# Patient Record
Sex: Female | Born: 1935
Health system: Southern US, Community
[De-identification: ages and names within clinical notes are randomized; demographics above are authoritative.]

## PROBLEM LIST (undated history)

## (undated) DIAGNOSIS — Z9981 Dependence on supplemental oxygen: Secondary | ICD-10-CM

## (undated) DIAGNOSIS — I447 Left bundle-branch block, unspecified: Secondary | ICD-10-CM

## (undated) DIAGNOSIS — M47812 Spondylosis without myelopathy or radiculopathy, cervical region: Secondary | ICD-10-CM

## (undated) DIAGNOSIS — K219 Gastro-esophageal reflux disease without esophagitis: Secondary | ICD-10-CM

## (undated) DIAGNOSIS — E785 Hyperlipidemia, unspecified: Secondary | ICD-10-CM

## (undated) DIAGNOSIS — E669 Obesity, unspecified: Secondary | ICD-10-CM

## (undated) DIAGNOSIS — I509 Heart failure, unspecified: Secondary | ICD-10-CM

## (undated) DIAGNOSIS — F419 Anxiety disorder, unspecified: Secondary | ICD-10-CM

## (undated) DIAGNOSIS — I2510 Atherosclerotic heart disease of native coronary artery without angina pectoris: Secondary | ICD-10-CM

## (undated) DIAGNOSIS — G43909 Migraine, unspecified, not intractable, without status migrainosus: Secondary | ICD-10-CM

## (undated) DIAGNOSIS — N183 Chronic kidney disease, stage 3 unspecified: Secondary | ICD-10-CM

## (undated) DIAGNOSIS — I10 Essential (primary) hypertension: Secondary | ICD-10-CM

## (undated) DIAGNOSIS — G4730 Sleep apnea, unspecified: Secondary | ICD-10-CM

## (undated) DIAGNOSIS — Z8719 Personal history of other diseases of the digestive system: Secondary | ICD-10-CM

## (undated) DIAGNOSIS — R001 Bradycardia, unspecified: Secondary | ICD-10-CM

## (undated) HISTORY — PX: CYSTOCELE REPAIR: SHX163

## (undated) HISTORY — PX: CATARACT EXTRACTION W/ INTRAOCULAR LENS  IMPLANT, BILATERAL: SHX1307

## (undated) HISTORY — PX: ABDOMINAL HYSTERECTOMY: SHX81

## (undated) SURGERY — Surgical Case
Anesthesia: *Unknown

---

## 2000-11-16 ENCOUNTER — Other Ambulatory Visit: Admission: RE | Admit: 2000-11-16 | Discharge: 2000-11-16 | Payer: Self-pay | Admitting: Gynecology

## 2002-07-15 ENCOUNTER — Ambulatory Visit (HOSPITAL_COMMUNITY): Admission: RE | Admit: 2002-07-15 | Discharge: 2002-07-16 | Payer: Self-pay | Admitting: Cardiology

## 2002-07-15 ENCOUNTER — Encounter: Payer: Self-pay | Admitting: Cardiology

## 2003-01-16 ENCOUNTER — Ambulatory Visit (HOSPITAL_COMMUNITY): Admission: RE | Admit: 2003-01-16 | Discharge: 2003-01-16 | Payer: Self-pay | Admitting: Cardiology

## 2003-01-16 ENCOUNTER — Encounter: Payer: Self-pay | Admitting: Cardiology

## 2003-01-20 ENCOUNTER — Encounter: Payer: Self-pay | Admitting: Cardiology

## 2003-01-20 ENCOUNTER — Ambulatory Visit (HOSPITAL_COMMUNITY): Admission: RE | Admit: 2003-01-20 | Discharge: 2003-01-20 | Payer: Self-pay | Admitting: Cardiology

## 2003-08-14 ENCOUNTER — Emergency Department (HOSPITAL_COMMUNITY): Admission: AD | Admit: 2003-08-14 | Discharge: 2003-08-14 | Payer: Self-pay | Admitting: Emergency Medicine

## 2003-08-14 ENCOUNTER — Encounter: Payer: Self-pay | Admitting: Emergency Medicine

## 2003-08-22 ENCOUNTER — Emergency Department (HOSPITAL_COMMUNITY): Admission: EM | Admit: 2003-08-22 | Discharge: 2003-08-22 | Payer: Self-pay | Admitting: Emergency Medicine

## 2005-05-12 ENCOUNTER — Other Ambulatory Visit: Admission: RE | Admit: 2005-05-12 | Discharge: 2005-05-12 | Payer: Self-pay | Admitting: Gynecology

## 2005-09-05 ENCOUNTER — Inpatient Hospital Stay (HOSPITAL_COMMUNITY): Admission: RE | Admit: 2005-09-05 | Discharge: 2005-09-07 | Payer: Self-pay | Admitting: Gynecology

## 2007-09-25 ENCOUNTER — Other Ambulatory Visit: Admission: RE | Admit: 2007-09-25 | Discharge: 2007-09-25 | Payer: Self-pay | Admitting: Gynecology

## 2009-04-14 ENCOUNTER — Ambulatory Visit: Payer: Self-pay | Admitting: Nurse Practitioner

## 2009-09-07 ENCOUNTER — Emergency Department (HOSPITAL_COMMUNITY): Admission: EM | Admit: 2009-09-07 | Discharge: 2009-09-07 | Payer: Self-pay | Admitting: Emergency Medicine

## 2009-09-12 ENCOUNTER — Encounter: Admission: RE | Admit: 2009-09-12 | Discharge: 2009-09-12 | Payer: Self-pay | Admitting: Neurology

## 2010-01-05 ENCOUNTER — Encounter: Admission: RE | Admit: 2010-01-05 | Discharge: 2010-01-05 | Payer: Self-pay | Admitting: Neurosurgery

## 2011-01-19 ENCOUNTER — Encounter: Payer: Self-pay | Admitting: Family Medicine

## 2011-01-27 ENCOUNTER — Ambulatory Visit: Payer: Self-pay | Admitting: Internal Medicine

## 2011-02-14 ENCOUNTER — Encounter: Payer: Self-pay | Admitting: Internal Medicine

## 2011-02-14 ENCOUNTER — Ambulatory Visit (INDEPENDENT_AMBULATORY_CARE_PROVIDER_SITE_OTHER): Payer: No Typology Code available for payment source | Admitting: Internal Medicine

## 2011-02-14 DIAGNOSIS — I2510 Atherosclerotic heart disease of native coronary artery without angina pectoris: Secondary | ICD-10-CM

## 2011-02-14 DIAGNOSIS — I10 Essential (primary) hypertension: Secondary | ICD-10-CM

## 2011-02-14 DIAGNOSIS — E785 Hyperlipidemia, unspecified: Secondary | ICD-10-CM

## 2011-02-14 DIAGNOSIS — F411 Generalized anxiety disorder: Secondary | ICD-10-CM

## 2011-02-14 DIAGNOSIS — F329 Major depressive disorder, single episode, unspecified: Secondary | ICD-10-CM

## 2011-02-14 DIAGNOSIS — I119 Hypertensive heart disease without heart failure: Secondary | ICD-10-CM | POA: Insufficient documentation

## 2011-02-14 DIAGNOSIS — F3289 Other specified depressive episodes: Secondary | ICD-10-CM

## 2011-02-24 NOTE — Assessment & Plan Note (Signed)
Summary: new / medicare,mutual of [place]/ok dr plot--pt rs'd/cd   Vital Signs:  Patient profile:   77 year old female Height:      64 inches Weight:      203 pounds BMI:     34.97 Temp:     98.2 degrees F oral Pulse rate:   68 / minute Pulse rhythm:   regular Resp:     16 per minute BP sitting:   120 / 60  (left arm) Cuff size:   regular  Vitals Entered By: Lanier Prude, Beverly Gust) (February 14, 2011 3:36 PM) CC: Est PCP Is Patient Diabetic? No   CC:  Est PCP.  History of Present Illness: Dr Dagoberto Ligas retired - new pt C/o HTN, CAD, depression - need to be addressed   Preventive Screening-Counseling & Management  Alcohol-Tobacco     Smoking Status: never  Caffeine-Diet-Exercise     Does Patient Exercise: no  Current Medications (verified): 1)  Simvastatin 80 Mg Tabs (Simvastatin) .Marland Kitchen.. 1 By Mouth Once Daily 2)  Atenolol 25 Mg Tabs (Atenolol) .Marland Kitchen.. 1 By Mouth Once Daily 3)  Aspir-Low 81 Mg Tbec (Aspirin) .Marland Kitchen.. 1 By Mouth Once Daily 4)  Estradiol 0.0375 Mg/24hr Ptwk (Estradiol) .Marland Kitchen.. 1 Patch Weekly 5)  Allegra Allergy 180 Mg Tabs (Fexofenadine Hcl) .Marland Kitchen.. 1 By Mouth Once Daily As Needed 6)  Enablex 7.5 Mg Xr24h-Tab (Darifenacin Hydrobromide) .Marland Kitchen.. 1 By Mouth Once Daily 7)  Citalopram Hydrobromide 20 Mg Tabs (Citalopram Hydrobromide) .Marland Kitchen.. 1 By Mouth Once Daily 8)  Mobic 7.5 Mg Tabs (Meloxicam) .Marland Kitchen.. 1 By Mouth Once Daily 9)  Prilosec 20 Mg Cpdr (Omeprazole) .Marland Kitchen.. 1 By Mouth Once Daily 10)  Meclizine Hcl 25 Mg Tabs (Meclizine Hcl) .Marland Kitchen.. 1 By Mouth Once Daily As Needed 11)  Xanax 0.5 Mg Tabs (Alprazolam) .Marland Kitchen.. 1 By Mouth Once Daily As Needed  Allergies (verified): 1)  ! Ampicillin (Ampicillin) 2)  ! Pcn 3)  ! Dilaudid (Hydromorphone Hcl)  Past History:  Past Medical History: Coronary artery disease STENT Dr Clarene Duke Hyperlipidemia Hypertension Urine incont Anxiety Depression  Past Surgical History: Appendectomy Hysterectomy partial 1970 Tonsillectomy Bladder tuck Dr  Nicholas Lose Colonosc 2005 Dr Kinnie Scales  Family History: CAD HTN S pacemaker S x 2 uterine ca and glioblastoma   Social History: Occupation: retired Production designer, theatre/television/film Retired Divorced Never Smoked Alcohol use-no Regular exercise-no Smoking Status:  never Does Patient Exercise:  no  Review of Systems       The patient complains of depression.  The patient denies anorexia, fever, weight loss, weight gain, vision loss, decreased hearing, hoarseness, chest pain, syncope, dyspnea on exertion, peripheral edema, prolonged cough, headaches, hemoptysis, abdominal pain, melena, hematochezia, severe indigestion/heartburn, hematuria, incontinence, genital sores, muscle weakness, suspicious skin lesions, transient blindness, difficulty walking, unusual weight change, abnormal bleeding, enlarged lymph nodes, angioedema, and breast masses.    Physical Exam  General:  overweight-appearing.   Head:  Normocephalic and atraumatic without obvious abnormalities. No apparent alopecia or balding. Eyes:  No corneal or conjunctival inflammation noted. EOMI. Perrla. Ears:  hard- hearing Nose:  External nasal examination shows no deformity or inflammation. Nasal mucosa are pink and moist without lesions or exudates. Mouth:  Oral mucosa and oropharynx without lesions or exudates.  Teeth in good repair. Neck:  No deformities, masses, or tenderness noted. Lungs:  Normal respiratory effort, chest expands symmetrically. Lungs are clear to auscultation, no crackles or wheezes. Heart:  Normal rate and regular rhythm. S1 and S2 normal without gallop, murmur, click, rub or other extra sounds.  Abdomen:  Bowel sounds positive,abdomen soft and non-tender without masses, organomegaly or hernias noted. Msk:  Lumbar-sacral spine is tender to palpation over paraspinal muscles and painfull with the ROM  Pulses:  R and L carotid,radial,femoral,dorsalis pedis and posterior tibial pulses are full and equal bilaterally Extremities:  no  edema Neurologic:  No cranial nerve deficits noted. Station and gait are normal. Plantar reflexes are down-going bilaterally. DTRs are symmetrical throughout. Sensory, motor and coordinative functions appear intact. Skin:  Intact without suspicious lesions or rashes Cervical Nodes:  No lymphadenopathy noted Inguinal Nodes:  No significant adenopathy Psych:  Cognition and judgment appear intact. Alert and cooperative with normal attention span and concentration. No apparent delusions, illusions, hallucinations   Impression & Recommendations:  Problem # 1:  HYPERTENSION (ICD-401.9) Assessment New  Her updated medication list for this problem includes:    Atenolol 25 Mg Tabs (Atenolol) .Marland Kitchen... 1 by mouth once daily  Problem # 2:  HYPERLIPIDEMIA (ICD-272.4) Assessment: New  Her updated medication list for this problem includes:    Simvastatin 80 Mg Tabs (Simvastatin) .Marland Kitchen... 1 by mouth once daily  Problem # 3:  CORONARY ARTERY DISEASE (ICD-414.00) Assessment: New  Her updated medication list for this problem includes:    Atenolol 25 Mg Tabs (Atenolol) .Marland Kitchen... 1 by mouth once daily    Aspir-low 81 Mg Tbec (Aspirin) .Marland Kitchen... 1 by mouth once daily  Problem # 4:  DEPRESSION (ICD-311) Assessment: New  The following medications were removed from the medication list:    Xanax 0.5 Mg Tabs (Alprazolam) .Marland Kitchen... 1 by mouth once daily as needed Her updated medication list for this problem includes:    Citalopram Hydrobromide 20 Mg Tabs (Citalopram hydrobromide) .Marland Kitchen... 1 by mouth once daily  Complete Medication List: 1)  Simvastatin 80 Mg Tabs (Simvastatin) .Marland Kitchen.. 1 by mouth once daily 2)  Atenolol 25 Mg Tabs (Atenolol) .Marland Kitchen.. 1 by mouth once daily 3)  Estradiol 0.0375 Mg/24hr Ptwk (Estradiol) .Marland Kitchen.. 1 patch weekly 4)  Enablex 7.5 Mg Xr24h-tab (Darifenacin hydrobromide) .Marland Kitchen.. 1 by mouth once daily 5)  Citalopram Hydrobromide 20 Mg Tabs (Citalopram hydrobromide) .Marland Kitchen.. 1 by mouth once daily 6)  Mobic 7.5 Mg Tabs  (Meloxicam) .Marland Kitchen.. 1 by mouth once daily 7)  Prilosec 20 Mg Cpdr (Omeprazole) .Marland Kitchen.. 1 by mouth once daily 8)  Meclizine Hcl 25 Mg Tabs (Meclizine hcl) .Marland Kitchen.. 1 by mouth once daily as needed 9)  Aspir-low 81 Mg Tbec (Aspirin) .Marland Kitchen.. 1 by mouth once daily 10)  Allegra Allergy 180 Mg Tabs (Fexofenadine hcl) .Marland Kitchen.. 1 by mouth once daily as needed 11)  Vitamin D 1000 Unit Tabs (Cholecalciferol) .Marland Kitchen.. 1 by mouth qd  Patient Instructions: 1)  Please schedule a follow-up appointment in 3 months well w/labs. Prescriptions: VITAMIN D 1000 UNIT TABS (CHOLECALCIFEROL) 1 by mouth qd  #100 x 3   Entered and Authorized by:   Tresa Garter MD   Signed by:   Tresa Garter MD on 02/14/2011   Method used:   Print then Give to Patient   RxID:   [id number] MOBIC 7.5 MG TABS (MELOXICAM) 1 by mouth once daily  #90 x 3   Entered and Authorized by:   Tresa Garter MD   Signed by:   Tresa Garter MD on 02/14/2011   Method used:   Print then Give to Patient   RxID:   [id number] PRILOSEC 20 MG CPDR (OMEPRAZOLE) 1 by mouth once daily  #90 x 3   Entered and Authorized by:  Tresa Garter MD   Signed by:   Tresa Garter MD on 02/14/2011   Method used:   Print then Give to Patient   RxID:   [id number] CITALOPRAM HYDROBROMIDE 20 MG TABS (CITALOPRAM HYDROBROMIDE) 1 by mouth once daily  #90 x 3   Entered and Authorized by:   Tresa Garter MD   Signed by:   Tresa Garter MD on 02/14/2011   Method used:   Print then Give to Patient   RxID:   [id number] ENABLEX 7.5 MG XR24H-TAB (DARIFENACIN HYDROBROMIDE) 1 by mouth once daily  #90 x 3   Entered and Authorized by:   Tresa Garter MD   Signed by:   Tresa Garter MD on 02/14/2011   Method used:   Print then Give to Patient   RxID:   [id number] ESTRADIOL 0.0375 MG/24HR PTWK (ESTRADIOL) 1 patch weekly  #12 x 3   Entered and Authorized by:   Tresa Garter MD   Signed by:   Tresa Garter MD on 02/14/2011   Method used:   Print then Give to Patient   RxID:   [id number] ATENOLOL 25 MG TABS (ATENOLOL) 1 by mouth once daily  #90 x 3   Entered and Authorized by:   Tresa Garter MD   Signed by:   Tresa Garter MD on 02/14/2011   Method used:   Print then Give to Patient   RxID:   [id number] SIMVASTATIN 80 MG TABS (SIMVASTATIN) 1 by mouth once daily  #90 x 3   Entered and Authorized by:   Tresa Garter MD   Signed by:   Tresa Garter MD on 02/14/2011   Method used:   Print then Give to Patient   RxID:   [id number]    Orders Added: 1)  New Patient Level IV [09323]

## 2011-03-21 ENCOUNTER — Telehealth: Payer: Self-pay | Admitting: *Deleted

## 2011-03-21 NOTE — Telephone Encounter (Signed)
rec rf req for Alprazolam 0.5mg   1 po bid # 180.... Med is not active in [facility]... Please advise ok to Rf?

## 2011-03-21 NOTE — Telephone Encounter (Signed)
OK to fill this prescription with additional refills x3 Thank you!  

## 2011-03-22 ENCOUNTER — Other Ambulatory Visit: Payer: Self-pay | Admitting: *Deleted

## 2011-03-22 MED ORDER — MELOXICAM 7.5 MG PO TABS: 7.5000 mg | ORAL_TABLET | Freq: Every day | ORAL | Status: DC

## 2011-03-22 MED ORDER — SIMVASTATIN 80 MG PO TABS: 80.0000 mg | ORAL_TABLET | Freq: Every evening | ORAL | Status: DC

## 2011-03-22 MED ORDER — ESTRADIOL 0.0375 MG/24HR TD PTTW: 1.0000 | MEDICATED_PATCH | TRANSDERMAL | Status: DC

## 2011-03-22 MED ORDER — CITALOPRAM HYDROBROMIDE 20 MG PO TABS: 20.0000 mg | ORAL_TABLET | Freq: Every day | ORAL | Status: DC

## 2011-03-22 MED ORDER — ATENOLOL 25 MG PO TABS: 25.0000 mg | ORAL_TABLET | Freq: Every day | ORAL | Status: DC

## 2011-03-22 MED ORDER — OMEPRAZOLE 20 MG PO CPDR: 20.0000 mg | DELAYED_RELEASE_CAPSULE | Freq: Every day | ORAL | Status: DC

## 2011-03-23 MED ORDER — ALPRAZOLAM 0.5 MG PO TABS: 0.5000 mg | ORAL_TABLET | Freq: Two times a day (BID) | ORAL | Status: DC

## 2011-04-08 ENCOUNTER — Telehealth: Payer: Self-pay | Admitting: *Deleted

## 2011-04-08 NOTE — Telephone Encounter (Signed)
OK to hold Eneblex OV on Sat or next wk w/UA Thx

## 2011-04-08 NOTE — Telephone Encounter (Signed)
Left vm for pt - also req she call office on monday

## 2011-04-08 NOTE — Telephone Encounter (Signed)
Pt is has been on enablex - she is c/o urinary leakage and being unable to urinate when is wants to.

## 2011-04-11 ENCOUNTER — Telehealth: Payer: Self-pay | Admitting: *Deleted

## 2011-04-11 NOTE — Telephone Encounter (Signed)
See other phone note, call completed

## 2011-04-11 NOTE — Telephone Encounter (Signed)
Pt aware, stopped med Friday and continues to have urinary "leakage" - Transferred to scheduler for ov to discuss

## 2011-04-11 NOTE — Telephone Encounter (Signed)
Pt returning call to Sarah re: Enabalex. Please call her back

## 2011-04-12 NOTE — Assessment & Plan Note (Signed)
NAME:  [name], [name]                ACCOUNT NO.:  [id number]   MEDICAL RECORD NO.:  [id number]          PATIENT TYPE:  POB   LOCATION:  [facility]         FACILITY:  [facility]   PHYSICIAN:  Tinnie Gens, MD        DATE OF BIRTH:  1932/03/07   DATE OF SERVICE:  04/14/2009                                  CLINIC NOTE   The patient comes to office today as a new headache consultation.  The  patient was seen by me in the past at [facility].  She  did see Dr. Neale Burly for a while and then switched over to Dr. Vela Prose.  She is a 77 year old female who has a history of hypertension, some  heart disease, high cholesterol, migraines, allergies, arthritis.   She is currently on 5-10 mg of Valium per day as well as the Xanax 0.5  mg 2-3 times per day.  The patient is seen from a cardiology standpoint  by Dr. Clarene Duke.  She is seen from a primary care standpoint by Dr.  Dagoberto Ligas.  She sees Dr. Nicholas Lose for problems with her bladder and he in fact  gives her the Valium.  Dr. Dagoberto Ligas has recently taken her off of her  Celexa and put her on Prozac.  Dr. Vela Prose has switched her for headaches  over to lidocaine nasal spray, lidocaine patches, and lidocaine  ointment.  He has not given her Ultram.  She did take Ultram from the  [facility].  Dr. Neale Burly limits her good quantity  significantly and Dr. Dagoberto Ligas will not give her any pain medication.  Approximately 10 days ago, the patient had a fall at home in the middle  of the night which was on some Saturday or Sunday night.  She did not go  to see Dr. Dagoberto Ligas until Tuesday, as he was quite concerned about her and  watched her very closely for approximately 3 weeks.  She did not have an  MRI or CT scan of her head.  Today, her request is for Ultram, which she  would like to have for her headaches.   ALLERGIES:  To penicillin, ampicillin, Dilaudid.   PHYSICAL EXAMINATION:  VITAL SIGNS:  Blood pressure is 150/72, weight  199,  height is 5 feet 4, pulse is 54.  GENERAL:  Well-developed, well-nourished 77 year old Caucasian female in  no acute distress.  NEUROLOGIC:  The patient appears slow.  She is alert, oriented.  She is  cooperative.  She seems a little unsure of herself.  Physically, she is  well coordinated upon standing and sitting.  She has good sensation.  CARDIAC:  Bradycardia, regular rate.  LUNGS:  Clear bilaterally.  EXTREMITIES:  Warm and dry.   ASSESSMENT:  Migraine, hypertension, elevated cholesterol, depression,  arthritis, and allergies.   PLAN:  We had a very lengthy discussion today.  I agree with Dr. Vela Prose  that she should not be given any pain medication, as she is already on  such high doses of Valium and Xanax.  The patient is still driving and  lives alone at home.  She did remember a certain medication she was  given at  the [facility], which was helpful for her  headaches.  We will request those medical records and see if we can  discover that medication, if so we will  call it in to the [facility] for her.  She is asked to  continue with her physicians for her various medical conditions if she  can see me on a p.r.n. basis.      Remonia Richter, NP    ______________________________  Tinnie Gens, MD    LR/MEDQ  D:  04/14/2009  T:  04/15/2009  Job:  [id number]

## 2011-04-15 NOTE — Discharge Summary (Signed)
[name], [name]                ACCOUNT NO.:  [id number]   MEDICAL RECORD NO.:  [id number]          PATIENT TYPE:  INP   LOCATION:  1613                         FACILITY:  [facility]   PHYSICIAN:  Gretta Cool, M.D. DATE OF BIRTH:  1932-11-13   DATE OF ADMISSION:  09/05/2005  DATE OF DISCHARGE:  09/07/2005                                 DISCHARGE SUMMARY   HISTORY OF PRESENT ILLNESS:  Ms. Caskey is a 77 year old female, gravida 3,  para 3 with a history of hysterectomy and cystocele repair in 1980 by Dr.  Jacelyn Grip.  She has a history of chronic constipation and has always had  a problem straining at stool.  In approximately 2001, she began to notice a  bulge from the vagina that became worse.  She has continued to have  incontinence with a full bladder and also with stress.  Wears a pad all of  the time.  She has now developed incontinence of stool and has had extensive  assessment of her pelvic floor.  She has exceedingly poor both internal and  external sphincter tone.  There is no anal wink or reflexes on the right or  the left.  She has diminished sensation that stool is present.  She denies  any history of obstetric trauma, but there is clear evidence the anterior  sphincter is disrupted.  There is posterior dove-tailing in the anus,  suggesting persistent weakness anteriorly.  She has managed to learn to live  with the difficulty by having a daily bowel movement in the morning before  she leaves, using bulk laxatives for daily bowel movement.  She has very  little in the way of gas.  She now wishes to proceed with definitive repair  of the enterocele and rectocele due to discomfort of the large bulge.  This  is more uncomfortable with standing.  Also having to slant the vagina to  achieve evacuation of the bowel.  Pessary has been tried in the past, but  that was not acceptable.  She is now admitted with definitive therapy by  posterior enterocele repairs and cardinal  uterosacral colposuspension.  She  understands that she may continue to leak at least from urge and that this  is a neurologic problem, as well as continue to have incomplete control of  the sphincter innervation.  She understands that cannot be altered.   PHYSICAL EXAMINATION:  CHEST:  Clear to A&P.  HEART:  Rate and rhythm are regular without murmur, gallop, or cardiac  enlargement.  ABDOMEN:  Soft without masses or megaly.  She does have a large panniculus  with a high abdominal to hip ratio.  PELVIC:  External genitalia within normal limits.  There is widening of the  genital hiatus with large rectocele and enterocele bulging through the  introitus in a supine position with stranding and bulges through the  introitus.  The anus is very weak, in fact, virtually no tone at all.  There  is exceedingly weak contraction of the external sphincter.  Virtually no  reflexes remain.  The cervix and uterus are surgically  absent.  Rectovaginal  confirms.   IMPRESSION:  1.  Pelvic organ prolapse with rectocele/enterocele, grade IV.  2.  Severe fascial detachment disorder.  3.  Incompetent sphincter of neurologic origin, both internal and external.  4.  History of chronic constipation, lifetime.  5.  Evidence of obstruction injury to the anal sphincter with possible      partial disruption.  6.  Urgent stress urinary incontinence.  7.  Hypertension.  8.  Dyslipidemia.   PLAN:  Posterior enterocele repair, cardinal uterosacral colposuspension,  suprapubic catheter for bladder drainage.  Risks and benefits have been  discussed with the patient, and she accepts these procedures.   LABORATORY DATA:  Admission hemoglobin 12.7, hematocrit 37.5.  The remainder  of her preoperative lab work within normal limits with the exception of a  slightly abnormal sodium at 129.   CHEST X-RAY:  There is some mild elevation of the right hemidiaphragm  relative to the left.  Some prominent epicardial fat is  noted on the left.  Lungs are clear.  No effusion.  The heart size is normal.   HOSPITAL COURSE:  The patient underwent posterior and enterocele repairs,  uterosacral cardinal colposuspension and Bonnano suprapubic catheter under  general anesthesia.  The procedures were completed without any complication,  and the patient was returned to the recovery room in excellent condition.  Her postoperative course was complicated by extreme sedation and not as  alert on Tylox.  She was discharged on the second postoperative day, much  improved off Tylox.  The suprapubic catheter will be removed in the office  at a later date.  She is discharged to home with instructions of no heavy  lifting or straining, no vaginal entrants, and increased ambulation as  tolerated.  She is to call for any fever over 100.5 or failure of daily  improvement.   DIET:  Regular.   MEDICATIONS:  Preoperative meds, as prior to surgery.  She is to follow up  in the office in one week to remove the catheter and have her postoperative  exam.   FINAL DISCHARGE DIAGNOSES:  Pelvic organ prolapse with a grade IV rectocele,  enterocele, and vaginal vault descensus.   PROCEDURE:  Posterior and enterocele repairs, uterosacral cardinal  colposuspension, Bonnano suprapubic catheter insertion under general  anesthesia.      Matt Holmes, N.P.    ______________________________  Gretta Cool, M.D.    EMK/MEDQ  D:  10/18/2005  T:  10/18/2005  Job:  [id number]   cc:   Griffith Citron, M.D.  Fax: [phone]   Alfonse Alpers. Dagoberto Ligas, M.D.  Fax: [phone]

## 2011-04-15 NOTE — Op Note (Signed)
[name], [name]                ACCOUNT NO.:  [id number]   MEDICAL RECORD NO.:  [id number]          PATIENT TYPE:  INP   LOCATION:  0002                         FACILITY:  [facility]   PHYSICIAN:  Gretta Cool, M.D. DATE OF BIRTH:  [birth year]   DATE OF PROCEDURE:  09/05/2005  DATE OF DISCHARGE:                                 OPERATIVE REPORT   PREOPERATIVE DIAGNOSIS:  Pelvic organ prolapse with grade 4 rectocele,  enterocele, and vaginal vault descensus.   POSTOPERATIVE DIAGNOSIS:  Pelvic organ prolapse with grade 4 rectocele,  enterocele, and vaginal vault descensus.   SURGEON:  Gretta Cool, M.D.   ASSISTANT:  Almedia Balls. Randell Patient, M.D.   PROCEDURE:  Posterior and enterocele repairs, uterosacral cardinal  colposuspension, Bonnano suprapubic catheter.   DESCRIPTION OF PROCEDURE:  Under excellent general anesthesia with the  patient prepped and draped in lithotomy position in [place] stirrups, the  vaginal mucosa was grasped at the introitus and the pelvic floor evaluated.  The anterior pelvic floor seemed well supported. Posteriorly, there was an  enormous grade 4 enterocele and rectocele. The rectal mucosa was infiltrated  with Xylocaine with epinephrine 1:200,000. The mucosa was then incised at  the introitus, a V-shaped incision made, and the intervening tissue removed  down the perineal body. The mucosa was then incised all the way to the apex  of the vagina, the enormous enterocele pulled through the fascia. The mucosa  was exceedingly thin-walled with very little fascia remaining. There was  complete detachment of the central portion of the posterior rectovaginal  septum and the fascia had slipped all the way to the introitus. The fascia  was extremely attenuated. At this point, the mucosa was dissected from the  perirectal fascia and reflected as far as possible to its physiologic  attachments. At this point, the cardinal uterosacral complex was identified  with Allis  clamps and a suture of 0 Novofil placed through the uterosacral  cardinal complex, and then secured with a mattress closure to the perirectal  fascia. The fascia was then pulled up to the uterosacral cardinal complex  from its very detached and displaced position back to its original location.  The perirectal fascia was then secured to the apex of the vaginal cuff so as  to create a complete envelope of strong fascia from uterosacral complex on  the right to the left. Once the cardinal uterosacral fascia had been  reattached to the apex of the vagina and the vaginal cuff and the  uterosacral complex, a suture of 0 Vicryl was used to plicate the central  portion of the fascia so as to prevent subsequent weakening in another site  yet unseen where there is fascial weakness. At this point, the mucosa was  trimmed and the upper layers of endopelvic fascia and mucosa closed in a  running suture of 2-0 Vicryl from the apex of the vaginal mucosa to near the  introitus. At this point, the suture was tied and the attention turned to  repairing the perineal body musculature and further plication of the levator  fascia. The levator fascia  was plicated so as to support the rectum and  prevent its anterior bulging with attempts at bowel passage. The perineal  body muscles were then reattached with interrupted sutures of 2-0 Vicryl. At  this point, the mucosa was trimmed further and the lower third of the vagina  closed with a running suture of 2-0 Vicryl. The perineal body skin was  closed with interrupted sutures of 2-0 Vicryl. At this point, the bladder  was filled with saline at 300 cc and a Bonnano suprapubic Cystocath placed.  The catheter was secured with 2-0 Novofil. At the end of the procedure the  sponge and  lap counts were correct, there were no complications. The introitus was a  very snug bidigital with excellent length and depth. The procedure was  terminated without complication. The  patient returned to the recovery room  in excellent condition.           ______________________________  Gretta Cool, M.D.     CWL/MEDQ  D:  09/05/2005  T:  09/05/2005  Job:  [id number]   cc:   Griffith Citron, M.D.  Fax: [phone]   Alfonse Alpers. Dagoberto Ligas, M.D.  Fax: [phone]

## 2011-04-15 NOTE — Cardiovascular Report (Signed)
   NAME:  [name], [name]                          ACCOUNT NO.:  [id number]   MEDICAL RECORD NO.:  [id number]                   PATIENT TYPE:  OIB   LOCATION:  NA                                   FACILITY:  [facility]   PHYSICIAN:  Thereasa Solo. Little, M.D.              DATE OF BIRTH:  May 04, 1932   DATE OF PROCEDURE:  07/15/2002  DATE OF DISCHARGE:                              CARDIAC CATHETERIZATION   INDICATIONS FOR PROCEDURE:  The patient is a 77 year old female who has had  chest pain and underwent a diagnostic cardiac catheterization at the  [facility] that showed a 50-60% area of narrowing in her mid  LAD. Because of her continued complaints of chest discomfort despite medical  therapy, she underwent a nuclear study that showed anterior ischemia and is  brought in for primary stenting to her LAD.   DESCRIPTION OF PROCEDURE:  The patient was prepped and draped in the usual  sterile fashion exposing the right groin. Following local anesthetic with 1%  Xylocaine, the Seldinger technique was employed and a 7 [place] introducer  sheath was placed into the right femoral artery.  A JL4 7 [place] guide  catheter, a short luge and a 2.5 x 13 Cypher stent was used. The guide wire  was placed down the distal portion of the LAD.  The stent was placed in such  a manner that both the proximal and distal portions of the area that was  narrowed was well covered and it was initially deployed at 10 atmospheres  for 62 seconds with a final inflation being 13 atmospheres for 65 seconds.   On the original guide shots, the area after the diagonal that had been 50%  narrowed earlier appeared to be more in the 70-80% range.  The distal vessel  was free of disease. There was minor irregularities in the LAD. The  circumflex from the previous catheterization came off aberrantly from the  right coronary artery.   After a primary stenting the previously 80% narrowed area is now normal with  no  dissection or thrombosis formation. There was breast TIMI-3 flow. Her  blood pressure during the procedure was 158/63.   Angiomax was used during the procedure and was terminated at the termination  of the procedure. She was given 300 mg of oral Plavix. She should be ready  for discharge in the morning.                                                 Thereasa Solo. Little, M.D.    ABL/MEDQ  D:  07/15/2002  T:  07/16/2002  Job:  [id number]   cc:   Cardiac Catheterization Laboratory   Alfonse Alpers. Dagoberto Ligas, M.D.

## 2011-04-15 NOTE — Cardiovascular Report (Signed)
NAME:  [name], [name]                          ACCOUNT NO.:  [id number]   MEDICAL RECORD NO.:  [id number]                   PATIENT TYPE:  OIB   LOCATION:  2858                                 FACILITY:  [facility]   PHYSICIAN:  Thereasa Solo. Little, M.D.              DATE OF BIRTH:  Aug 25, 1932   DATE OF PROCEDURE:  DATE OF DISCHARGE:                              CARDIAC CATHETERIZATION   INDICATIONS FOR TEST:  The patient is a 77 year old female.  She had a  Cypher stent to her LAD 07/15/02.  She presented to my office about one week  earlier complaining of atypical chest and left arm pain.  A nuclear study  was performed that showed some inferolateral ischemia.  Because of the  abnormality on her stress test and chest pain complaints, she was brought to  the catheterization laboratory for repeat cardiac catheterization.   DESCRIPTION OF PROCEDURE:  The patient was prepped and draped in the usual  sterile fashion exposing her right groin.  She was administered local  anesthetic with 1% Xylocaine.  The Seldinger technique was employed, and a 5-  [place] introducer sheath was placed into the right femoral artery. Left and  right coronary arteriography and ventriculography in the RAO projection was  performed.   EQUIPMENT:  A 5-French Judkins __________ catheter.   RESULTS:  1. Hemodynamic monitoring.  Central aortic pressure 157/67, left ventricular     pressure 153/13 with no significant aortic valve gradient at the time of     pullback.  2. Ventriculography in the RAO projection using 25 mL of contrast at 12 mL     per second revealed normal LV systolic function.  The ejection fraction     was greater than 60%.  End diastolic pressure was 21.   CORONARY ARTERIOGRAPHY:  A stent was seen in the distribution of the  proximal LAD on fluoroscopy.  1. Left main:  Normal.  2. LAD:  The stent was in the proximal portion of the LAD, and it was widely     patent.  The LAD itself was free of  disease, and extended down to the     apex of the heart.  3. Optional diagonal:  Mild irregularity though less than 40%.  4. Circumflex:  Small vessel, free of disease.  5. Right coronary artery:  The right coronary artery is a large dominant     vessel with a big PDA and a large posterolateral branch.  There were     minimal irregularities in this vessel.   CONCLUSION:  1. No evidence of restenosis within the stent, and minimal disease in the     optional diagonal and right coronary artery.  2. Normal left ventricular systolic function.    PLAN:  1. Same medications.  2. Home later today.  Thereasa Solo. Little, M.D.    ABL/MEDQ  D:  01/20/2003  T:  01/20/2003  Job:  [id number]   cc:   Cardiac Cath Lab   Alfonse Alpers. Dagoberto Ligas, M.D.  [address]  [place] [address]  Fax: [phone]

## 2011-04-15 NOTE — H&P (Signed)
[name], [name]                ACCOUNT NO.:  [id number]   MEDICAL RECORD NO.:  [id number]          PATIENT TYPE:  INP   LOCATION:  0002                         FACILITY:  [facility]   PHYSICIAN:  Gretta Cool, M.D. DATE OF BIRTH:  Jul 27, 1932   DATE OF ADMISSION:  09/05/2005  DATE OF DISCHARGE:                                HISTORY & PHYSICAL   CHIEF COMPLAINT:  Pelvic organ prolapse.   HISTORY OF PRESENT ILLNESS:  77 year old gravida 3, para 3 with history of  hysterectomy and cystocele repair by Dr. Jacelyn Grip in [place]  approximately 1980. She has had chronic constipation all of her life and has  always had a problem straining at stool. Approximately 2001 she began to  note a bulge from her vagina and that has become increasingly worse. She has  continued to have incontinence with full bladder. Also with stress. She  describes both urge and stress incontinence, wears a pad all of the time.   She also has now developed incontinence of stool. She has had extensive  assessment of her pelvic floor. She has exceedingly poor both internal and  external sphincter tone. She has virtually no anal wink or reflexes, either  from the left or right. She also has diminished sensation that stool is  present. She denies any known history of obstetric trauma but has clear  evidence of previous anterior sphincter disruption. There is posterior  dovetailing of the anal mucosa suggesting a persistent weakness anteriorly.  We had previously discussed the lack of any successful repair when there is  no innervation of the anal sphincter and that she will continue to have  incontinence of stool. She has managed to learn to live with her  difficulties by having one daily bowel movement in the morning before she  leaves home using a bulk fiber laxative along with adequate doses of MiraLax  to achieve a daily bowel passage. She has very little in the way of the gas.  Uses Beano when she is out for  social occasions but her life is pretty much  recovered so far as being able to be social and avoid embarrassment of  having soiling or incontinence of gas. She now wishes to proceed to  definitive repair of her enterocele and rectocele because the discomfort  with the bulge with prolonged standing, also because of the unpleasantness  of having to splint her vagina to achieve evacuation of her bowel. She has  tried pessary and been unhappy with that. She is now admitted for definitive  therapy by posterior enterocele repairs and cardinal uterosacral  colposuspension. She understands that she will continue leakage at least  from urge incontinence and that is a neurologic problem as well as continued  having incomplete control because of inadequate anal sphincter innervation.  She is aware that that cannot be altered.   PAST MEDICAL HISTORY:  Usual childhood disease without sequelae.   MEDICAL ILLNESSES:  The patient has hypertension on multi agent control  using lisinopril, atenolol. She also has dyslipidemia on Zocor. She also has  depression for which she takes  multiple medications. She has been on oral  estrogen for years, has declined switch to patch. I have stressed the  importance of preventing risk of clot and stroke by switching to patch  rather than pill.   PRESENT MEDICATIONS:  1.  Lisinopril and atenolol for hypertension as above.  2.  Furosemide 20 milligrams daily  3.  Zocor 80 milligrams daily for dyslipidemia  4.  Lexapro 10 milligrams daily for depression  5.  Diazepam 5 milligrams twice daily for anxiety  6.  Prilosec OTC for gastric irritation and reflux  7.  Migraine medications intermittently.   ALLERGIES:  Allergic to PENICILLIN and DILAUDID.   REVIEW OF SYSTEMS:  HEENT: Denies symptoms. CARDIORESPIRATORY:  Denies  asthma, cough, bronchitis or shortness of breath. GI: Incontinence of stool  and gas. GU: Both urge and stress incontinence all likely of  neurologic  origin. She has previous history of cystocele repair and hysterectomy done  in 1980s. She has grade 4 prolapse of her posterior vaginal wall and apex  with severe detachment disorder.   SOCIAL HISTORY:  First marriage ended in divorce 40 years ago. Second  marriage of 22 years ended in divorce approximately 21 years ago. The  patient is retired. She lives alone. She is on multiple medications for  anxiety and depression. Her exercise consists of occasionally walking, but  she is not very active partly because of her current medical condition.   FAMILY HISTORY:  Both mother and father died of cardiac disease. One sister  died at age 38 of cancer. Two sisters living with heart disease. One brother  age 33 also living with heart disease, hypertension. She has three children,  two daughters and one son.   PHYSICAL EXAM:  GENERAL:  Well-developed, well-nourished white female,  considerably over ideal body weight with high abdomen/hip ratio. Her current  weight is 189 pounds.  HEENT: Pupils equal, react to light and accommodate. Fundi benign.  Oropharynx clear.  NECK:  Supple without masses or thyroid enlargement.  CHEST:  Clear PTA.  BREASTS:  Without masses, nodes, or nipple discharge.  HEART:  Regular rhythm without murmur, cardiac enlargement.  ABDOMEN:  Soft without mass or organomegaly. She does have a large  panniculus with high abdomen to hip ratio.  PELVIC:  External genitalia normal female. There is widening of the genital  hiatus with a large rectocele and enterocele bulging through the introitus  in the supine position with straining and bulges well through the introitus.  Her anus has a very weak tone that is virtually all internal. There is  exceedingly weak contraction of her external sphincter. Virtually no  reflexes remained. Her cervix and uterus are surgically absent. Rectovaginal  confirms.  EXTREMITIES:  Negative.  NEUROLOGY:   Physiologic.  IMPRESSION:  1.  Pelvic organ prolapse with rectocele, enterocele grade 4  2.  Severe fascial detachment disorder  3.  Incompetent anal sphincter of neurologic origin both internal and      external  4.  History of chronic constipation with lifelong straining at stool  5.  Evidence of obstetric injury to the anal sphincter with possible partial      disruption  6.  Urge and stress and urinary incontinence  7.  Hypertension  8.  Dyslipidemia.   PLAN:  Posterior enterocele repairs, cardinal uterosacral colposuspension,  suprapubic cystic catheter for bladder drainage.           ______________________________  Gretta Cool, M.D.     CWL/MEDQ  D:  09/05/2005  T:  09/05/2005  Job:  [id number]   cc:   Griffith Citron, M.D.  Fax: [phone]   Alfonse Alpers. Dagoberto Ligas, M.D.  Fax: [phone]

## 2011-05-25 ENCOUNTER — Encounter: Payer: No Typology Code available for payment source | Admitting: Internal Medicine

## 2011-08-26 ENCOUNTER — Other Ambulatory Visit: Payer: Self-pay | Admitting: Internal Medicine

## 2011-08-26 ENCOUNTER — Telehealth: Payer: Self-pay | Admitting: *Deleted

## 2011-08-26 NOTE — Telephone Encounter (Signed)
OK to fill this prescription with additional refills x2 Thank you!  

## 2011-08-26 NOTE — Telephone Encounter (Signed)
Requested Medications     ALPRAZolam (XANAX) 0.5 MG tablet [Pharmacy Med Name: ALPRAZOLAM TAB 0.5MG ]   TAKE 1 TABLET TWICE DAILY   Disp: 180 each R: 1 Start: 08/26/2011  Class: Normal   Originally ordered on: 03/23/2011  Last refill: 08/25/2011

## 2011-08-29 MED ORDER — ALPRAZOLAM 0.5 MG PO TABS: 0.5000 mg | ORAL_TABLET | Freq: Two times a day (BID) | ORAL | Status: DC

## 2011-08-29 NOTE — Telephone Encounter (Signed)
rx faxed to pharmacy

## 2011-11-14 ENCOUNTER — Other Ambulatory Visit: Payer: Self-pay

## 2011-11-14 NOTE — Telephone Encounter (Signed)
Pt called requesting refill, please advise.

## 2012-03-05 ENCOUNTER — Other Ambulatory Visit: Payer: Self-pay | Admitting: Internal Medicine

## 2012-03-07 ENCOUNTER — Other Ambulatory Visit: Payer: Self-pay | Admitting: *Deleted

## 2012-03-07 MED ORDER — MELOXICAM 7.5 MG PO TABS: 7.5000 mg | ORAL_TABLET | Freq: Every day | ORAL | Status: DC

## 2012-03-07 MED ORDER — CITALOPRAM HYDROBROMIDE 20 MG PO TABS: 20.0000 mg | ORAL_TABLET | Freq: Every day | ORAL | Status: DC

## 2012-03-07 MED ORDER — ESTRADIOL 0.0375 MG/24HR TD PTWK: 1.0000 | MEDICATED_PATCH | TRANSDERMAL | Status: DC

## 2012-06-05 ENCOUNTER — Encounter: Payer: No Typology Code available for payment source | Admitting: Internal Medicine

## 2012-06-06 ENCOUNTER — Encounter: Payer: No Typology Code available for payment source | Admitting: Internal Medicine

## 2012-09-19 ENCOUNTER — Ambulatory Visit: Payer: Medicare Other | Admitting: Internal Medicine

## 2012-11-12 ENCOUNTER — Ambulatory Visit: Payer: Medicare Other | Admitting: Internal Medicine

## 2012-11-12 DIAGNOSIS — Z0289 Encounter for other administrative examinations: Secondary | ICD-10-CM

## 2012-12-05 ENCOUNTER — Other Ambulatory Visit: Payer: Self-pay | Admitting: Internal Medicine

## 2012-12-08 ENCOUNTER — Other Ambulatory Visit: Payer: Self-pay | Admitting: Internal Medicine

## 2012-12-10 ENCOUNTER — Other Ambulatory Visit: Payer: Self-pay | Admitting: Internal Medicine

## 2013-01-15 ENCOUNTER — Ambulatory Visit: Payer: Medicare Other | Admitting: Gynecology

## 2013-01-24 ENCOUNTER — Ambulatory Visit: Payer: Medicare Other | Admitting: Gynecology

## 2013-01-31 ENCOUNTER — Ambulatory Visit: Payer: Medicare Other | Admitting: Gynecology

## 2013-02-19 ENCOUNTER — Ambulatory Visit: Payer: Medicare Other | Admitting: Gynecology

## 2013-02-27 DIAGNOSIS — Z9861 Coronary angioplasty status: Secondary | ICD-10-CM | POA: Diagnosis not present

## 2013-02-27 DIAGNOSIS — I2510 Atherosclerotic heart disease of native coronary artery without angina pectoris: Secondary | ICD-10-CM | POA: Diagnosis not present

## 2013-02-27 DIAGNOSIS — I447 Left bundle-branch block, unspecified: Secondary | ICD-10-CM | POA: Diagnosis not present

## 2013-02-27 DIAGNOSIS — R0602 Shortness of breath: Secondary | ICD-10-CM | POA: Diagnosis not present

## 2013-02-28 ENCOUNTER — Ambulatory Visit: Payer: Self-pay | Admitting: Gynecology

## 2013-03-08 ENCOUNTER — Ambulatory Visit: Payer: Self-pay | Admitting: Gynecology

## 2013-03-20 DIAGNOSIS — I2510 Atherosclerotic heart disease of native coronary artery without angina pectoris: Secondary | ICD-10-CM | POA: Diagnosis not present

## 2013-03-20 DIAGNOSIS — R0602 Shortness of breath: Secondary | ICD-10-CM | POA: Diagnosis not present

## 2013-03-22 DIAGNOSIS — I4430 Unspecified atrioventricular block: Secondary | ICD-10-CM | POA: Diagnosis not present

## 2013-03-22 DIAGNOSIS — I2510 Atherosclerotic heart disease of native coronary artery without angina pectoris: Secondary | ICD-10-CM | POA: Diagnosis not present

## 2013-03-22 DIAGNOSIS — R0602 Shortness of breath: Secondary | ICD-10-CM | POA: Diagnosis not present

## 2013-03-26 ENCOUNTER — Ambulatory Visit: Payer: Self-pay | Admitting: Gynecology

## 2013-04-01 DIAGNOSIS — Z9861 Coronary angioplasty status: Secondary | ICD-10-CM | POA: Diagnosis not present

## 2013-04-01 DIAGNOSIS — I2510 Atherosclerotic heart disease of native coronary artery without angina pectoris: Secondary | ICD-10-CM | POA: Diagnosis not present

## 2013-04-01 DIAGNOSIS — R0602 Shortness of breath: Secondary | ICD-10-CM | POA: Diagnosis not present

## 2013-04-01 DIAGNOSIS — G4700 Insomnia, unspecified: Secondary | ICD-10-CM | POA: Diagnosis not present

## 2013-04-09 ENCOUNTER — Ambulatory Visit: Payer: Self-pay | Admitting: Gynecology

## 2013-08-09 ENCOUNTER — Ambulatory Visit: Payer: Medicare Other | Admitting: Internal Medicine

## 2013-11-08 ENCOUNTER — Encounter: Payer: Medicare Other | Admitting: Internal Medicine

## 2014-02-05 DIAGNOSIS — R5381 Other malaise: Secondary | ICD-10-CM | POA: Diagnosis not present

## 2014-02-05 DIAGNOSIS — Z9861 Coronary angioplasty status: Secondary | ICD-10-CM | POA: Diagnosis not present

## 2014-02-05 DIAGNOSIS — R5383 Other fatigue: Secondary | ICD-10-CM | POA: Diagnosis not present

## 2014-02-05 DIAGNOSIS — I447 Left bundle-branch block, unspecified: Secondary | ICD-10-CM | POA: Diagnosis not present

## 2014-02-05 DIAGNOSIS — I2510 Atherosclerotic heart disease of native coronary artery without angina pectoris: Secondary | ICD-10-CM | POA: Diagnosis not present

## 2014-02-10 DIAGNOSIS — E559 Vitamin D deficiency, unspecified: Secondary | ICD-10-CM | POA: Diagnosis not present

## 2014-04-16 DIAGNOSIS — Z9861 Coronary angioplasty status: Secondary | ICD-10-CM | POA: Diagnosis not present

## 2014-04-16 DIAGNOSIS — I10 Essential (primary) hypertension: Secondary | ICD-10-CM | POA: Diagnosis not present

## 2014-04-16 DIAGNOSIS — I2510 Atherosclerotic heart disease of native coronary artery without angina pectoris: Secondary | ICD-10-CM | POA: Diagnosis not present

## 2014-04-16 DIAGNOSIS — R5381 Other malaise: Secondary | ICD-10-CM | POA: Diagnosis not present

## 2014-04-16 DIAGNOSIS — R5383 Other fatigue: Secondary | ICD-10-CM | POA: Diagnosis not present

## 2014-04-17 DIAGNOSIS — G4710 Hypersomnia, unspecified: Secondary | ICD-10-CM | POA: Diagnosis not present

## 2014-06-06 DIAGNOSIS — I519 Heart disease, unspecified: Secondary | ICD-10-CM | POA: Diagnosis not present

## 2014-06-06 DIAGNOSIS — I10 Essential (primary) hypertension: Secondary | ICD-10-CM | POA: Diagnosis not present

## 2014-06-06 DIAGNOSIS — E785 Hyperlipidemia, unspecified: Secondary | ICD-10-CM | POA: Diagnosis not present

## 2014-06-06 DIAGNOSIS — I498 Other specified cardiac arrhythmias: Secondary | ICD-10-CM | POA: Diagnosis not present

## 2014-06-10 DIAGNOSIS — I2510 Atherosclerotic heart disease of native coronary artery without angina pectoris: Secondary | ICD-10-CM | POA: Diagnosis not present

## 2014-06-10 DIAGNOSIS — R5381 Other malaise: Secondary | ICD-10-CM | POA: Diagnosis not present

## 2014-06-10 DIAGNOSIS — R5383 Other fatigue: Secondary | ICD-10-CM | POA: Diagnosis not present

## 2014-06-10 DIAGNOSIS — G4734 Idiopathic sleep related nonobstructive alveolar hypoventilation: Secondary | ICD-10-CM | POA: Diagnosis not present

## 2014-06-10 DIAGNOSIS — Z9861 Coronary angioplasty status: Secondary | ICD-10-CM | POA: Diagnosis not present

## 2014-06-16 ENCOUNTER — Institutional Professional Consult (permissible substitution): Payer: Self-pay | Admitting: Neurology

## 2014-06-16 ENCOUNTER — Telehealth: Payer: Self-pay | Admitting: *Deleted

## 2014-06-16 NOTE — Telephone Encounter (Signed)
Left message at pt's home #: Appointment has been cancelled due to Dr Rexene Alberts being out sick.

## 2014-06-16 NOTE — Telephone Encounter (Signed)
Spoke with pt's son (emergency contact) and relayed that his mothers appointment has been cancelled due to Dr Rexene Alberts being out sick.

## 2014-07-10 ENCOUNTER — Institutional Professional Consult (permissible substitution): Payer: Self-pay | Admitting: Neurology

## 2014-07-24 ENCOUNTER — Encounter: Payer: Self-pay | Admitting: Neurology

## 2014-11-27 ENCOUNTER — Inpatient Hospital Stay (HOSPITAL_COMMUNITY)
Admission: EM | Admit: 2014-11-27 | Discharge: 2014-12-02 | DRG: 309 | Disposition: A | Discharge status: Home / Self Care | Payer: Medicare Other | Attending: Internal Medicine | Admitting: Internal Medicine

## 2014-11-27 ENCOUNTER — Encounter (HOSPITAL_COMMUNITY): Payer: Self-pay | Admitting: Emergency Medicine

## 2014-11-27 ENCOUNTER — Emergency Department (HOSPITAL_COMMUNITY): Payer: Medicare Other

## 2014-11-27 DIAGNOSIS — I2510 Atherosclerotic heart disease of native coronary artery without angina pectoris: Secondary | ICD-10-CM | POA: Diagnosis present

## 2014-11-27 DIAGNOSIS — R739 Hyperglycemia, unspecified: Secondary | ICD-10-CM | POA: Diagnosis present

## 2014-11-27 DIAGNOSIS — E785 Hyperlipidemia, unspecified: Secondary | ICD-10-CM | POA: Diagnosis present

## 2014-11-27 DIAGNOSIS — Z7982 Long term (current) use of aspirin: Secondary | ICD-10-CM

## 2014-11-27 DIAGNOSIS — Z6829 Body mass index (BMI) 29.0-29.9, adult: Secondary | ICD-10-CM | POA: Diagnosis not present

## 2014-11-27 DIAGNOSIS — F329 Major depressive disorder, single episode, unspecified: Secondary | ICD-10-CM | POA: Diagnosis present

## 2014-11-27 DIAGNOSIS — I499 Cardiac arrhythmia, unspecified: Secondary | ICD-10-CM | POA: Diagnosis not present

## 2014-11-27 DIAGNOSIS — I348 Other nonrheumatic mitral valve disorders: Secondary | ICD-10-CM | POA: Diagnosis not present

## 2014-11-27 DIAGNOSIS — I10 Essential (primary) hypertension: Secondary | ICD-10-CM | POA: Diagnosis not present

## 2014-11-27 DIAGNOSIS — I5032 Chronic diastolic (congestive) heart failure: Secondary | ICD-10-CM | POA: Diagnosis present

## 2014-11-27 DIAGNOSIS — F411 Generalized anxiety disorder: Secondary | ICD-10-CM | POA: Diagnosis present

## 2014-11-27 DIAGNOSIS — R0602 Shortness of breath: Secondary | ICD-10-CM | POA: Diagnosis not present

## 2014-11-27 DIAGNOSIS — R5383 Other fatigue: Secondary | ICD-10-CM | POA: Diagnosis not present

## 2014-11-27 DIAGNOSIS — I509 Heart failure, unspecified: Secondary | ICD-10-CM | POA: Diagnosis not present

## 2014-11-27 DIAGNOSIS — I481 Persistent atrial fibrillation: Secondary | ICD-10-CM | POA: Diagnosis not present

## 2014-11-27 DIAGNOSIS — I119 Hypertensive heart disease without heart failure: Secondary | ICD-10-CM | POA: Diagnosis present

## 2014-11-27 DIAGNOSIS — R634 Abnormal weight loss: Secondary | ICD-10-CM | POA: Diagnosis present

## 2014-11-27 DIAGNOSIS — I447 Left bundle-branch block, unspecified: Secondary | ICD-10-CM | POA: Diagnosis present

## 2014-11-27 DIAGNOSIS — I129 Hypertensive chronic kidney disease with stage 1 through stage 4 chronic kidney disease, or unspecified chronic kidney disease: Secondary | ICD-10-CM | POA: Diagnosis present

## 2014-11-27 DIAGNOSIS — G43909 Migraine, unspecified, not intractable, without status migrainosus: Secondary | ICD-10-CM | POA: Diagnosis present

## 2014-11-27 DIAGNOSIS — I4891 Unspecified atrial fibrillation: Secondary | ICD-10-CM | POA: Diagnosis present

## 2014-11-27 DIAGNOSIS — R5382 Chronic fatigue, unspecified: Secondary | ICD-10-CM | POA: Diagnosis not present

## 2014-11-27 DIAGNOSIS — E860 Dehydration: Secondary | ICD-10-CM | POA: Diagnosis present

## 2014-11-27 DIAGNOSIS — B961 Klebsiella pneumoniae [K. pneumoniae] as the cause of diseases classified elsewhere: Secondary | ICD-10-CM | POA: Diagnosis present

## 2014-11-27 DIAGNOSIS — K219 Gastro-esophageal reflux disease without esophagitis: Secondary | ICD-10-CM | POA: Diagnosis present

## 2014-11-27 DIAGNOSIS — R008 Other abnormalities of heart beat: Secondary | ICD-10-CM | POA: Diagnosis not present

## 2014-11-27 DIAGNOSIS — N179 Acute kidney failure, unspecified: Secondary | ICD-10-CM | POA: Diagnosis present

## 2014-11-27 DIAGNOSIS — R531 Weakness: Secondary | ICD-10-CM | POA: Diagnosis not present

## 2014-11-27 DIAGNOSIS — R0600 Dyspnea, unspecified: Secondary | ICD-10-CM | POA: Diagnosis present

## 2014-11-27 DIAGNOSIS — N390 Urinary tract infection, site not specified: Secondary | ICD-10-CM | POA: Diagnosis present

## 2014-11-27 DIAGNOSIS — I480 Paroxysmal atrial fibrillation: Secondary | ICD-10-CM | POA: Diagnosis not present

## 2014-11-27 DIAGNOSIS — Z880 Allergy status to penicillin: Secondary | ICD-10-CM | POA: Diagnosis not present

## 2014-11-27 DIAGNOSIS — I454 Nonspecific intraventricular block: Secondary | ICD-10-CM | POA: Diagnosis not present

## 2014-11-27 DIAGNOSIS — B9689 Other specified bacterial agents as the cause of diseases classified elsewhere: Secondary | ICD-10-CM | POA: Diagnosis present

## 2014-11-27 DIAGNOSIS — F419 Anxiety disorder, unspecified: Secondary | ICD-10-CM | POA: Diagnosis present

## 2014-11-27 DIAGNOSIS — I4819 Other persistent atrial fibrillation: Secondary | ICD-10-CM | POA: Diagnosis present

## 2014-11-27 DIAGNOSIS — M25511 Pain in right shoulder: Secondary | ICD-10-CM | POA: Diagnosis present

## 2014-11-27 DIAGNOSIS — N183 Chronic kidney disease, stage 3 (moderate): Secondary | ICD-10-CM | POA: Diagnosis present

## 2014-11-27 DIAGNOSIS — R001 Bradycardia, unspecified: Secondary | ICD-10-CM | POA: Diagnosis not present

## 2014-11-27 HISTORY — DX: Dependence on supplemental oxygen: Z99.81

## 2014-11-27 HISTORY — DX: Gastro-esophageal reflux disease without esophagitis: K21.9

## 2014-11-27 HISTORY — DX: Heart failure, unspecified: I50.9

## 2014-11-27 HISTORY — DX: Anxiety disorder, unspecified: F41.9

## 2014-11-27 HISTORY — DX: Left bundle-branch block, unspecified: I44.7

## 2014-11-27 HISTORY — DX: Migraine, unspecified, not intractable, without status migrainosus: G43.909

## 2014-11-27 HISTORY — DX: Spondylosis without myelopathy or radiculopathy, cervical region: M47.812

## 2014-11-27 HISTORY — DX: Personal history of other diseases of the digestive system: Z87.19

## 2014-11-27 HISTORY — DX: Bradycardia, unspecified: R00.1

## 2014-11-27 HISTORY — DX: Hyperlipidemia, unspecified: E78.5

## 2014-11-27 HISTORY — DX: Essential (primary) hypertension: I10

## 2014-11-27 LAB — CBC WITH DIFFERENTIAL/PLATELET
Basophils Absolute: 0.1 10*3/uL (ref 0.0–0.1)
Basophils Relative: 1 % (ref 0–1)
Eosinophils Absolute: 0.3 10*3/uL (ref 0.0–0.7)
Eosinophils Relative: 3 % (ref 0–5)
HCT: 38.3 % (ref 36.0–46.0)
Hemoglobin: 13.1 g/dL (ref 12.0–15.0)
Lymphocytes Relative: 26 % (ref 12–46)
Lymphs Abs: 2.3 10*3/uL (ref 0.7–4.0)
MCH: 31.6 pg (ref 26.0–34.0)
MCHC: 34.2 g/dL (ref 30.0–36.0)
MCV: 92.5 fL (ref 78.0–100.0)
Monocytes Absolute: 1.2 10*3/uL — ABNORMAL HIGH (ref 0.1–1.0)
Monocytes Relative: 13 % — ABNORMAL HIGH (ref 3–12)
Neutro Abs: 5.3 10*3/uL (ref 1.7–7.7)
Neutrophils Relative %: 57 % (ref 43–77)
Platelets: 280 10*3/uL (ref 150–400)
RBC: 4.14 MIL/uL (ref 3.87–5.11)
RDW: 12.7 % (ref 11.5–15.5)
WBC: 9.1 10*3/uL (ref 4.0–10.5)

## 2014-11-27 LAB — COMPREHENSIVE METABOLIC PANEL
ALT: 14 U/L (ref 0–35)
AST: 20 U/L (ref 0–37)
Albumin: 3.1 g/dL — ABNORMAL LOW (ref 3.5–5.2)
Alkaline Phosphatase: 49 U/L (ref 39–117)
Anion gap: 11 (ref 5–15)
BUN: 19 mg/dL (ref 6–23)
CO2: 25 mmol/L (ref 19–32)
Calcium: 8.8 mg/dL (ref 8.4–10.5)
Chloride: 100 mEq/L (ref 96–112)
Creatinine, Ser: 1.41 mg/dL — ABNORMAL HIGH (ref 0.50–1.10)
GFR calc Af Amer: 40 mL/min — ABNORMAL LOW (ref >90)
GFR calc non Af Amer: 34 mL/min — ABNORMAL LOW (ref >90)
Glucose, Bld: 113 mg/dL — ABNORMAL HIGH (ref 70–99)
Potassium: 3.3 mmol/L — ABNORMAL LOW (ref 3.5–5.1)
Sodium: 136 mmol/L (ref 135–145)
Total Bilirubin: 0.4 mg/dL (ref 0.3–1.2)
Total Protein: 6.2 g/dL (ref 6.0–8.3)

## 2014-11-27 LAB — TSH: TSH: 3.708 u[IU]/mL (ref 0.350–4.500)

## 2014-11-27 LAB — BRAIN NATRIURETIC PEPTIDE: B NATRIURETIC PEPTIDE 5: 242.4 pg/mL — AB (ref 0.0–100.0)

## 2014-11-27 LAB — TROPONIN I: Troponin I: <0.03 ng/mL (ref <0.031)

## 2014-11-27 LAB — I-STAT TROPONIN, ED: Troponin i, poc: 0 ng/mL (ref 0.00–0.08)

## 2014-11-27 MED ORDER — ACETAMINOPHEN 650 MG RE SUPP
650.0000 mg | Freq: Four times a day (QID) | RECTAL | Status: DC | PRN
  Filled 2014-11-27 (×2): qty 1

## 2014-11-27 MED ORDER — ENSURE COMPLETE PO LIQD
237.0000 mL | Freq: Two times a day (BID) | ORAL | Status: DC
  Administered 2014-11-28 – 2014-12-02 (×9): 237 mL via ORAL

## 2014-11-27 MED ORDER — SODIUM CHLORIDE 0.9 % IJ SOLN: 3.0000 mL | INTRAMUSCULAR | Status: DC | PRN

## 2014-11-27 MED ORDER — ALPRAZOLAM 0.5 MG PO TABS
0.5000 mg | ORAL_TABLET | Freq: Two times a day (BID) | ORAL | Status: DC
  Administered 2014-11-27 – 2014-12-02 (×9): 0.5 mg via ORAL
  Filled 2014-11-27 (×10): qty 1

## 2014-11-27 MED ORDER — ENOXAPARIN SODIUM 40 MG/0.4ML ~~LOC~~ SOLN
40.0000 mg | SUBCUTANEOUS | Status: DC
  Administered 2014-11-27 – 2014-11-28 (×2): 40 mg via SUBCUTANEOUS
  Filled 2014-11-27 (×3): qty 0.4

## 2014-11-27 MED ORDER — SODIUM CHLORIDE 0.9 % IJ SOLN
3.0000 mL | Freq: Two times a day (BID) | INTRAMUSCULAR | Status: DC
  Administered 2014-11-27 – 2014-12-02 (×10): 3 mL via INTRAVENOUS

## 2014-11-27 MED ORDER — PANTOPRAZOLE SODIUM 40 MG PO TBEC
40.0000 mg | DELAYED_RELEASE_TABLET | Freq: Every day | ORAL | Status: DC
  Administered 2014-11-28 – 2014-12-02 (×5): 40 mg via ORAL
  Filled 2014-11-27 (×4): qty 1

## 2014-11-27 MED ORDER — CITALOPRAM HYDROBROMIDE 20 MG PO TABS
20.0000 mg | ORAL_TABLET | Freq: Every day | ORAL | Status: DC
  Administered 2014-11-27 – 2014-12-02 (×6): 20 mg via ORAL
  Filled 2014-11-27 (×6): qty 1

## 2014-11-27 MED ORDER — POTASSIUM CHLORIDE CRYS ER 20 MEQ PO TBCR
40.0000 meq | EXTENDED_RELEASE_TABLET | Freq: Once | ORAL | Status: AC
  Administered 2014-11-27: 40 meq via ORAL
  Filled 2014-11-27: qty 2

## 2014-11-27 MED ORDER — ACETAMINOPHEN 325 MG PO TABS
650.0000 mg | ORAL_TABLET | Freq: Four times a day (QID) | ORAL | Status: DC | PRN
  Administered 2014-11-27 – 2014-12-02 (×11): 650 mg via ORAL
  Filled 2014-11-27 (×13): qty 2

## 2014-11-27 MED ORDER — ATORVASTATIN CALCIUM 40 MG PO TABS
40.0000 mg | ORAL_TABLET | Freq: Every day | ORAL | Status: DC
  Administered 2014-11-27 – 2014-12-01 (×5): 40 mg via ORAL
  Filled 2014-11-27 (×6): qty 1

## 2014-11-27 MED ORDER — SODIUM CHLORIDE 0.9 % IV SOLN: 250.0000 mL | INTRAVENOUS | Status: DC | PRN

## 2014-11-27 MED ORDER — ONDANSETRON HCL 4 MG/2ML IJ SOLN: 4.0000 mg | Freq: Four times a day (QID) | INTRAMUSCULAR | Status: DC | PRN

## 2014-11-27 MED ORDER — ONDANSETRON HCL 4 MG PO TABS
4.0000 mg | ORAL_TABLET | Freq: Four times a day (QID) | ORAL | Status: DC | PRN
  Administered 2014-11-29: 4 mg via ORAL
  Filled 2014-11-27: qty 1

## 2014-11-27 MED ORDER — FUROSEMIDE 10 MG/ML IJ SOLN
40.0000 mg | INTRAMUSCULAR | Status: AC
  Administered 2014-11-27: 40 mg via INTRAVENOUS
  Filled 2014-11-27: qty 4

## 2014-11-27 NOTE — ED Notes (Addendum)
Pt from Dr [facility] office via [facility] with c/o weakness, fatigue, decreased appetite and exertional SOB x 2 weeks.  Pt reports right shoulder pain and nausea both intermittent over same time frame.  Denies V/D, hx of CHF and a-fib.  Lungs clear per EMS.  Pt in NAD, A&O.  Per Dr Ernie Hew, "suspicious symptoms are related to CHF and A-fib."

## 2014-11-27 NOTE — ED Notes (Addendum)
Patient returned from X-ray 

## 2014-11-27 NOTE — H&P (Signed)
PCP:   Walker Kehr, MD   Chief Complaint:   Fatigue  HPI: 80 year old female who   has a past medical history of CHF (congestive heart failure); BBB (bundle branch block); A-fib; Bradycardia; Heart disease; Hyperlipidemia; Hypertension; Fatigue; Osteoarthritis cervical spine; Kidney failure; Anxiety; and CAD (coronary artery disease). Today comes to the hospital with chief complaint of fatigue and weakness for past 1 week. She also endorses some shortness of breath when she walks, though she has lost 13 pounds since her weight in July. She denies chest pain, but complains of right shoulder pain. Patient says that she was recently taken off Xanax by her cardiologist which she has been taking since 2012. After stopping Xanax patient is having trouble sleeping and is awake 2-3 hours at night in bed. She denies nausea vomiting or diarrhea. Does complain of some dry heaves yesterday. She also complains of urgency of urination. Denies dysuria. In the ED patient was found to have mild elevation of BNP 242 and received 1 dose of Lasix. Her creatinine is 1.41. EKG shows atrial fibrillation with left bundle branch block. Cardiac enzymes are negative in the ED.  Allergies:   Allergies  Allergen Reactions  . Ampicillin Shortness Of Breath, Swelling and Rash    REACTION: SOB, Rash, edema  . Hydromorphone Hcl Shortness Of Breath, Itching, Swelling and Rash  . Penicillins Anaphylaxis    Moderate anaphylaxis      Past Medical History  Diagnosis Date  . CHF (congestive heart failure)   . BBB (bundle branch block)   . A-fib   . Bradycardia   . Heart disease   . Hyperlipidemia   . Hypertension   . Fatigue   . Osteoarthritis cervical spine   . Kidney failure   . Anxiety   . CAD (coronary artery disease)     History reviewed. No pertinent past surgical history.  Prior to Admission medications   Medication Sig Start Date End Date Taking? Authorizing Provider  amLODipine-benazepril  (LOTREL) 10-20 MG per capsule Take 1 capsule by mouth daily.   Yes Historical Provider, MD  atenolol (TENORMIN) 50 MG tablet Take 50 mg by mouth daily.   Yes Historical Provider, MD  Pseudoephedrine HCl (DECONGESTANT PO) Take 1 tablet by mouth as needed (for congestion).   Yes Historical Provider, MD  simvastatin (ZOCOR) 80 MG tablet Take 1 tablet (80 mg total) by mouth every evening. 03/22/11 11/27/14 Yes Aleksei Plotnikov V, MD  ALPRAZolam (XANAX) 0.5 MG tablet Take 1 tablet (0.5 mg total) by mouth 2 (two) times daily. 08/29/11 08/28/12  Aleksei Plotnikov V, MD  atenolol (TENORMIN) 25 MG tablet Take 1 tablet (25 mg total) by mouth daily. 03/22/11 03/21/12  Aleksei Plotnikov V, MD  citalopram (CELEXA) 20 MG tablet Take 1 tablet (20 mg total) by mouth daily. 03/07/12   Aleksei Plotnikov V, MD  estradiol (CLIMARA - DOSED IN MG/24 HR) 0.0375 mg/24hr Place 1 patch (0.0375 mg total) onto the skin once a week. 03/07/12   Aleksei Plotnikov V, MD  estradiol (VIVELLE-DOT) 0.0375 MG/24HR Place 1 patch onto the skin once a week. 03/22/11 03/21/12  Aleksei Plotnikov V, MD  meloxicam (MOBIC) 7.5 MG tablet Take 1 tablet (7.5 mg total) by mouth daily. 03/07/12   Aleksei Plotnikov V, MD  omeprazole (PRILOSEC) 20 MG capsule Take 1 capsule (20 mg total) by mouth daily. 03/22/11 03/21/12  Cassandria Anger, MD    Social History:  reports that she has never smoked. She has never used smokeless  tobacco. She reports that she does not drink alcohol or use illicit drugs.   All the positives are listed in BOLD  Review of Systems:  HEENT: Headache, blurred vision, runny nose, sore throat Neck: Hypothyroidism, hyperthyroidism,,lymphadenopathy Chest : Shortness of breath, history of COPD, Asthma Heart : Chest pain, history of coronary arterey disease GI:  Nausea, vomiting, diarrhea, constipation, GERD GU: Dysuria, urgency, frequency of urination, hematuria Neuro: Stroke, seizures, syncope Psych: Depression, anxiety,  hallucinations, insomnia   Physical Exam: Blood pressure 124/70, pulse 40, temperature 97.7 F (36.5 C), temperature source Oral, resp. rate 20, SpO2 96 %. Constitutional:   Patient is a well-developed and well-nourished female* in no acute distress and cooperative with exam. Head: Normocephalic and atraumatic Mouth: Mucus membranes moist Eyes: PERRL, EOMI, conjunctivae normal Neck: Supple, No Thyromegaly Cardiovascular: RRR, S1 normal, S2 normal Pulmonary/Chest: CTAB, no wheezes, rales, or rhonchi Abdominal: Soft. Non-tender, non-distended, bowel sounds are normal, no masses, organomegaly, or guarding present.  Neurological: A&O x3, Strength is normal and symmetric bilaterally, cranial nerve II-XII are grossly intact, no focal motor deficit, sensory intact to light touch bilaterally.  Extremities : Bilateral 1+ edema of the lower extremities  Labs on Admission:  Basic Metabolic Panel:  Recent Labs Lab 11/27/14 1557  NA 136  K 3.3*  CL 100  CO2 25  GLUCOSE 113*  BUN 19  CREATININE 1.41*  CALCIUM 8.8   Liver Function Tests:  Recent Labs Lab 11/27/14 1557  AST 20  ALT 14  ALKPHOS 49  BILITOT 0.4  PROT 6.2  ALBUMIN 3.1*   No results for input(s): LIPASE, AMYLASE in the last 168 hours. No results for input(s): AMMONIA in the last 168 hours. CBC:  Recent Labs Lab 11/27/14 1557  WBC 9.1  NEUTROABS 5.3  HGB 13.1  HCT 38.3  MCV 92.5  PLT 280   Cardiac Enzymes: No results for input(s): CKTOTAL, CKMB, CKMBINDEX, TROPONINI in the last 168 hours.  BNP (last 3 results) No results for input(s): PROBNP in the last 8760 hours. CBG: No results for input(s): GLUCAP in the last 168 hours.  Radiological Exams on Admission: Dg Chest 2 View  11/27/2014   CLINICAL DATA:  Fatigue and weakness for 1 week. Shortness of breath and right shoulder pain.  EXAM: CHEST  2 VIEW  COMPARISON:  03/03/2009  FINDINGS: Normal heart size calcified atherosclerotic plaque involves the  aortic arch. No pleural effusion or edema. No airspace consolidation. Asymmetric elevation of the right hemidiaphragm identified.  IMPRESSION: 1. No acute cardiopulmonary abnormalities.   Electronically Signed   By: Kerby Moors M.D.   On: 11/27/2014 17:10    EKG: Independently reviewed. Atrial fibrillation with left bundle-branch block   Assessment/Plan Principal Problem:   Fatigue Active Problems:   Anxiety state   Essential hypertension   Coronary atherosclerosis   SOB (shortness of breath)   AKI (acute kidney injury)  Fatigue Patient is presenting with symptoms of fatigue and generalized weakness for past 1 week. She does have a history of CHF but is not on Lasix at this time. Patient does not appear to be in fluid overload, her lungs are clear to auscultation. She has received 1 dose of Lasix in the ED will assess the response. Follow BMP in a.m. I'm going to check TSH. Also patient stop taking Xanax a month ago and has not been sleeping well which could also contribute to the fatigue. Restart back on Xanax 0.5 twice a day  History of CHF No echo report  available. Will obtain 2-D echo cardia gram in a.m. 1 dose of Lasix 40 g given the ED. Follow BMP in a.m. Cycle the cardiac enzymes.Will not start Lasix at this time. If echo is abnormal consider starting Lasix.   GERD Continue Protonix 40 g daily  Atrial fibrillation Patient's heart rate is controlled, continue atenolol. Patient is not on any anticoagulation.  Acute kidney injury Patient's creatinine is 1.41, no previous creatinine available. Follow BMP in a.m.  Anxiety/depression Continue Celexa, will restart Xanax 0.5 twice a day. Patient has been taking Xanax for past 3 years which was recently stopped by patient's cardiologist, patient was told that it is habit forming so Xanax was stopped.  DVT prophylaxis Lovenox  Code status: Full code  Family discussion: No family at bedside   Time Spent on Admission: [facility] Pager: [phone] 11/27/2014, 6:02 PM  If 7PM-7AM, please contact night-coverage  [email or web address]  Password [email or web address]

## 2014-11-27 NOTE — ED Notes (Signed)
PHlebotomy at the bedside.

## 2014-11-27 NOTE — ED Notes (Signed)
Admitting at the bedside.  

## 2014-11-27 NOTE — ED Provider Notes (Signed)
CSN: [id number]     Arrival date & time 11/27/14  1524 History   First MD Initiated Contact with Patient 11/27/14 1525     Chief Complaint  Patient presents with  . Fatigue  . Weakness     (Consider location/radiation/quality/duration/timing/severity/associated sxs/prior Treatment) HPI Comments: Patient with past medical history of CHF, A. fib, hyperlipidemia, hypertension, CAD, presents emergency department with chief complaint of fatigue and exertional shortness of breath. Patient states that she has had increasing shortness of breath for the past week. She also reports that she cannot walk from her couch to her recliner without becoming severely out of breath. She reports mild increase in lower extremity edema. She was seen by her primary care physician today, and was written for to the emergency department for further evaluation and treatment. Patient denies any chest pain, but does state that she has right shoulder pain. She states that this pain has been present for the past 1 week. She has tried using Aspercreme and other topical ointments with no relief. The pain is not aggravated with movement or palpation. She denies any abdominal pain, nausea, vomiting, diarrhea, constipation.  PCP:  Dr. Alain Marion   The history is provided by the patient. No language interpreter was used.    Past Medical History  Diagnosis Date  . CHF (congestive heart failure)   . BBB (bundle branch block)   . A-fib   . Bradycardia   . Heart disease   . Hyperlipidemia   . Hypertension   . Fatigue   . Osteoarthritis cervical spine   . Kidney failure   . Anxiety   . CAD (coronary artery disease)    History reviewed. No pertinent past surgical history. History reviewed. No pertinent family history. History  Substance Use Topics  . Smoking status: Never Smoker   . Smokeless tobacco: Never Used  . Alcohol Use: No   OB History    No data available     Review of Systems  Constitutional: Negative  for fever and chills.  Respiratory: Positive for shortness of breath.   Cardiovascular: Negative for chest pain.  Gastrointestinal: Negative for nausea, vomiting, diarrhea and constipation.  Genitourinary: Negative for dysuria.  All other systems reviewed and are negative.     Allergies  Ampicillin; Hydromorphone hcl; and Penicillins  Home Medications   Prior to Admission medications   Medication Sig Start Date End Date Taking? Authorizing Provider  ALPRAZolam Duanne Moron) 0.5 MG tablet Take 1 tablet (0.5 mg total) by mouth 2 (two) times daily. 08/29/11 08/28/12  Aleksei Plotnikov V, MD  atenolol (TENORMIN) 25 MG tablet Take 1 tablet (25 mg total) by mouth daily. 03/22/11 03/21/12  Aleksei Plotnikov V, MD  citalopram (CELEXA) 20 MG tablet Take 1 tablet (20 mg total) by mouth daily. 03/07/12   Aleksei Plotnikov V, MD  estradiol (CLIMARA - DOSED IN MG/24 HR) 0.0375 mg/24hr Place 1 patch (0.0375 mg total) onto the skin once a week. 03/07/12   Aleksei Plotnikov V, MD  estradiol (VIVELLE-DOT) 0.0375 MG/24HR Place 1 patch onto the skin once a week. 03/22/11 03/21/12  Aleksei Plotnikov V, MD  meloxicam (MOBIC) 7.5 MG tablet Take 1 tablet (7.5 mg total) by mouth daily. 03/07/12   Aleksei Plotnikov V, MD  omeprazole (PRILOSEC) 20 MG capsule Take 1 capsule (20 mg total) by mouth daily. 03/22/11 03/21/12  Aleksei Plotnikov V, MD  simvastatin (ZOCOR) 80 MG tablet Take 1 tablet (80 mg total) by mouth every evening. 03/22/11 03/21/12  Cassandria Anger, MD  BP 117/69 mmHg  Pulse 39  Temp(Src) 97.7 F (36.5 C) (Oral)  Resp 19  SpO2 98% Physical Exam  Constitutional: She is oriented to person, place, and time. She appears well-developed and well-nourished.  HENT:  Head: Normocephalic and atraumatic.  Eyes: Conjunctivae and EOM are normal. Pupils are equal, round, and reactive to light.  Neck: Normal range of motion. Neck supple.  Cardiovascular: Normal rate and regular rhythm.  Exam reveals no gallop and no  friction rub.   No murmur heard. Irregularly irregular  Pulmonary/Chest: Effort normal and breath sounds normal. No respiratory distress. She has no wheezes. She has no rales. She exhibits no tenderness.  Abdominal: Soft. Bowel sounds are normal. She exhibits no distension and no mass. There is no tenderness. There is no rebound and no guarding.  Musculoskeletal: Normal range of motion. She exhibits no edema or tenderness.  Neurological: She is alert and oriented to person, place, and time.  Skin: Skin is warm and dry.  Psychiatric: She has a normal mood and affect. Her behavior is normal. Judgment and thought content normal.  Nursing note and vitals reviewed.   ED Course  Procedures (including critical care time) Results for orders placed or performed during the hospital encounter of 11/27/14  CBC with Differential  Result Value Ref Range   WBC 9.1 4.0 - 10.5 K/uL   RBC 4.14 3.87 - 5.11 MIL/uL   Hemoglobin 13.1 12.0 - 15.0 g/dL   HCT 38.3 36.0 - 46.0 %   MCV 92.5 78.0 - 100.0 fL   MCH 31.6 26.0 - 34.0 pg   MCHC 34.2 30.0 - 36.0 g/dL   RDW 12.7 11.5 - 15.5 %   Platelets 280 150 - 400 K/uL   Neutrophils Relative % 57 43 - 77 %   Neutro Abs 5.3 1.7 - 7.7 K/uL   Lymphocytes Relative 26 12 - 46 %   Lymphs Abs 2.3 0.7 - 4.0 K/uL   Monocytes Relative 13 (H) 3 - 12 %   Monocytes Absolute 1.2 (H) 0.1 - 1.0 K/uL   Eosinophils Relative 3 0 - 5 %   Eosinophils Absolute 0.3 0.0 - 0.7 K/uL   Basophils Relative 1 0 - 1 %   Basophils Absolute 0.1 0.0 - 0.1 K/uL  Comprehensive metabolic panel  Result Value Ref Range   Sodium 136 135 - 145 mmol/L   Potassium 3.3 (L) 3.5 - 5.1 mmol/L   Chloride 100 96 - 112 mEq/L   CO2 25 19 - 32 mmol/L   Glucose, Bld 113 (H) 70 - 99 mg/dL   BUN 19 6 - 23 mg/dL   Creatinine, Ser 1.41 (H) 0.50 - 1.10 mg/dL   Calcium 8.8 8.4 - 10.5 mg/dL   Total Protein 6.2 6.0 - 8.3 g/dL   Albumin 3.1 (L) 3.5 - 5.2 g/dL   AST 20 0 - 37 U/L   ALT 14 0 - 35 U/L    Alkaline Phosphatase 49 39 - 117 U/L   Total Bilirubin 0.4 0.3 - 1.2 mg/dL   GFR calc non Af Amer 34 (L) >90 mL/min   GFR calc Af Amer 40 (L) >90 mL/min   Anion gap 11 5 - 15  Brain natriuretic peptide  Result Value Ref Range   B Natriuretic Peptide 242.4 (H) 0.0 - 100.0 pg/mL  I-stat troponin, ED  Result Value Ref Range   Troponin i, poc 0.00 0.00 - 0.08 ng/mL   Comment 3  Dg Chest 2 View  11/27/2014   CLINICAL DATA:  Fatigue and weakness for 1 week. Shortness of breath and right shoulder pain.  EXAM: CHEST  2 VIEW  COMPARISON:  03/03/2009  FINDINGS: Normal heart size calcified atherosclerotic plaque involves the aortic arch. No pleural effusion or edema. No airspace consolidation. Asymmetric elevation of the right hemidiaphragm identified.  IMPRESSION: 1. No acute cardiopulmonary abnormalities.   Electronically Signed   By: Kerby Moors M.D.   On: 11/27/2014 17:10     Imaging Review No results found.   EKG Interpretation   Date/Time:  Thursday November 27 2014 15:24:20 EST Ventricular Rate:  70 PR Interval:  71 QRS Duration: 170 QT Interval:  423 QTC Calculation: 456 R Axis:   -49 Text Interpretation:  Atrial fibrillation Atrial premature complexes Left  bundle branch block Confirmed by Hazle Coca [phone]) on 11/27/2014 3:46:34  PM      MDM   Final diagnoses:  SOB (shortness of breath)    Patient with exertional SOB worsening x 1 week.  Discussed with Dr. Ralene Bathe.  Some new EKG changes, but last was 12 years ago on record.  BNP elevated to 242.  She has noticed some fluid on her legs.  Will give 40 of lasix.  Patient discussed with hospitalist.  Appreciate Dr. Darrick Meigs for admission.    Montine Circle, PA-C 11/27/14 [place], MD 11/27/14 1900

## 2014-11-28 LAB — COMPREHENSIVE METABOLIC PANEL
ALK PHOS: 51 U/L (ref 39–117)
ALT: 12 U/L (ref 0–35)
AST: 18 U/L (ref 0–37)
Albumin: 3.1 g/dL — ABNORMAL LOW (ref 3.5–5.2)
Anion gap: 9 (ref 5–15)
BUN: 17 mg/dL (ref 6–23)
CO2: 29 mmol/L (ref 19–32)
CREATININE: 1.43 mg/dL — AB (ref 0.50–1.10)
Calcium: 9.1 mg/dL (ref 8.4–10.5)
Chloride: 102 mEq/L (ref 96–112)
GFR calc Af Amer: 39 mL/min — ABNORMAL LOW (ref >90)
GFR calc non Af Amer: 34 mL/min — ABNORMAL LOW (ref >90)
Glucose, Bld: 120 mg/dL — ABNORMAL HIGH (ref 70–99)
POTASSIUM: 4.4 mmol/L (ref 3.5–5.1)
Sodium: 140 mmol/L (ref 135–145)
Total Bilirubin: 0.3 mg/dL (ref 0.3–1.2)
Total Protein: 5 g/dL — ABNORMAL LOW (ref 6.0–8.3)

## 2014-11-28 LAB — URINALYSIS, ROUTINE W REFLEX MICROSCOPIC
BILIRUBIN URINE: NEGATIVE
GLUCOSE, UA: NEGATIVE mg/dL
Hgb urine dipstick: NEGATIVE
Ketones, ur: NEGATIVE mg/dL
Nitrite: POSITIVE — AB
PH: 5.5 (ref 5.0–8.0)
Protein, ur: NEGATIVE mg/dL
Specific Gravity, Urine: 1.021 (ref 1.005–1.030)
Urobilinogen, UA: 0.2 mg/dL (ref 0.0–1.0)

## 2014-11-28 LAB — TROPONIN I: Troponin I: <0.03 ng/mL (ref <0.031)

## 2014-11-28 LAB — CBC
HCT: 38.2 % (ref 36.0–46.0)
HEMOGLOBIN: 13 g/dL (ref 12.0–15.0)
MCH: 32.6 pg (ref 26.0–34.0)
MCHC: 34 g/dL (ref 30.0–36.0)
MCV: 95.7 fL (ref 78.0–100.0)
PLATELETS: 264 10*3/uL (ref 150–400)
RBC: 3.99 MIL/uL (ref 3.87–5.11)
RDW: 12.9 % (ref 11.5–15.5)
WBC: 7.7 10*3/uL (ref 4.0–10.5)

## 2014-11-28 LAB — CK: CK TOTAL: 58 U/L (ref 7–177)

## 2014-11-28 LAB — URINE MICROSCOPIC-ADD ON

## 2014-11-28 MED ORDER — SODIUM CHLORIDE 0.9 % IV BOLUS (SEPSIS)
250.0000 mL | Freq: Once | INTRAVENOUS | Status: AC
  Administered 2014-11-28: 250 mL via INTRAVENOUS

## 2014-11-28 MED ORDER — SODIUM CHLORIDE 0.9 % IV SOLN: 250.0000 mL | INTRAVENOUS | Status: DC | PRN

## 2014-11-28 MED ORDER — METOPROLOL TARTRATE 50 MG PO TABS
50.0000 mg | ORAL_TABLET | Freq: Two times a day (BID) | ORAL | Status: DC
  Administered 2014-11-28 – 2014-11-30 (×5): 50 mg via ORAL
  Filled 2014-11-28 (×6): qty 1

## 2014-11-28 MED ORDER — CIPROFLOXACIN HCL 500 MG PO TABS
500.0000 mg | ORAL_TABLET | Freq: Two times a day (BID) | ORAL | Status: AC
  Administered 2014-11-28 – 2014-12-01 (×6): 500 mg via ORAL
  Filled 2014-11-28 (×7): qty 1

## 2014-11-28 MED ORDER — CEFTRIAXONE SODIUM IN DEXTROSE 20 MG/ML IV SOLN: 1.0000 g | INTRAVENOUS | Status: DC

## 2014-11-28 MED ORDER — ASPIRIN 81 MG PO CHEW
81.0000 mg | CHEWABLE_TABLET | Freq: Every day | ORAL | Status: DC
  Administered 2014-11-28 – 2014-12-01 (×4): 81 mg via ORAL
  Filled 2014-11-28 (×4): qty 1

## 2014-11-28 NOTE — Progress Notes (Signed)
Chaplain responded to spiritual care consult for pt requesting prayer. Chaplain offered prayer and informed pt of chaplain services should she need it. Page chaplain as needed.    11/28/14 1400  Clinical Encounter Type  Visited With Patient  Visit Type Initial;Spiritual support  Referral From Nurse  Spiritual Encounters  Spiritual Needs Prayer  Stress Factors  Patient Stress Factors Health changes  Jhana Giarratano, Epifanio Lesches 11/28/2014 2:53 PM

## 2014-11-28 NOTE — Progress Notes (Signed)
Patient Demographics  [name], is a 81 y.o. female, DOB - 30-Aug-1932, EZM:[id number]  Admit date - 11/27/2014   Admitting Physician Oswald Hillock, MD  Outpatient Primary MD for the patient is [facility], MD  LOS - 1   Chief Complaint  Patient presents with  . Fatigue  . Weakness        Subjective:   [name] today has, No headache, No chest pain, No abdominal pain - No Nausea, No new weakness tingling or numbness, No Cough - SOB.    Assessment & Plan    1.Faitgue - likely due to comminution of lack of sleep and dehydration. Feels much better after a good night sleep on resume Xanax, gently hydrate, TSH stable, UA pending, chest x-ray nonacute, initiate PT and activity. Likely discharge in the morning if stable. Will check CK has on high-dose statin.   2. Chronic anxiety. Resumed on home dose Xanax.   3. History of atrial fibrillation with left bundle branch block and essential hypertension. Placed on moderate dose better blocker, she is not on anticoagulation, place on low-dose aspirin follow with primary cardiologist and PCP. Trop -ve x 3. Echogram pending.   4. Renal failure. No previous BMP in the system. This could be her baseline, we'll hydrate gently and monitor. Check UA.    5. Dyslipidemia. On statin continue.      6.GERD - on PPI     Code Status: Full  Family Communication: None  Disposition Plan: Home   Procedures   TTE   Consults      Medications  Scheduled Meds: . ALPRAZolam  0.5 mg Oral BID  . atorvastatin  40 mg Oral q1800  . citalopram  20 mg Oral Daily  . enoxaparin (LOVENOX) injection  40 mg Subcutaneous Q24H  . feeding supplement (ENSURE COMPLETE)  237 mL Oral BID BM  . metoprolol tartrate  50 mg Oral BID  . pantoprazole  40 mg  Oral Daily  . sodium chloride  3 mL Intravenous Q12H   Continuous Infusions:  PRN Meds:.sodium chloride, acetaminophen **OR** acetaminophen, ondansetron **OR** ondansetron (ZOFRAN) IV, sodium chloride  DVT Prophylaxis  Lovenox    Lab Results  Component Value Date   PLT 264 11/28/2014    Antibiotics     Anti-infectives    None          Objective:   Filed Vitals:   11/27/14 2114 11/28/14 0228 11/28/14 0511 11/28/14 0854  BP: 128/59 110/69 100/50 116/51  Pulse: 81 61 96 101  Temp: 97.9 F (36.6 C) 98.4 F (36.9 C) 97.3 F (36.3 C) 97.9 F (36.6 C)  TempSrc: Oral Oral Oral Oral  Resp: 0 20 20 20   Height:      Weight:   81.8 kg (180 lb 5.4 oz)   SpO2: 97% 99% 100% 99%    Wt Readings from Last 3 Encounters:  11/28/14 81.8 kg (180 lb 5.4 oz)  02/14/11 92.08 kg (203 lb)     Intake/Output Summary (Last 24 hours) at 11/28/14 1123 Last data filed at 11/28/14 0908  Gross per 24 hour  Intake    960 ml  Output   1580 ml  Net   -620 ml     Physical Exam  Awake Alert, Oriented X 3, No new F.N deficits, Normal affect .AT,PERRAL Supple Neck,No JVD, No cervical lymphadenopathy appriciated.  Symmetrical Chest wall movement, Good air movement bilaterally, CTAB Irregular RRR,No Gallops,Rubs or new Murmurs, No Parasternal Heave +ve B.Sounds, Abd Soft, No tenderness, No organomegaly appriciated, No rebound - guarding or rigidity. No Cyanosis, Clubbing or edema, No new Rash or bruise      Data Review   Micro Results No results found for this or any previous visit (from the past 240 hour(s)).  Radiology Reports Dg Chest 2 View  11/27/2014   CLINICAL DATA:  Fatigue and weakness for 1 week. Shortness of breath and right shoulder pain.  EXAM: CHEST  2 VIEW  COMPARISON:  03/03/2009  FINDINGS: Normal heart size calcified atherosclerotic plaque involves the aortic arch. No pleural effusion or edema. No airspace consolidation. Asymmetric elevation of the right  hemidiaphragm identified.  IMPRESSION: 1. No acute cardiopulmonary abnormalities.   Electronically Signed   By: Kerby Moors M.D.   On: 11/27/2014 17:10     CBC  Recent Labs Lab 11/27/14 1557 11/28/14 0724  WBC 9.1 7.7  HGB 13.1 13.0  HCT 38.3 38.2  PLT 280 264  MCV 92.5 95.7  MCH 31.6 32.6  MCHC 34.2 34.0  RDW 12.7 12.9  LYMPHSABS 2.3  --   MONOABS 1.2*  --   EOSABS 0.3  --   BASOSABS 0.1  --     Chemistries   Recent Labs Lab 11/27/14 1557 11/28/14 0724  NA 136 140  K 3.3* 4.4  CL 100 102  CO2 25 29  GLUCOSE 113* 120*  BUN 19 17  CREATININE 1.41* 1.43*  CALCIUM 8.8 9.1  AST 20 18  ALT 14 12  ALKPHOS 49 51  BILITOT 0.4 0.3   ------------------------------------------------------------------------------------------------------------------ estimated creatinine clearance is 33.5 mL/min (by C-G formula based on Cr of 1.43). ------------------------------------------------------------------------------------------------------------------ No results for input(s): HGBA1C in the last 72 hours. ------------------------------------------------------------------------------------------------------------------ No results for input(s): CHOL, HDL, LDLCALC, TRIG, CHOLHDL, LDLDIRECT in the last 72 hours. ------------------------------------------------------------------------------------------------------------------  Recent Labs  11/27/14 2005  TSH 3.708   ------------------------------------------------------------------------------------------------------------------ No results for input(s): VITAMINB12, FOLATE, FERRITIN, TIBC, IRON, RETICCTPCT in the last 72 hours.  Coagulation profile No results for input(s): INR, PROTIME in the last 168 hours.  No results for input(s): DDIMER in the last 72 hours.  Cardiac Enzymes  Recent Labs Lab 11/27/14 2005 11/28/14 0021 11/28/14 0724  TROPONINI <0.03 <0.03 <0.03    ------------------------------------------------------------------------------------------------------------------ Invalid input(s): POCBNP     Time Spent in minutes   35   SINGH,PRASHANT K M.D on 11/28/2014 at 11:23 AM  Between 7am to 7pm - Pager - [phone]  After 7pm go to [email or web address] - [facility] Office  [phone]

## 2014-11-28 NOTE — Progress Notes (Signed)
Message sent to Dr. Candiss Norse to review Urinalysis results from urine sent earlier today. Will continue to monitor patient.

## 2014-11-28 NOTE — Progress Notes (Addendum)
Patient sitting in chair with no complaints at this time. Patient HR ranging from 120-150 in atrial fib. Dr. Candiss Norse made aware. New orders received.

## 2014-11-28 NOTE — Progress Notes (Signed)
The patient's heart rhythm stayed in A. Fib overnight.  Her HR continued to rise into the low 140s briefly only when ambulating to the bathroom.  Otherwise, her HR is around 100 or lower.  She has some dyspnea on exertion and stated that she wears O2 at night at home, so she was placed on 2L of O2 overnight.  She received Tylenol for a headache with relief.

## 2014-11-28 NOTE — Progress Notes (Signed)
The patient's HR is now in the 90s-110s.  The RN will continue to monitor the HR.

## 2014-11-29 ENCOUNTER — Encounter (HOSPITAL_COMMUNITY): Payer: Self-pay | Admitting: Cardiology

## 2014-11-29 DIAGNOSIS — I4819 Other persistent atrial fibrillation: Secondary | ICD-10-CM | POA: Diagnosis present

## 2014-11-29 LAB — BASIC METABOLIC PANEL
Anion gap: 8 (ref 5–15)
BUN: 19 mg/dL (ref 6–23)
CO2: 26 mmol/L (ref 19–32)
Calcium: 8.6 mg/dL (ref 8.4–10.5)
Chloride: 102 mEq/L (ref 96–112)
Creatinine, Ser: 1.27 mg/dL — ABNORMAL HIGH (ref 0.50–1.10)
GFR calc Af Amer: 45 mL/min — ABNORMAL LOW (ref >90)
GFR, EST NON AFRICAN AMERICAN: 39 mL/min — AB (ref >90)
GLUCOSE: 130 mg/dL — AB (ref 70–99)
POTASSIUM: 4.1 mmol/L (ref 3.5–5.1)
SODIUM: 136 mmol/L (ref 135–145)

## 2014-11-29 LAB — MAGNESIUM: Magnesium: 2 mg/dL (ref 1.5–2.5)

## 2014-11-29 MED ORDER — HYDROCODONE-ACETAMINOPHEN 5-325 MG PO TABS
1.0000 | ORAL_TABLET | ORAL | Status: DC | PRN
  Administered 2014-11-29 – 2014-12-02 (×13): 1 via ORAL
  Filled 2014-11-29 (×13): qty 1

## 2014-11-29 MED ORDER — DILTIAZEM LOAD VIA INFUSION
10.0000 mg | Freq: Once | INTRAVENOUS | Status: AC
  Administered 2014-11-29: 10 mg via INTRAVENOUS
  Filled 2014-11-29: qty 10

## 2014-11-29 MED ORDER — POLYETHYLENE GLYCOL 3350 17 G PO PACK
17.0000 g | PACK | Freq: Two times a day (BID) | ORAL | Status: DC
  Administered 2014-11-29 – 2014-12-02 (×7): 17 g via ORAL
  Filled 2014-11-29 (×8): qty 1

## 2014-11-29 MED ORDER — METOPROLOL TARTRATE 1 MG/ML IV SOLN
INTRAVENOUS | Status: AC
  Administered 2014-11-29: 5 mg via INTRAVENOUS
  Filled 2014-11-29: qty 5

## 2014-11-29 MED ORDER — SODIUM CHLORIDE 0.9 % IV SOLN: 250.0000 mL | INTRAVENOUS | Status: AC | PRN

## 2014-11-29 MED ORDER — METOPROLOL TARTRATE 1 MG/ML IV SOLN
5.0000 mg | INTRAVENOUS | Status: DC | PRN
  Administered 2014-11-29: 5 mg via INTRAVENOUS

## 2014-11-29 MED ORDER — METOPROLOL TARTRATE 1 MG/ML IV SOLN
INTRAVENOUS | Status: AC
Start: 2014-11-29 — End: 2014-11-29
  Filled 2014-11-29: qty 5

## 2014-11-29 MED ORDER — DOCUSATE SODIUM 100 MG PO CAPS
200.0000 mg | ORAL_CAPSULE | Freq: Two times a day (BID) | ORAL | Status: DC
  Administered 2014-11-29 – 2014-12-02 (×7): 200 mg via ORAL
  Filled 2014-11-29 (×8): qty 2

## 2014-11-29 MED ORDER — APIXABAN 5 MG PO TABS
5.0000 mg | ORAL_TABLET | Freq: Two times a day (BID) | ORAL | Status: DC
  Administered 2014-11-29 – 2014-12-02 (×7): 5 mg via ORAL
  Filled 2014-11-29 (×8): qty 1

## 2014-11-29 MED ORDER — DILTIAZEM HCL 100 MG IV SOLR
5.0000 mg/h | INTRAVENOUS | Status: AC
  Administered 2014-11-29 (×2): 10 mg/h via INTRAVENOUS
  Filled 2014-11-29: qty 100

## 2014-11-29 MED ORDER — METOPROLOL TARTRATE 1 MG/ML IV SOLN
5.0000 mg | Freq: Once | INTRAVENOUS | Status: AC
  Administered 2014-11-29 (×2): 5 mg via INTRAVENOUS

## 2014-11-29 MED ORDER — DILTIAZEM HCL ER COATED BEADS 240 MG PO CP24
240.0000 mg | ORAL_CAPSULE | Freq: Every day | ORAL | Status: DC
  Administered 2014-11-29 – 2014-12-02 (×4): 240 mg via ORAL
  Filled 2014-11-29 (×4): qty 1

## 2014-11-29 MED ORDER — SODIUM CHLORIDE 0.9 % IV SOLN: 250.0000 mL | INTRAVENOUS | Status: DC | PRN

## 2014-11-29 NOTE — Progress Notes (Signed)
ANTICOAGULATION CONSULT NOTE - Initial Consult  Pharmacy Consult for apixaban Indication: atrial fibrillation  Allergies  Allergen Reactions  . Ampicillin Shortness Of Breath, Swelling and Rash    REACTION: SOB, Rash, edema  . Hydromorphone Hcl Shortness Of Breath, Itching, Swelling and Rash  . Penicillins Anaphylaxis    Moderate anaphylaxis    Patient Measurements: Height: 5' 5.5" (166.4 cm) Weight: 182 lb 12.8 oz (82.918 kg) (Scale C) IBW/kg (Calculated) : 58.15  Vital Signs: Temp: 97.6 F (36.4 C) (01/02 0529) Temp Source: Oral (01/02 0529) BP: 130/57 mmHg (01/02 0529) Pulse Rate: 145 (01/02 1031)  Labs:  Recent Labs  11/27/14 1557 11/27/14 2005 11/28/14 0021 11/28/14 0724 11/28/14 1339 11/29/14 0316  HGB 13.1  --   --  13.0  --   --   HCT 38.3  --   --  38.2  --   --   PLT 280  --   --  264  --   --   CREATININE 1.41*  --   --  1.43*  --  1.27*  CKTOTAL  --   --   --   --  58  --   TROPONINI  --  <0.03 <0.03 <0.03  --   --     Estimated Creatinine Clearance: 38 mL/min (by C-G formula based on Cr of 1.27).   Medical History: Past Medical History  Diagnosis Date  . CHF (congestive heart failure)   . LBBB (left bundle branch block)   . A-fib 11/27/2014  . Bradycardia   . Heart disease   . Hyperlipidemia   . Hypertension   . Fatigue   . CAD (coronary artery disease) 2003    LAD stent. Mild disease left  . Family history of adverse reaction to anesthesia     "sister had problem waking up; don't think it was anything serious; she slept for a day"  . On home oxygen therapy     "only at night; ? setting" (11/27/2014)  . GERD (gastroesophageal reflux disease)   . History of hiatal hernia   . Migraine     "very rare now" (11/27/2014)  . Headache     "in the top of my head; have them ~ 2 times/wk" (11/27/2014)  . Osteoarthritis cervical spine   . Anxiety     "some; not treated for it" (11/27/2014)  . Kidney failure     "hx; did not have to be  treated; it got better" (11/27/2014)    Medications:  Prescriptions prior to admission  Medication Sig Dispense Refill Last Dose  . amLODipine-benazepril (LOTREL) 10-20 MG per capsule Take 1 capsule by mouth daily.   11/27/2014 at Unknown time  . atenolol (TENORMIN) 50 MG tablet Take 50 mg by mouth daily.   11/27/2014 at 1000  . Pseudoephedrine HCl (DECONGESTANT PO) Take 1 tablet by mouth as needed (for congestion).   11/26/2014 at Unknown time  . simvastatin (ZOCOR) 80 MG tablet Take 1 tablet (80 mg total) by mouth every evening. 90 tablet 3 11/26/2014 at Unknown time  . ALPRAZolam (XANAX) 0.5 MG tablet Take 1 tablet (0.5 mg total) by mouth 2 (two) times daily. 180 tablet 2   . atenolol (TENORMIN) 25 MG tablet Take 1 tablet (25 mg total) by mouth daily. 90 tablet 3   . citalopram (CELEXA) 20 MG tablet Take 1 tablet (20 mg total) by mouth daily. 90 tablet 0   . estradiol (CLIMARA - DOSED IN MG/24 HR) 0.0375 mg/24hr Place 1 patch (  0.0375 mg total) onto the skin once a week. 12 patch 0   . estradiol (VIVELLE-DOT) 0.0375 MG/24HR Place 1 patch onto the skin once a week. 12 patch 3   . meloxicam (MOBIC) 7.5 MG tablet Take 1 tablet (7.5 mg total) by mouth daily. 90 tablet 0   . omeprazole (PRILOSEC) 20 MG capsule Take 1 capsule (20 mg total) by mouth daily. 90 capsule 3     Assessment: 81 y/o female who presented to the ED with weakness and fatigue for the last week. She was found to be in Afib. CHADS-VASC score is 5. Pharmacy consulted to begin apixaban. Patient is >3 y/o, SCr <1.5, and weight >60 kg. No bleeding noted, CBC is normal.  Goal of Therapy:  Therapeutic anticoagulation Monitor platelets by anticoagulation protocol: Yes   Plan:  - Apixaban 5 mg PO bid - D/c Lovenox SQ for VTE prophylaxis - Monitor for s/sx of bleeding  [facility], Pharm.D., BCPS Clinical Pharmacist Pager: [phone] 11/29/2014 11:59 AM

## 2014-11-29 NOTE — Evaluation (Signed)
Physical Therapy Evaluation Patient Details Name: [name] MRN: [id number] DOB: [birth year] Today's Date: 11/29/2014   History of Present Illness  81 y.o. female admitted to [facility] on 11/27/14 with fatigue, SOB, weight loss.  Pt dx with UTI, but also having boughts of tachcardia, even at rest with rates into the 140s and 150s.  IV HR meds initiated to try to control HR.  Pt with significant PMHx of CHF, BBB, A-fib, bradycardia, HTN, CAD, wears O2 at night, migraine, OA of spine, anxiety, kidney failure, and pt is very HOH.    Clinical Impression  Pt is generally weak and limited by high HR with even limited mobility. HR 89 at rest in bed, 112 seated EOB, and 148 max observed with short distance gait (55').  Walking terminated due to high HR.  Pt will likely need f/u therapy at discharge and is open to [place].  PT will need to further assess of RW is appropriate when HR comes down and she is better able to tolerate more walking.  I will attempt to have PT come by tomorrow to further assess as staffing allows.      Follow Up Recommendations Home health PT    Equipment Recommendations  Rolling walker with 5" wheels    Recommendations for Other Services   NA    Precautions / Restrictions Precautions Precautions: Other (comment) Precaution Comments: monitor HR and check in with RN before session.  Good idea to call central telemetry before seeing pt to monitor HR during session.       Mobility  Bed Mobility Overal bed mobility: Modified Independent             General bed mobility comments: HOB flat, pt using railing for leverage to get to standing.   Transfers Overall transfer level: Needs assistance Equipment used: None Transfers: Sit to/from Stand Sit to Stand: Supervision         General transfer comment: supervision for safety, no reports of lightheadedness in standing.   Ambulation/Gait Ambulation/Gait assistance: Min guard Ambulation Distance (Feet): 50  Feet Assistive device: None Gait Pattern/deviations: Step-through pattern;Shuffle;Staggering right Gait velocity: decreased Gait velocity interpretation: Below normal speed for age/gender General Gait Details: Pt with mildly staggering gait pattern, Min guard assist needed for balance during gait. Mild DOE. O2 via [place] used 2 L and O2 sats stayed in high 90s during gait.  Central telemetry called prior to session to monitor HR and max HR reported during short distance gait was 148 (turned at desk once HR confirmed via monitor to terminate gait- also per RN suggestion).  Pt with 2/4 DOE, and unable to tell that her HR was racing.  HR at rest 89 bpm, seated EOB, 112, and max observed with gait 148 bpm.           Balance Overall balance assessment: Needs assistance Sitting-balance support: Feet supported;No upper extremity supported Sitting balance-Leahy Scale: Good     Standing balance support: No upper extremity supported Standing balance-Leahy Scale: Good Standing balance comment: Some mild balance deficits noted with gait and dynamic activites.                              Pertinent Vitals/Pain Pain Assessment: No/denies pain (reports some nausea this AM without vomiting. )    Home Living Family/patient expects to be discharged to:: Private residence Living Arrangements: Alone Available Help at Discharge: Family;Available PRN/intermittently Type of Home: House Home Access: Stairs  to enter Entrance Stairs-Rails: Right;Left;Can reach both Entrance Stairs-Number of Steps: 4 Home Layout: One level Home Equipment: None      Prior Function Level of Independence: Needs assistance   Gait / Transfers Assistance Needed: walks unassisted, but poor endurance.  Drives, goes to the grocery store.   ADL's / Homemaking Assistance Needed: until recently, she has someone who comes to clean her house  Comments: pt loves to play bridge with her bridge group         Extremity/Trunk Assessment   Upper Extremity Assessment: RUE deficits/detail RUE Deficits / Details: Pt reports right upper extremity pain and dysfunction chronic in nature.  Not tested above 90 degrees, but everything below 90 degrees was functional.          Lower Extremity Assessment: Generalized weakness      Cervical / Trunk Assessment: Other exceptions;Kyphotic  Communication   Communication: HOH  Cognition Arousal/Alertness: Awake/alert Behavior During Therapy: WFL for tasks assessed/performed Overall Cognitive Status: Within Functional Limits for tasks assessed                      General Comments General comments (skin integrity, edema, etc.): Pt reports she is looking for a new housekeeper.  She wants to stay as independent as possible as long as she can.  She is open to home thrapy to help get her strength back at discharge.           Assessment/Plan    PT Assessment Patient needs continued PT services  PT Diagnosis Difficulty walking;Abnormality of gait;Generalized weakness   PT Problem List Decreased strength;Decreased activity tolerance;Decreased mobility;Decreased balance;Decreased knowledge of use of DME;Cardiopulmonary status limiting activity  PT Treatment Interventions DME instruction;Gait training;Stair training;Functional mobility training;Therapeutic activities;Balance training;Therapeutic exercise;Neuromuscular re-education;Patient/family education   PT Goals (Current goals can be found in the Care Plan section) Acute Rehab PT Goals Patient Stated Goal: to keep her independence PT Goal Formulation: With patient Time For Goal Achievement: 12/13/14 Potential to Achieve Goals: Good    Frequency Min 3X/week   Barriers to discharge Decreased caregiver support pt lives alone and has family out of town.        End of Session Equipment Utilized During Treatment: Gait belt;Oxygen Activity Tolerance: Patient limited by fatigue;Treatment limited  secondary to medical complications (Comment) (limited by high HR with short distance gait. ) Patient left: in chair;with call bell/phone within reach Nurse Communication: Other (comment) (we spoke about HR and limited mobility until HR decreases)         Time: 4801-6553 PT Time Calculation (min) (ACUTE ONLY): 36 min   Charges:   PT Evaluation $Initial PT Evaluation Tier I: 1 Procedure PT Treatments $Gait Training: 8-22 mins        Solash Tullo B. Amberleigh Gerken, PT, DPT [phone]   11/29/2014, 10:46 AM

## 2014-11-29 NOTE — Progress Notes (Signed)
Patient Demographics  [name], is a 81 y.o. female, DOB - [birth year], PZW:[id number]  Admit date - 11/27/2014   Admitting Physician Oswald Hillock, MD  Outpatient Primary MD for the patient is [facility], MD  LOS - 2   Chief Complaint  Patient presents with  . Fatigue  . Weakness        Subjective:   [name] today has, No headache, No chest pain, No abdominal pain - Mild Nausea, No new weakness tingling or numbness, No Cough - SOB.    Assessment & Plan    1.Faitgue - likely due to comminution of lack of sleep, UTI and dehydration. Feels much better after a good night sleep on resume Xanax, gently hydrate, TSH stable, UA pending, chest x-ray nonacute, initiate PT and activity. Likely discharge in the morning if stable. Stable CK. On empiric antibiotics for UTI monitor cultures.   2. Chronic anxiety. Resumed on home dose Xanax.   3. History of atrial fibrillation with left bundle branch block and essential hypertension. In RVR despite being on moderate dose better blocker, have added Cardizem drip, hydrate, she is not on anticoagulation, on low-dose aspirin , Cards requested to see. Trop -ve x 3. TSH stable, Echogram pending.   4. Renal failure. No previous BMP in the system. This could be her baseline, we'll hydrate gently and monitor. Check UA.    5. Dyslipidemia. On statin continue.      6.GERD - on PPI   7. UTI. On Cipro. Follow cultures.   8. Mild nausea. Due to obstipation. Bowel regimen and supportive care. Monitor.     Code Status: Full  Family Communication: None  Disposition Plan: Home   Procedures   TTE   Consults   Cards Dr Einar Gip   Medications  Scheduled Meds: . ALPRAZolam  0.5 mg Oral BID  . aspirin  81 mg Oral Daily  . atorvastatin   40 mg Oral q1800  . ciprofloxacin  500 mg Oral BID  . citalopram  20 mg Oral Daily  . diltiazem  10 mg Intravenous Once  . docusate sodium  200 mg Oral BID  . enoxaparin (LOVENOX) injection  40 mg Subcutaneous Q24H  . feeding supplement (ENSURE COMPLETE)  237 mL Oral BID BM  . metoprolol tartrate  50 mg Oral BID  . pantoprazole  40 mg Oral Daily  . polyethylene glycol  17 g Oral BID  . sodium chloride  3 mL Intravenous Q12H   Continuous Infusions: . diltiazem (CARDIZEM) infusion 5 mg/hr (11/29/14 0906)   PRN Meds:.sodium chloride, acetaminophen **OR** [DISCONTINUED] acetaminophen, HYDROcodone-acetaminophen, metoprolol, ondansetron **OR** ondansetron (ZOFRAN) IV, sodium chloride  DVT Prophylaxis  Lovenox    Lab Results  Component Value Date   PLT 264 11/28/2014    Antibiotics     Anti-infectives    Start     Dose/Rate Route Frequency Ordered Stop   11/28/14 1530  ciprofloxacin (CIPRO) tablet 500 mg     500 mg Oral 2 times daily 11/28/14 1450 12/01/14 1959   11/28/14 1500  cefTRIAXone (ROCEPHIN) 1 g in dextrose 5 % 50 mL IVPB - Premix  Status:  Discontinued     1 g100 mL/hr over 30 Minutes Intravenous Every 24 hours 11/28/14 1449  11/28/14 1450          Objective:   Filed Vitals:   11/28/14 2211 11/29/14 0146 11/29/14 0202 11/29/14 0529  BP: 115/61 143/91 137/78 130/57  Pulse: 92 134 59 94  Temp: 98 F (36.7 C)  97.9 F (36.6 C) 97.6 F (36.4 C)  TempSrc: Oral  Oral Oral  Resp: 18  18 18   Height:      Weight:    82.918 kg (182 lb 12.8 oz)  SpO2: 100% 98%  94%    Wt Readings from Last 3 Encounters:  11/29/14 82.918 kg (182 lb 12.8 oz)  02/14/11 92.08 kg (203 lb)     Intake/Output Summary (Last 24 hours) at 11/29/14 1019 Last data filed at 11/29/14 0929  Gross per 24 hour  Intake   1298 ml  Output    550 ml  Net    748 ml     Physical Exam  Awake Alert, Oriented X 3, No new F.N deficits, Normal affect [name].AT,PERRAL Supple Neck,No JVD, No cervical  lymphadenopathy appriciated.  Symmetrical Chest wall movement, Good air movement bilaterally, CTAB Irregular RRR,No Gallops,Rubs or new Murmurs, No Parasternal Heave +ve B.Sounds, Abd Soft, No tenderness, No organomegaly appriciated, No rebound - guarding or rigidity. No Cyanosis, Clubbing or edema, No new Rash or bruise      Data Review   Micro Results No results found for this or any previous visit (from the past 240 hour(s)).  Radiology Reports Dg Chest 2 View  11/27/2014   CLINICAL DATA:  Fatigue and weakness for 1 week. Shortness of breath and right shoulder pain.  EXAM: CHEST  2 VIEW  COMPARISON:  03/03/2009  FINDINGS: Normal heart size calcified atherosclerotic plaque involves the aortic arch. No pleural effusion or edema. No airspace consolidation. Asymmetric elevation of the right hemidiaphragm identified.  IMPRESSION: 1. No acute cardiopulmonary abnormalities.   Electronically Signed   By: Kerby Moors M.D.   On: 11/27/2014 17:10     CBC  Recent Labs Lab 11/27/14 1557 11/28/14 0724  WBC 9.1 7.7  HGB 13.1 13.0  HCT 38.3 38.2  PLT 280 264  MCV 92.5 95.7  MCH 31.6 32.6  MCHC 34.2 34.0  RDW 12.7 12.9  LYMPHSABS 2.3  --   MONOABS 1.2*  --   EOSABS 0.3  --   BASOSABS 0.1  --     Chemistries   Recent Labs Lab 11/27/14 1557 11/28/14 0724 11/29/14 0316  NA 136 140 136  K 3.3* 4.4 4.1  CL 100 102 102  CO2 25 29 26   GLUCOSE 113* 120* 130*  BUN 19 17 19   CREATININE 1.41* 1.43* 1.27*  CALCIUM 8.8 9.1 8.6  MG  --   --  2.0  AST 20 18  --   ALT 14 12  --   ALKPHOS 49 51  --   BILITOT 0.4 0.3  --    ------------------------------------------------------------------------------------------------------------------ estimated creatinine clearance is 38 mL/min (by C-G formula based on Cr of 1.27). ------------------------------------------------------------------------------------------------------------------ No results for input(s): HGBA1C in the last 72  hours. ------------------------------------------------------------------------------------------------------------------ No results for input(s): CHOL, HDL, LDLCALC, TRIG, CHOLHDL, LDLDIRECT in the last 72 hours. ------------------------------------------------------------------------------------------------------------------  Recent Labs  11/27/14 2005  TSH 3.708   ------------------------------------------------------------------------------------------------------------------ No results for input(s): VITAMINB12, FOLATE, FERRITIN, TIBC, IRON, RETICCTPCT in the last 72 hours.  Coagulation profile No results for input(s): INR, PROTIME in the last 168 hours.  No results for input(s): DDIMER in the last 72 hours.  Cardiac Enzymes  Recent Labs Lab 11/27/14 2005 11/28/14 0021 11/28/14 0724  TROPONINI <0.03 <0.03 <0.03   ------------------------------------------------------------------------------------------------------------------ Invalid input(s): POCBNP     Time Spent in minutes   35   Azalee Weimer K M.D on 11/29/2014 at 10:19 AM  Between 7am to 7pm - Pager - [phone]  After 7pm go to [email or web address] - [facility] Office  [phone]

## 2014-11-29 NOTE — Consult Note (Addendum)
CARDIOLOGY CONSULT NOTE  Patient ID: [name] MRN: [id number] DOB/AGE: [birth year] 81 y.o.  Admit date: 11/27/2014 Referring Physician  Lala Lund, MD Primary Physician:  Rachell Cipro, MD Reason for Consultation  A. FIb with RVR  HPI: The patient is a 81 year old female history of Coronary artery disease. She has had PTCA and stenting to her LAD in 2003. This was found during routine examination and abnormal stress test.   Patient was admitted to the hospital after she saw her PCP, with complaints of marked fatigue, generalized weakness. She was found to be in atrial fibrillation which was new. She has been complaining about marked fatigue will past several months to several years however since Thanksgiving of 2014, patient states that he 1 getting up from the chair would be a big chore.  She underwent outpatient nocturnal oximetry, revealing significant desaturation. I had set her up for a sleep evaluation, but unfortunately the appointment never got through. She is presently on nocturnal home oxygen.  She is now admitted with UTI. I was asked to see the patient due to atrial fibrillation with rapid ventricular response. Patient denies any chest pain, denies shortness of breath. She states that over Thanksgiving she had also developed severe cough, fever, chills and pretty much stayed in bed for 10 days but did not seek any medical attention. She still has mild cough that is dry. She denies symptoms to suggest PND or orthopnea. Patient also states that over the past 3 months she has lost about 20 pounds in weight unintentionally.  Past Medical History  Diagnosis Date  . CHF (congestive heart failure)   . LBBB (left bundle branch block)   . A-fib 11/27/2014  . Bradycardia   . Heart disease   . Hyperlipidemia   . Hypertension   . Fatigue   . CAD (coronary artery disease) 2003    LAD stent. Mild disease left  . Family history of adverse reaction to anesthesia     "sister  had problem waking up; don't think it was anything serious; she slept for a day"  . On home oxygen therapy     "only at night; ? setting" (11/27/2014)  . GERD (gastroesophageal reflux disease)   . History of hiatal hernia   . Migraine     "very rare now" (11/27/2014)  . Headache     "in the top of my head; have them ~ 2 times/wk" (11/27/2014)  . Osteoarthritis cervical spine   . Anxiety     "some; not treated for it" (11/27/2014)  . Kidney failure     "hx; did not have to be treated; it got better" (11/27/2014)     Past Surgical History  Procedure Laterality Date  . Abdominal hysterectomy    . Cystocele repair    . Cataract extraction w/ intraocular lens  implant, bilateral Bilateral      History reviewed. No pertinent family history.   Social History: History   Social History  . Marital Status: Divorced    Spouse Name: N/A    Number of Children: N/A  . Years of Education: N/A   Occupational History  . Not on file.   Social History Main Topics  . Smoking status: Never Smoker   . Smokeless tobacco: Never Used  . Alcohol Use: No  . Drug Use: No  . Sexual Activity: No   Other Topics Concern  . Not on file   Social History Narrative  . No narrative on file  Prescriptions prior to admission  Medication Sig Dispense Refill Last Dose  . amLODipine-benazepril (LOTREL) 10-20 MG per capsule Take 1 capsule by mouth daily.   11/27/2014 at Unknown time  . atenolol (TENORMIN) 50 MG tablet Take 50 mg by mouth daily.   11/27/2014 at 1000  . Pseudoephedrine HCl (DECONGESTANT PO) Take 1 tablet by mouth as needed (for congestion).   11/26/2014 at Unknown time  . simvastatin (ZOCOR) 80 MG tablet Take 1 tablet (80 mg total) by mouth every evening. 90 tablet 3 11/26/2014 at Unknown time  . ALPRAZolam (XANAX) 0.5 MG tablet Take 1 tablet (0.5 mg total) by mouth 2 (two) times daily. 180 tablet 2   . atenolol (TENORMIN) 25 MG tablet Take 1 tablet (25 mg total) by mouth daily. 90  tablet 3   . citalopram (CELEXA) 20 MG tablet Take 1 tablet (20 mg total) by mouth daily. 90 tablet 0   . estradiol (CLIMARA - DOSED IN MG/24 HR) 0.0375 mg/24hr Place 1 patch (0.0375 mg total) onto the skin once a week. 12 patch 0   . estradiol (VIVELLE-DOT) 0.0375 MG/24HR Place 1 patch onto the skin once a week. 12 patch 3   . meloxicam (MOBIC) 7.5 MG tablet Take 1 tablet (7.5 mg total) by mouth daily. 90 tablet 0   . omeprazole (PRILOSEC) 20 MG capsule Take 1 capsule (20 mg total) by mouth daily. 90 capsule 3     Scheduled Meds: . ALPRAZolam  0.5 mg Oral BID  . aspirin  81 mg Oral Daily  . atorvastatin  40 mg Oral q1800  . ciprofloxacin  500 mg Oral BID  . citalopram  20 mg Oral Daily  . docusate sodium  200 mg Oral BID  . enoxaparin (LOVENOX) injection  40 mg Subcutaneous Q24H  . feeding supplement (ENSURE COMPLETE)  237 mL Oral BID BM  . metoprolol tartrate  50 mg Oral BID  . pantoprazole  40 mg Oral Daily  . polyethylene glycol  17 g Oral BID  . sodium chloride  3 mL Intravenous Q12H   Continuous Infusions: . diltiazem (CARDIZEM) infusion 5 mg/hr (11/29/14 0906)   PRN Meds:.sodium chloride, acetaminophen **OR** [DISCONTINUED] acetaminophen, HYDROcodone-acetaminophen, metoprolol, ondansetron **OR** ondansetron (ZOFRAN) IV, sodium chloride  ROS: General: Has worsening of chronic fatigue. no fevers/chills/night sweats Eyes: no blurry vision, diplopia, or amaurosis ENT: Hard of hearing, chronic  Resp: Nonproductive cough, denies  wheezing, or hemoptysis CV: no edema or palpitations GI: no abdominal pain, nausea, vomiting, diarrhea, or constipation GU: no dysuria, frequency, or hematuria Skin: no rash Neuro: no headache, numbness, tingling, or weakness of extremities Musculoskeletal: no joint pain or swelling Heme: no bleeding, DVT, or easy bruising Endo: no polydipsia or polyuria    Physical Exam: Blood pressure 130/57, pulse 145, temperature 97.6 F (36.4 C), temperature  source Oral, resp. rate 18, height 5' 5.5" (1.664 m), weight 82.918 kg (182 lb 12.8 oz), SpO2 98 %.    General: Moderately build and mildly obese body habitus who is in no acute distress. Appears younger than stated age. Alert Ox3.  There is no cyanosis. HEENT: normal limits. No JVD.   CARDIAC EXAM: S1 variable, S2 normal, no gallop present. No murmur.   CHEST EXAM: No tenderness of chest wall. LUNGS: Clear to percuss and auscultate.  ABDOMEN: No hepatosplenomegaly. BS normal in all 4 quadrants. Abdomen is non-tender.   EXTREMITY: Full range of movementes, No edema. Large adipose tissue on the legs. MUSCULOSKELETAL EXAM: Intact with full range of  motion in all 4 extremities.   NEUROLOGIC EXAM: Grossly intact without any focal deficits. Alert O x 3.   VASCULAR EXAM: No skin breakdown. Carotids normal. Extremities: Femoral pulse normal. Popliteal pulse normal ; Pedal pulse normal. Otherwise No prominent pulse felt in the abdomen. No varicose veins.  Labs:   Lab Results  Component Value Date   WBC 7.7 11/28/2014   HGB 13.0 11/28/2014   HCT 38.2 11/28/2014   MCV 95.7 11/28/2014   PLT 264 11/28/2014    Recent Labs Lab 11/28/14 0724 11/29/14 0316  NA 140 136  K 4.4 4.1  CL 102 102  CO2 29 26  BUN 17 19  CREATININE 1.43* 1.27*  CALCIUM 9.1 8.6  PROT 5.0*  --   BILITOT 0.3  --   ALKPHOS 51  --   ALT 12  --   AST 18  --   GLUCOSE 120* 130*   Lab Results  Component Value Date   CKTOTAL 58 11/28/2014   TROPONINI <0.03 11/28/2014    Lipid Panel  No results found for: CHOL, TRIG, HDL, CHOLHDL, VLDL, LDLCALC  EKG: 11/27/2014: Atrial fibrillation with rapid response, left bundle branch block. No further analysis due to LBBB.    Radiology: Dg Chest 2 View  11/27/2014   CLINICAL DATA:  Fatigue and weakness for 1 week. Shortness of breath and right shoulder pain.  EXAM: CHEST  2 VIEW  COMPARISON:  03/03/2009  FINDINGS: Normal heart size calcified  atherosclerotic plaque involves the aortic arch. No pleural effusion or edema. No airspace consolidation. Asymmetric elevation of the right hemidiaphragm identified.  IMPRESSION: 1. No acute cardiopulmonary abnormalities.   Electronically Signed   By: Kerby Moors M.D.   On: 11/27/2014 17:10    ASSESSMENT AND PLAN:   1. Atrial fibrillation with rapid ventricular response, suspect onset to be during November 2014. No prior history of atrial fibrillation.  CHA2DS2-VASCScore: Risk Score  5,  Yearly risk of stroke  6.7. Recommendation: ASA No/Anticoagulation Yes  [place] Has Bled: Score. Estimated risk of major bleeding at 1 year with [place] 1.88 to 3.2%  2. Coronary artery disease of native vessel, history of stenting to the LAD in 2003, mild disease of the left coronary arteries. Outpatient tests: Echo- 03/20/13 1. Left ventricular cavity is normal in size. Mild concentric hypertrophy. Abnormal septal wall motion due to left bundle branch block. Normal global wall motion. Lower limits systolic global function. Calculated EF 52%. Doppler evidence of Grade I (impaired) diastolic dysfunction with elevated LV filling pressure. 2. Left atrial cavity is slightly dilated. Mild aneurysmal motion of the interatrial septum. 3. Mild to moderate aortic regurgitation. Mild aortic valve leaflet thickening. 4. Mild calcification of the mitral annulus. Mild mitral regurgitation. Mitral valve inflow A > E ratio. 5. Tricuspid valve structurally normal. Mild tricuspid regurgitation. Mild pulmonary hypertension with PA syst. pressure of 36 mm of Hg. Lexiscan stress 03/22/13: 1. Resting EKG NSR, LBBB. Stress EKG was non diagnostic for ischemia. 2. Perfusion imaging study demonstrated mild soft tissue attenuation consistent with breast attenuation. Inferior wall scar cannot be completely excluded although less likely with mild improvement in perfusion with stress images. The left ventricular systolic function was severely  depressed at 32% by QGS, however visually appears normal. This is a low risk study. Clinical correlation recommended. Compared to a nuclear stress study that was performed in 2008 report, there was anterior breast attenuation artifact. Ejection fraction previously was normal.  3. Chronic fatigue, suspect that she probably has  significant sleep apnea, markedly abnormal oximetry, presently on home oxygen at night. may need formal sleep study. 4. Hyperlipidemia 5. Hypertension 6. Chronic kidney disease, stage III 7. Hyperglycemia  Recommendation: The atrial fibrillation is new onset. She has had acute worsening in chronic fatigue, may be related to new onset of atrial fibrillation that probably occurred in November 2014. She lives independently, still drives, there is no history of fall or bleeding diathesis. Would recommend anticoagulation.  I would recommend starting by Cardizem by mouth, if her heart rate gets controlled, discontinue IV Cardizem, continue beta blocker therapy.  Would recommend starting the patient on Eliquis 5 mg by mouth twice a day. Her elevated heart rate is also due to underlying UTI, patient remains hemodynamically stable without any clinical evidence of acute decompensated diastolic heart failure. If she remains stable I'll see her on Monday otherwise I'll see her tomorrow.  Laverda Page, MD 11/29/2014, 11:25 AM [facility] [place] Pager: [phone] Office: [phone] If no answer Cell [phone]

## 2014-11-29 NOTE — Progress Notes (Signed)
Nutrition Brief Note  Patient identified on the Malnutrition Screening Tool (MST) Report  Wt Readings from Last 15 Encounters:  11/29/14 182 lb 12.8 oz (82.918 kg)  02/14/11 203 lb (92.08 kg)    Body mass index is 29.95 kg/(m^2). Patient meets criteria for Overweight based on current BMI.   Current diet order is Heart Healthy, patient is consuming approximately 85-100% of meals at this time. Labs and medications reviewed.   No nutrition interventions warranted at this time. If nutrition issues arise, please consult RD.   Pryor Ochoa RD, LDN Inpatient Clinical Dietitian Pager: [phone] After Hours Pager: [phone]

## 2014-11-29 NOTE — Progress Notes (Signed)
The patient's HR went up into the 140s for approimately 3-4 minutes.  Her HR then ranged from 95/135 even when lying still in bed for a few minutes.  Fredirick Maudlin was notified.  New orders were given for 5 mg of IV metroprolol.  The RN will carry out the orders.

## 2014-11-30 LAB — URINE CULTURE

## 2014-11-30 LAB — BASIC METABOLIC PANEL
ANION GAP: 5 (ref 5–15)
BUN: 14 mg/dL (ref 6–23)
CO2: 30 mmol/L (ref 19–32)
Calcium: 8.7 mg/dL (ref 8.4–10.5)
Chloride: 101 mEq/L (ref 96–112)
Creatinine, Ser: 1.2 mg/dL — ABNORMAL HIGH (ref 0.50–1.10)
GFR calc Af Amer: 48 mL/min — ABNORMAL LOW (ref >90)
GFR calc non Af Amer: 42 mL/min — ABNORMAL LOW (ref >90)
Glucose, Bld: 127 mg/dL — ABNORMAL HIGH (ref 70–99)
Potassium: 4.7 mmol/L (ref 3.5–5.1)
Sodium: 136 mmol/L (ref 135–145)

## 2014-11-30 MED ORDER — SODIUM CHLORIDE 0.9 % IV SOLN
250.0000 mL | INTRAVENOUS | Status: AC
  Administered 2014-11-30: 250 mL via INTRAVENOUS

## 2014-11-30 NOTE — Progress Notes (Signed)
Patient Demographics  [name], is a 81 y.o. female, DOB - [birth year], ZCH:[id number]  Admit date - 11/27/2014   Admitting Physician Oswald Hillock, MD  Outpatient Primary MD for the patient is [facility] MD  LOS - 3   Chief Complaint  Patient presents with  . Fatigue  . Weakness      Summary   81 year old female with history of chronic fatigue, was brought in with chief complaints of fatigue was worse than her baseline, was found to be in new onset A. fib with RVR at PCP office, workup here was suggested from dehydration and UTI. She's been started on Cardizem and Eliquis by cardiology, echo pending. PT eval likely home health PT upon discharge.   Subjective:   [name] today has, No headache, No chest pain, No abdominal pain - Mild Nausea, No new weakness tingling or numbness, No Cough - SOB.    Assessment & Plan    1.Faitgue - likely due to combination of New onset Afib, lack of sleep, UTI and dehydration. Feels much better after a good night sleep on resume Xanax, gently hydrate, TSH stable, UA pending, chest x-ray nonacute, initiate PT and activity. Likely discharge in the morning if stable. Stable CK. On empiric antibiotics for UTI monitor cultures. Afib Rx as below.    2. Chronic anxiety. Resumed on home dose Xanax.    3. New onset atrial fibrillation with left bundle branch block and essential hypertension. In rate control on high-dose Cardizem, rate slightly slow at rest this morning so will hold off beta blocker, has been started on Eliquis by cardiology , Cards following. Trop -ve x 3. TSH stable, Echogram pending.    4. Renal failure. No previous BMP in the system. This could be her baseline, we'll hydrate gently and monitor. Check UA.    5. Dyslipidemia. On  statin continue.      6.GERD - on PPI    7. UTI. On Cipro. Follow cultures.    8. Mild nausea. Due to obstipation. Bowel regimen and supportive care. Monitor.     Code Status: Full  Family Communication: None  Disposition Plan: Home   Procedures   TTE Pending   Consults   Cards Dr Einar Gip   Medications  Scheduled Meds: . ALPRAZolam  0.5 mg Oral BID  . apixaban  5 mg Oral BID  . aspirin  81 mg Oral Daily  . atorvastatin  40 mg Oral q1800  . ciprofloxacin  500 mg Oral BID  . citalopram  20 mg Oral Daily  . diltiazem  240 mg Oral Daily  . docusate sodium  200 mg Oral BID  . feeding supplement (ENSURE COMPLETE)  237 mL Oral BID BM  . metoprolol tartrate  50 mg Oral BID  . pantoprazole  40 mg Oral Daily  . polyethylene glycol  17 g Oral BID  . sodium chloride  3 mL Intravenous Q12H   Continuous Infusions: . sodium chloride     PRN Meds:.acetaminophen **OR** [DISCONTINUED] acetaminophen, HYDROcodone-acetaminophen, metoprolol, ondansetron **OR** ondansetron (ZOFRAN) IV, sodium chloride  DVT Prophylaxis  Lovenox    Lab Results  Component Value Date   PLT 264 11/28/2014    Antibiotics     Anti-infectives  Start     Dose/Rate Route Frequency Ordered Stop   11/28/14 1530  ciprofloxacin (CIPRO) tablet 500 mg     500 mg Oral 2 times daily 11/28/14 1450 12/01/14 1959   11/28/14 1500  cefTRIAXone (ROCEPHIN) 1 g in dextrose 5 % 50 mL IVPB - Premix  Status:  Discontinued     1 g100 mL/hr over 30 Minutes Intravenous Every 24 hours 11/28/14 1449 11/28/14 1450          Objective:   Filed Vitals:   11/29/14 1031 11/29/14 1327 11/29/14 2000 11/30/14 0500  BP:  111/58 135/60 105/57  Pulse: 145 68 94 44  Temp:  97.5 F (36.4 C) 97.6 F (36.4 C) 97.9 F (36.6 C)  TempSrc:  Axillary    Resp:  18 14 12   Height:      Weight:    84.777 kg (186 lb 14.4 oz)  SpO2: 98% 100% 97% 100%    Wt Readings from Last 3 Encounters:  11/30/14 84.777 kg (186 lb 14.4  oz)  02/14/11 92.08 kg (203 lb)     Intake/Output Summary (Last 24 hours) at 11/30/14 1100 Last data filed at 11/30/14 1018  Gross per 24 hour  Intake 1271.67 ml  Output   1450 ml  Net -178.33 ml     Physical Exam  Awake Alert, Oriented X 3, No new F.N deficits, Normal affect [place].AT,PERRAL Supple Neck,No JVD, No cervical lymphadenopathy appriciated.  Symmetrical Chest wall movement, Good air movement bilaterally, CTAB Irregular RRR,No Gallops,Rubs or new Murmurs, No Parasternal Heave +ve B.Sounds, Abd Soft, No tenderness, No organomegaly appriciated, No rebound - guarding or rigidity. No Cyanosis, Clubbing or edema, No new Rash or bruise      Data Review   Micro Results No results found for this or any previous visit (from the past 240 hour(s)).  Radiology Reports Dg Chest 2 View  11/27/2014   CLINICAL DATA:  Fatigue and weakness for 1 week. Shortness of breath and right shoulder pain.  EXAM: CHEST  2 VIEW  COMPARISON:  03/03/2009  FINDINGS: Normal heart size calcified atherosclerotic plaque involves the aortic arch. No pleural effusion or edema. No airspace consolidation. Asymmetric elevation of the right hemidiaphragm identified.  IMPRESSION: 1. No acute cardiopulmonary abnormalities.   Electronically Signed   By: Kerby Moors M.D.   On: 11/27/2014 17:10     CBC  Recent Labs Lab 11/27/14 1557 11/28/14 0724  WBC 9.1 7.7  HGB 13.1 13.0  HCT 38.3 38.2  PLT 280 264  MCV 92.5 95.7  MCH 31.6 32.6  MCHC 34.2 34.0  RDW 12.7 12.9  LYMPHSABS 2.3  --   MONOABS 1.2*  --   EOSABS 0.3  --   BASOSABS 0.1  --     Chemistries   Recent Labs Lab 11/27/14 1557 11/28/14 0724 11/29/14 0316 11/30/14 0400  NA 136 140 136 136  K 3.3* 4.4 4.1 4.7  CL 100 102 102 101  CO2 25 29 26 30   GLUCOSE 113* 120* 130* 127*  BUN 19 17 19 14   CREATININE 1.41* 1.43* 1.27* 1.20*  CALCIUM 8.8 9.1 8.6 8.7  MG  --   --  2.0  --   AST 20 18  --   --   ALT 14 12  --   --   ALKPHOS 49  51  --   --   BILITOT 0.4 0.3  --   --    ------------------------------------------------------------------------------------------------------------------ estimated creatinine clearance is 40.6 mL/min (by  C-G formula based on Cr of 1.2). ------------------------------------------------------------------------------------------------------------------ No results for input(s): HGBA1C in the last 72 hours. ------------------------------------------------------------------------------------------------------------------ No results for input(s): CHOL, HDL, LDLCALC, TRIG, CHOLHDL, LDLDIRECT in the last 72 hours. ------------------------------------------------------------------------------------------------------------------  Recent Labs  11/27/14 2005  TSH 3.708   ------------------------------------------------------------------------------------------------------------------ No results for input(s): VITAMINB12, FOLATE, FERRITIN, TIBC, IRON, RETICCTPCT in the last 72 hours.  Coagulation profile No results for input(s): INR, PROTIME in the last 168 hours.  No results for input(s): DDIMER in the last 72 hours.  Cardiac Enzymes  Recent Labs Lab 11/27/14 2005 11/28/14 0021 11/28/14 0724  TROPONINI <0.03 <0.03 <0.03   ------------------------------------------------------------------------------------------------------------------ Invalid input(s): POCBNP     Time Spent in minutes   35   SINGH,PRASHANT K M.D on 11/30/2014 at 11:00 AM  Between 7am to 7pm - Pager - [phone]  After 7pm go to [email or web address] - [place] [facility] Office  [phone]

## 2014-11-30 NOTE — Progress Notes (Signed)
  Echocardiogram 2D Echocardiogram has been performed.  [name] 11/30/2014, 11:54 AM

## 2014-11-30 NOTE — Progress Notes (Addendum)
The patient's HR went up once into the 140s briefly overnight at the beginning of the shift when she ambulated to the bathroom.  She had just been given her PO metoprolol and it did not have time to take affect yet.  When at rest, her heart rate was anywhere from the 60s-low 100s overnight.  Her HR stayed below 120 when she got up on the scale this morning to be weighed.  She complained of pain in her right shoulder, which was relieved with Vicodin and heat.

## 2014-11-30 NOTE — Progress Notes (Signed)
Physical Therapy Treatment Patient Details Name: [name] MRN: [id number] DOB: [birth year] Today's Date: 11/30/2014    History of Present Illness 81 y.o. female admitted to [facility] on 11/27/14 with fatigue, SOB, weight loss.  Pt dx with UTI, but also having boughts of tachcardia, even at rest with rates into the 140s and 150s.  IV HR meds initiated to try to control HR.  Pt with significant PMHx of CHF, BBB, A-fib, bradycardia, HTN, CAD, wears O2 at night, migraine, OA of spine, anxiety, kidney failure, and pt is very HOH.      PT Comments    Patient progressing with mobility, tolerated increased distance but demonstrates instability with fatigue, may benefit from use of RW despite reluctance, to improve safety with mobility. HR did elevate to 110s with SV rhythm but improved to 80s with rest, nsg aware. Will continue to see and progress as tolerated.   Follow Up Recommendations  Home health PT     Equipment Recommendations  Rolling walker with 5" wheels    Recommendations for Other Services       Precautions / Restrictions Precautions Precautions: Other (comment) Precaution Comments: monitor HR and check in with RN before session.  Good idea to call central telemetry before seeing pt to monitor HR during session.     Mobility  Bed Mobility Overal bed mobility: Modified Independent             General bed mobility comments: HOB flat, pt using railing for leverage to get to standing.   Transfers Overall transfer level: Needs assistance Equipment used: None Transfers: Sit to/from Stand Sit to Stand: Supervision         General transfer comment: supervision for safety, no reports of lightheadedness in standing.   Ambulation/Gait Ambulation/Gait assistance: Min assist (increased assist with fatigue) Ambulation Distance (Feet): 190 Feet Assistive device: 1 person hand held assist (initially none, then required HHA) Gait Pattern/deviations: Step-through  pattern;Shuffle;Staggering right Gait velocity: decreased Gait velocity interpretation: Below normal speed for age/gender General Gait Details: Patient required 3 standing rest breaks and increased physical assist to maintain balance with increased fatigue, may benefit from use of RW next session. OF NOTE: HR elevated to 110s with activity and noted SV rhythm, improved back to sinus and HR in 80s after several minutes rest, nsg aware   Stairs            Wheelchair Mobility    Modified Rankin (Stroke Patients Only)       Balance     Sitting balance-Leahy Scale: Good       Standing balance-Leahy Scale: Good                      Cognition Arousal/Alertness: Awake/alert Behavior During Therapy: WFL for tasks assessed/performed Overall Cognitive Status: Within Functional Limits for tasks assessed                      Exercises      General Comments        Pertinent Vitals/Pain Pain Assessment: 0-10 Pain Score: 5  Pain Location: right shoulder pain Pain Descriptors / Indicators: Aching Pain Intervention(s): Limited activity within patient's tolerance;Monitored during session    Home Living                      Prior Function            PT Goals (current goals can now be found in the  care plan section) Acute Rehab PT Goals Patient Stated Goal: to keep her independence PT Goal Formulation: With patient Time For Goal Achievement: 12/13/14 Potential to Achieve Goals: Good Progress towards PT goals: Progressing toward goals    Frequency  Min 3X/week    PT Plan Current plan remains appropriate    Co-evaluation             End of Session Equipment Utilized During Treatment: Gait belt;Oxygen Activity Tolerance: Patient limited by fatigue Patient left: in bed;with call bell/phone within reach;with family/visitor present (seated EOB with family)     Time: 3403-7096 PT Time Calculation (min) (ACUTE ONLY): 24 min  Charges:   $Gait Training: 8-22 mins $Therapeutic Activity: 8-22 mins                    G CodesDuncan Dull 2014/12/13, 3:05 PM Alben Deeds, [place] DPT  [phone]

## 2014-12-01 DIAGNOSIS — I4891 Unspecified atrial fibrillation: Principal | ICD-10-CM

## 2014-12-01 MED ORDER — ATENOLOL 50 MG PO TABS
50.0000 mg | ORAL_TABLET | Freq: Two times a day (BID) | ORAL | Status: DC
  Administered 2014-12-01 – 2014-12-02 (×3): 50 mg via ORAL
  Filled 2014-12-01 (×4): qty 1

## 2014-12-01 NOTE — Progress Notes (Addendum)
Subjective:  Patient slept in the bed, no symptoms to suggest PND or orthopnea. States that she feels slightly better this morning, still has fatigue that is chronic.  Objective:  Vital Signs in the last 24 hours: Temp:  [98 F (36.7 C)-98.6 F (37 C)] 98.6 F (37 C) (01/04 0623) Pulse Rate:  [77-112] 83 (01/04 0623) Resp:  [16-18] 18 (01/04 0623) BP: (111-119)/(58-85) 116/85 mmHg (01/04 0623) SpO2:  [94 %-96 %] 94 % (01/04 0623) Weight:  [85.231 kg (187 lb 14.4 oz)] 85.231 kg (187 lb 14.4 oz) (01/04 0623)  Intake/Output from previous day: 01/03 0701 - 01/04 0700 In: 1510 [P.O.:1510] Out: 1450 [Urine:1450]  Physical Exam:   General appearance: Moderately build and mildly obese body habitus who is in no acute distress. Appears younger than stated age. Alert Ox3.  There is no cyanosis. HEENT: normal limits. No JVD.   CARDIAC EXAM: S1 variable, S2 normal, no gallop present. No murmur. Distant heart sounds  CHEST EXAM: No tenderness of chest wall. LUNGS: Clear to percuss and auscultate.  ABDOMEN: No hepatosplenomegaly. BS normal in all 4 quadrants. Abdomen is non-tender.   EXTREMITY: Full range of movementes, No edema. Large adipose tissue on the legs. MUSCULOSKELETAL EXAM: Intact with full range of motion in all 4 extremities.   NEUROLOGIC EXAM: Grossly intact without any focal deficits. Alert O x 3.   VASCULAR EXAM: No skin breakdown. Carotids normal. Extremities: Femoral pulse normal. Popliteal pulse normal ; Pedal pulse normal. Otherwise No prominent pulse felt in the abdomen. No varicose veins.  Lab Results: BMP  Recent Labs  11/28/14 0724 11/29/14 0316 11/30/14 0400  NA 140 136 136  K 4.4 4.1 4.7  CL 102 102 101  CO2 29 26 30   GLUCOSE 120* 130* 127*  BUN 17 19 14   CREATININE 1.43* 1.27* 1.20*  CALCIUM 9.1 8.6 8.7  GFRNONAA 34* 39* 42*  GFRAA 39* 45* 48*    CBC  Recent Labs Lab 11/27/14 1557 11/28/14 0724  WBC 9.1 7.7  RBC 4.14 3.99   HGB 13.1 13.0  HCT 38.3 38.2  PLT 280 264  MCV 92.5 95.7  MCH 31.6 32.6  MCHC 34.2 34.0  RDW 12.7 12.9  LYMPHSABS 2.3  --   MONOABS 1.2*  --   EOSABS 0.3  --   BASOSABS 0.1  --     HEMOGLOBIN A1C No results found for: HGBA1C, MPG  Cardiac Panel (last 3 results)  Recent Labs  11/27/14 2005 11/28/14 0021 11/28/14 0724 11/28/14 1339  CKTOTAL  --   --   --  50  TROPONINI <0.03 <0.03 <0.03  --     BNP (last 3 results) No results for input(s): PROBNP in the last 8760 hours.  TSH  Recent Labs  11/27/14 2005  TSH 3.708    CHOLESTEROL No results for input(s): CHOL in the last 8760 hours.  Hepatic Function Panel  Recent Labs  11/27/14 1557 11/28/14 0724  PROT 6.2 5.0*  ALBUMIN 3.1* 3.1*  AST 20 18  ALT 14 12  ALKPHOS 49 51  BILITOT 0.4 0.3    Imaging: Imaging results have been reviewed  Cardiac Studies:  EKG: 11/27/2014: Atrial fibrillation with rapid ventricle response, left bundle branch block. No further analysis due to LBBB.  Echocardiogram 11/30/2013: Left ventricle: The cavity size was normal. There was mild concentric hypertrophy. Systolic function was normal. The estimated ejection fraction was in the range of 55% to 60%. The study is not technically sufficient to allow evaluation  of LV diastolic function.  - Ventricular septum: Septal motion showed abnormal function. These changes are consistent with a left bundle branch block. - Aortic valve: There was mild regurgitation. - Mitral valve: Normal-sized, calcified annulus. Mildly calcifiedleaflets . There was moderate to severe regurgitation. - Left atrium: The atrium was moderately to severely dilated. - Right ventricle: The cavity size was mildly dilated. - Right atrium: The atrium was moderately to severely dilated. Atrial septum: No defect or patent foramen ovale was identified. - Tricuspid valve: Mildly dilated annulus. There was moderateregurgitation. Pulmonary arteries: PA peak  pressure: 53 mm Hg(S).  Compared to 03/20/2013 of his echocardiogram, mitral regurgitation is new, tricuspid regurgitation worse, pulmonary hypertension worse. Otherwise no significant change.  Assessment/Plan:  1. Atrial fibrillation with rapid ventricular response, suspect onset to be during November 2014. No prior history of atrial fibrillation.  CHA2DS2-VASCScore: Risk Score 5, Yearly risk of stroke 6.7. Recommendation: ASA No/Anticoagulation Yes  [place] Has Bled: Score. Estimated risk of major bleeding at 1 year with [place] 1.88 to 3.2%  2. Coronary artery disease of native vessel, history of stenting to the LAD in 2003, mild disease of the left coronary arteries. Outpatient tests: Lexiscan stress 03/22/13: 1. Resting EKG NSR, LBBB. Stress EKG was non diagnostic for ischemia. 2. Perfusion imaging study demonstrated mild soft tissue attenuation consistent with breast attenuation. Inferior wall scar cannot be completely excluded although less likely with mild improvement in perfusion with stress images. The left ventricular systolic function was severely depressed at 32% by QGS, however visually appears normal. This is a low risk study. Clinical correlation recommended. Compared to a nuclear stress study that was performed in 2008 report, there was anterior breast attenuation artifact. Ejection fraction previously was normal.  3. Chronic fatigue, suspect that she probably has significant sleep apnea, markedly abnormal oximetry, presently on home oxygen at night. may need formal sleep study. 4. Hyperlipidemia 5. Hypertension 6. Chronic kidney disease, stage III 7. Hyperglycemia  Recommendation: I have restarted atenolol 50 mg by mouth twice a day for better rate control. Otherwise from cardiac standpoint she seems to be doing well, although has significant valvular heart disease, would not pursue further evaluation at this point. Clinically she does not appear to be in any decompensated heart  failure. I'll continue to follow. I have discontinued aspirin to reduce the risk of bleed, patient on Eliquis for atrial fibrillation.    [name], M.D. 12/01/2014, 7:51 AM [facility] Pager: [phone] Office: [phone] If no answer: [phone]

## 2014-12-01 NOTE — Discharge Instructions (Signed)

## 2014-12-01 NOTE — Progress Notes (Signed)
[facility] PROGRESS NOTE  [name] FMB:[id number] DOB: 04-08-1932 DOA: 11/27/2014 PCP: Rachell Cipro, MD  Assessment/Plan: 1. Atrial fibrillation with rapid ventricular response -Now rate controlled as she was initially started on IV Cardizem and then transition to oral Cardizem at 240 mg by mouth daily. Cardiology adding atenolol 50 mg by mouth twice a day for better heart rate control. -Transthoracic echocardiogram performed on 11/30/2014 showing EF 55-60%  2.  Urinary tract infection -Suspect underlying urinary tract infection contributing to generalized weakness and functional decline -Urine cultures grew Klebsiella pneumoniae, this organism being pan susceptible -Continue ciprofloxacin 500 mg by mouth twice a day  3.  Hypertension -Stable -Monitor blood pressures coarsely with the addition of atenolol  4.  Stage III chronic kidney disease -Kidney function stable, a.m. labs showing creatinine 1.2 with BUN of 14  Code Status: Full code Family Communication:  Disposition Plan: Anticipate discharge in the next 24-48 hours   Consultants:  Cardiology  Antibiotics:  Cipro 500 mg by mouth twice a day  HPI/Subjective: Patient is a pleasant 81 year old female with a past medical history of diastolic congestive heart failure having last transthoracic echocardiogram performed on 11/30/2014 which showed EF of 55-60%, admitted to the medicine service on 11/27/2014 presenting with generalized weakness, fatigue, shortness of breath. Lab work revealed the presence of urinary tract infection. CXR did not show acute cardiopulmonary abnormalities. During this hospitalization patient was found to be in A. fib with RVR and started on a Cardizem drip. A. fib with RVR new diagnosis. It is possible that chronic fatigue may be related to atrial fibrillation. Dr Einar Gip of cardiology was consulted. She was transitioned to oral Cardizem at 240 mg by mouth daily. Cardiology recommending  restarting atenolol at 50 mg by mouth twice a day. Patient showed slow gradual improvement.  Objective: Filed Vitals:   12/01/14 1411  BP: 108/56  Pulse: 53  Temp: 98.2 F (36.8 C)  Resp: 16    Intake/Output Summary (Last 24 hours) at 12/01/14 1801 Last data filed at 12/01/14 1411  Gross per 24 hour  Intake   1340 ml  Output   2000 ml  Net   -660 ml   Filed Weights   11/29/14 0529 11/30/14 0500 12/01/14 0623  Weight: 82.918 kg (182 lb 12.8 oz) 84.777 kg (186 lb 14.4 oz) 85.231 kg (187 lb 14.4 oz)    Exam:   General:  Patient is in no acute distress awake alert oriented  Cardiovascular: Irregular rate and rhythm normal S1-S2  Respiratory: Normal respiratory effort lungs are clear  Abdomen: Soft nontender nondistended  Musculoskeletal: No edema  Data Reviewed: Basic Metabolic Panel:  Recent Labs Lab 11/27/14 1557 11/28/14 0724 11/29/14 0316 11/30/14 0400  NA 136 140 136 136  K 3.3* 4.4 4.1 4.7  CL 100 102 102 101  CO2 25 29 26 30   GLUCOSE 113* 120* 130* 127*  BUN 19 17 19 14   CREATININE 1.41* 1.43* 1.27* 1.20*  CALCIUM 8.8 9.1 8.6 8.7  MG  --   --  2.0  --    Liver Function Tests:  Recent Labs Lab 11/27/14 1557 11/28/14 0724  AST 20 18  ALT 14 12  ALKPHOS 49 51  BILITOT 0.4 0.3  PROT 6.2 5.0*  ALBUMIN 3.1* 3.1*   No results for input(s): LIPASE, AMYLASE in the last 168 hours. No results for input(s): AMMONIA in the last 168 hours. CBC:  Recent Labs Lab 11/27/14 1557 11/28/14 0724  WBC 9.1 7.7  NEUTROABS 5.3  --  HGB 13.1 13.0  HCT 38.3 38.2  MCV 92.5 95.7  PLT 280 264   Cardiac Enzymes:  Recent Labs Lab 11/27/14 2005 11/28/14 0021 11/28/14 0724 11/28/14 1339  CKTOTAL  --   --   --  70  TROPONINI <0.03 <0.03 <0.03  --    BNP (last 3 results) No results for input(s): PROBNP in the last 8760 hours. CBG: No results for input(s): GLUCAP in the last 168 hours.  Recent Results (from the past 240 hour(s))  Urine culture      Status: None   Collection Time: 11/28/14 11:53 AM  Result Value Ref Range Status   Specimen Description URINE, RANDOM  Final   Special Requests NONE  Final   Colony Count   Final    >=100,000 COLONIES/ML Performed at [facility]   Final    KLEBSIELLA PNEUMONIAE Performed at [facility]    Report Status 11/30/2014 FINAL  Final   Organism ID, Bacteria KLEBSIELLA PNEUMONIAE  Final      Susceptibility   Klebsiella pneumoniae - MIC*    AMPICILLIN RESISTANT      CEFAZOLIN <=4 SENSITIVE Sensitive     CEFTRIAXONE <=1 SENSITIVE Sensitive     CIPROFLOXACIN <=0.25 SENSITIVE Sensitive     GENTAMICIN <=1 SENSITIVE Sensitive     LEVOFLOXACIN <=0.12 SENSITIVE Sensitive     NITROFURANTOIN <=16 SENSITIVE Sensitive     TOBRAMYCIN <=1 SENSITIVE Sensitive     TRIMETH/SULFA <=20 SENSITIVE Sensitive     PIP/TAZO <=4 SENSITIVE Sensitive     * KLEBSIELLA PNEUMONIAE     Studies: No results found.  Scheduled Meds: . ALPRAZolam  0.5 mg Oral BID  . apixaban  5 mg Oral BID  . atenolol  50 mg Oral BID  . atorvastatin  40 mg Oral q1800  . citalopram  20 mg Oral Daily  . diltiazem  240 mg Oral Daily  . docusate sodium  200 mg Oral BID  . feeding supplement (ENSURE COMPLETE)  237 mL Oral BID BM  . pantoprazole  40 mg Oral Daily  . polyethylene glycol  17 g Oral BID  . sodium chloride  3 mL Intravenous Q12H   Continuous Infusions:   Principal Problem:   AKI (acute kidney injury) Active Problems:   Anxiety state   Essential hypertension   Coronary atherosclerosis   SOB (shortness of breath)   Atrial fibrillation with RVR    Time spent: 20 minutes    Kelvin Cellar  [facility] Pager [phone]. If 7PM-7AM, please contact night-coverage at [email or web address], password [facility] 12/01/2014, 6:01 PM  LOS: 4 days

## 2014-12-01 NOTE — Care Management Note (Signed)
    Page 1 of 2   12/02/2014     6:38:32 PM CARE MANAGEMENT NOTE 12/02/2014  Patient:  [name], [name]   Account Number:  [id number]  Date Initiated:  12/01/2014  Documentation initiated by:  AMERSON,JULIE  Subjective/Objective Assessment:   Pt adm on 11/27/14 with new Afib, AKI and UTI.  PTA, pt lives alone, has son nearby.  Pt states she still drives, and normally is independent.     Action/Plan:   PT recommending HH at dc.  Pt given list of [place] providers to review with son.  Will follow for orders.   Anticipated DC Date:  12/02/2014   Anticipated DC Plan:  [place]  CM consult      [facility]  HOME HEALTH   Choice offered to / List presented to:  C-1 Patient   DME arranged  [facility]assie Moselle      DME agency  [place] arranged  HH-1 RN  [place] [facility]      [place].   Status of service:  Completed, signed off Medicare Important Message given?  YES (If response is "NO", the following Medicare IM given date [place] will be blank) Date Medicare IM given:  12/01/2014 Medicare IM given by:  AMERSON,JULIE Date Additional Medicare IM given:   Additional Medicare IM given by:    Discharge Disposition:  [place]  Per UR Regulation:  Reviewed for med. necessity/level of care/duration of stay  If discussed at [place] of Stay Meetings, dates discussed:    Comments:  Kerrin Markman RN, BSN, MSHL, CCM  Nurse - Case Manager,  (Unit [place]) [phone]  12/02/2014 [facility] / Butch Penny and Jerm[facility] notified of [place] order and RW order.

## 2014-12-02 MED ORDER — APIXABAN 5 MG PO TABS: 5.0000 mg | ORAL_TABLET | Freq: Two times a day (BID) | ORAL | Status: DC

## 2014-12-02 MED ORDER — ACETAMINOPHEN 325 MG PO TABS: 650.0000 mg | ORAL_TABLET | Freq: Four times a day (QID) | ORAL | Status: AC | PRN

## 2014-12-02 MED ORDER — HYDROCODONE-ACETAMINOPHEN 5-325 MG PO TABS: 1.0000 | ORAL_TABLET | Freq: Four times a day (QID) | ORAL | Status: DC | PRN

## 2014-12-02 MED ORDER — DILTIAZEM HCL ER COATED BEADS 240 MG PO CP24: 240.0000 mg | ORAL_CAPSULE | Freq: Every day | ORAL | Status: DC

## 2014-12-02 NOTE — Discharge Summary (Signed)
Physician Discharge Summary  [name] VPX:[id number] DOB: 12-04-1931 DOA: 11/27/2014  PCP: Rachell Cipro, MD  Admit date: 11/27/2014 Discharge date: 12/02/2014  Time spent: 35 minutes  Recommendations for Outpatient Follow-up:  1. Patient was set up with [place] services for RN and PT 2. Please follow up on BMP and CBC on hospital follow up visit. 3. She was discharged on [place] therapy  Discharge Diagnoses:  Principal Problem:   AKI (acute kidney injury) Active Problems:   Anxiety state   Essential hypertension   Coronary atherosclerosis   SOB (shortness of breath)   Atrial fibrillation with RVR   Discharge Condition: Stable  Diet recommendation: Heart healthy  Filed Weights   12/01/14 0623 12/02/14 0546 12/02/14 0649  Weight: 85.231 kg (187 lb 14.4 oz) 84.1 kg (185 lb 6.5 oz) 85.9 kg (189 lb 6 oz)    History of present illness:  81 year old female who  has a past medical history of CHF (congestive heart failure); BBB (bundle branch block); A-fib; Bradycardia; Heart disease; Hyperlipidemia; Hypertension; Fatigue; Osteoarthritis cervical spine; Kidney failure; Anxiety; and CAD (coronary artery disease). Today comes to the hospital with chief complaint of fatigue and weakness for past 1 week. She also endorses some shortness of breath when she walks, though she has lost 13 pounds since her weight in July. She denies chest pain, but complains of right shoulder pain. Patient says that she was recently taken off Xanax by her cardiologist which she has been taking since 2012. After stopping Xanax patient is having trouble sleeping and is awake 2-3 hours at night in bed. She denies nausea vomiting or diarrhea. Does complain of some dry heaves yesterday. She also complains of urgency of urination. Denies dysuria. In the ED patient was found to have mild elevation of BNP 242 and received 1 dose of Lasix. Her creatinine is 1.41. EKG shows atrial fibrillation with left bundle  branch block. Cardiac enzymes are negative in the ED.  Hospital Course:  Patient is a pleasant 81 year old female with a past medical history of diastolic congestive heart failure having last transthoracic echocardiogram performed on 11/30/2014 which showed EF of 55-60%, admitted to the medicine service on 11/27/2014 presenting with generalized weakness, fatigue, shortness of breath. Lab work revealed the presence of urinary tract infection. CXR did not show acute cardiopulmonary abnormalities. During this hospitalization patient was found to be in A. fib with RVR and started on a Cardizem drip. A. fib with RVR new diagnosis. It is possible that chronic fatigue may be related to atrial fibrillation. Dr Einar Gip of cardiology was consulted. She was transitioned to oral Cardizem at 240 mg by mouth daily. Cardiology recommending restarting atenolol at 50 mg by mouth twice a day. Patient showed slow gradual improvement.  Atrial fibrillation with rapid ventricular response -Now rate controlled as she was initially started on IV Cardizem and then transition to oral Cardizem at 240 mg by mouth daily. -Cardiology adding atenolol 50 mg by mouth twice a day for better heart rate control. -Transthoracic echocardiogram performed on 11/30/2014 showing EF 55-60% -Will discharge on abixaban therapy.   2. Urinary tract infection -Suspect underlying urinary tract infection contributing to generalized weakness and functional decline -Urine cultures grew Klebsiella pneumoniae, this organism being pan susceptible -Treated with Cipro  3. Hypertension -Follow up on blood pressures given recent changes to her antihypertensives  -Discharged on Atenolol and Cardizem  4. Stage III chronic kidney disease -Kidney function stable, a.m. labs showing creatinine 1.2 with BUN of 14  Consultations:  Cardiology  Discharge Exam: Filed Vitals:   12/02/14 0649  BP: 131/51  Pulse: 64  Temp: 98.8 F (37.1 C)  Resp: 18      General: Patient is in no acute distress awake alert oriented  Cardiovascular: Irregular rate and rhythm normal S1-S2  Respiratory: Normal respiratory effort lungs are clear  Abdomen: Soft nontender nondistended  Musculoskeletal: No edema  Discharge Instructions   Discharge Instructions    (HEART FAILURE PATIENTS) Call MD:  Anytime you have any of the following symptoms: 1) 3 pound weight gain in 24 hours or 5 pounds in 1 week 2) shortness of breath, with or without a dry hacking cough 3) swelling in the hands, feet or stomach 4) if you have to sleep on extra pillows at night in order to breathe.    Complete by:  As directed      Call MD for:  difficulty breathing, headache or visual disturbances    Complete by:  As directed      Call MD for:  extreme fatigue    Complete by:  As directed      Call MD for:  hives    Complete by:  As directed      Call MD for:  persistant dizziness or light-headedness    Complete by:  As directed      Call MD for:  persistant nausea and vomiting    Complete by:  As directed      Call MD for:  redness, tenderness, or signs of infection (pain, swelling, redness, odor or green/yellow discharge around incision site)    Complete by:  As directed      Call MD for:  severe uncontrolled pain    Complete by:  As directed      Call MD for:  temperature >100.4    Complete by:  As directed      Diet - low sodium heart healthy    Complete by:  As directed      Increase activity slowly    Complete by:  As directed           Current Discharge Medication List    START taking these medications   Details  acetaminophen (TYLENOL) 325 MG tablet Take 2 tablets (650 mg total) by mouth every 6 (six) hours as needed for mild pain (or Fever >/= 101). Qty: 30 tablet, Refills: 1    apixaban (ELIQUIS) 5 MG TABS tablet Take 1 tablet (5 mg total) by mouth 2 (two) times daily. Qty: 60 tablet, Refills: 1    diltiazem (CARDIZEM CD) 240 MG 24 hr capsule Take 1  capsule (240 mg total) by mouth daily. Qty: 30 capsule, Refills: 1    HYDROcodone-acetaminophen (NORCO/VICODIN) 5-325 MG per tablet Take 1 tablet by mouth every 6 (six) hours as needed for moderate pain. Qty: 10 tablet, Refills: 0      CONTINUE these medications which have NOT CHANGED   Details  atenolol (TENORMIN) 50 MG tablet Take 50 mg by mouth daily.    simvastatin (ZOCOR) 80 MG tablet Take 1 tablet (80 mg total) by mouth every evening. Qty: 90 tablet, Refills: 3    citalopram (CELEXA) 20 MG tablet Take 1 tablet (20 mg total) by mouth daily. Qty: 90 tablet, Refills: 0    estradiol (CLIMARA - DOSED IN MG/24 HR) 0.0375 mg/24hr Place 1 patch (0.0375 mg total) onto the skin once a week. Qty: 12 patch, Refills: 0    estradiol (VIVELLE-DOT) 0.0375 MG/24HR  Place 1 patch onto the skin once a week. Qty: 12 patch, Refills: 3    meloxicam (MOBIC) 7.5 MG tablet Take 1 tablet (7.5 mg total) by mouth daily. Qty: 90 tablet, Refills: 0    omeprazole (PRILOSEC) 20 MG capsule Take 1 capsule (20 mg total) by mouth daily. Qty: 90 capsule, Refills: 3      STOP taking these medications     amLODipine-benazepril (LOTREL) 10-20 MG per capsule      Pseudoephedrine HCl (DECONGESTANT PO)      ALPRAZolam (XANAX) 0.5 MG tablet        Allergies  Allergen Reactions  . Ampicillin Shortness Of Breath, Swelling and Rash    REACTION: SOB, Rash, edema  . Hydromorphone Hcl Shortness Of Breath, Itching, Swelling and Rash  . Penicillins Anaphylaxis    Moderate anaphylaxis   Follow-up Information    Follow up with [facility], MD In 2 weeks.   Specialty:  Family Medicine   Contact information:   [address] [place] [address] [phone]       Follow up with Laverda Page, MD In 1 week.   Specialty:  Cardiology   Contact information:   [address] [place] [address] [phone]        The results of significant diagnostics from this  hospitalization (including imaging, microbiology, ancillary and laboratory) are listed below for reference.    Significant Diagnostic Studies: Dg Chest 2 View  11/27/2014   CLINICAL DATA:  Fatigue and weakness for 1 week. Shortness of breath and right shoulder pain.  EXAM: CHEST  2 VIEW  COMPARISON:  03/03/2009  FINDINGS: Normal heart size calcified atherosclerotic plaque involves the aortic arch. No pleural effusion or edema. No airspace consolidation. Asymmetric elevation of the right hemidiaphragm identified.  IMPRESSION: 1. No acute cardiopulmonary abnormalities.   Electronically Signed   By: Kerby Moors M.D.   On: 11/27/2014 17:10    Microbiology: Recent Results (from the past 240 hour(s))  Urine culture     Status: None   Collection Time: 11/28/14 11:53 AM  Result Value Ref Range Status   Specimen Description URINE, RANDOM  Final   Special Requests NONE  Final   Colony Count   Final    >=100,000 COLONIES/ML Performed at [facility]    Culture   Final    KLEBSIELLA PNEUMONIAE Performed at [facility]    Report Status 11/30/2014 FINAL  Final   Organism ID, Bacteria KLEBSIELLA PNEUMONIAE  Final      Susceptibility   Klebsiella pneumoniae - MIC*    AMPICILLIN RESISTANT      CEFAZOLIN <=4 SENSITIVE Sensitive     CEFTRIAXONE <=1 SENSITIVE Sensitive     CIPROFLOXACIN <=0.25 SENSITIVE Sensitive     GENTAMICIN <=1 SENSITIVE Sensitive     LEVOFLOXACIN <=0.12 SENSITIVE Sensitive     NITROFURANTOIN <=16 SENSITIVE Sensitive     TOBRAMYCIN <=1 SENSITIVE Sensitive     TRIMETH/SULFA <=20 SENSITIVE Sensitive     PIP/TAZO <=4 SENSITIVE Sensitive     * KLEBSIELLA PNEUMONIAE     Labs: Basic Metabolic Panel:  Recent Labs Lab 11/27/14 1557 11/28/14 0724 11/29/14 0316 11/30/14 0400  NA 136 140 136 136  K 3.3* 4.4 4.1 4.7  CL 100 102 102 101  CO2 25 29 26 30   GLUCOSE 113* 120* 130* 127*  BUN 19 17 19 14   CREATININE 1.41* 1.43* 1.27* 1.20*  CALCIUM 8.8 9.1  8.6 8.7  MG  --   --  2.0  --    Liver Function Tests:  Recent Labs Lab 11/27/14 1557 11/28/14 0724  AST 20 18  ALT 14 12  ALKPHOS 49 51  BILITOT 0.4 0.3  PROT 6.2 5.0*  ALBUMIN 3.1* 3.1*   No results for input(s): LIPASE, AMYLASE in the last 168 hours. No results for input(s): AMMONIA in the last 168 hours. CBC:  Recent Labs Lab 11/27/14 1557 11/28/14 0724  WBC 9.1 7.7  NEUTROABS 5.3  --   HGB 13.1 13.0  HCT 38.3 38.2  MCV 92.5 95.7  PLT 280 264   Cardiac Enzymes:  Recent Labs Lab 11/27/14 2005 11/28/14 0021 11/28/14 0724 11/28/14 1339  CKTOTAL  --   --   --  60  TROPONINI <0.03 <0.03 <0.03  --    BNP: BNP (last 3 results) No results for input(s): PROBNP in the last 8760 hours. CBG: No results for input(s): GLUCAP in the last 168 hours.     SignedKelvin Cellar  [facility] 12/02/2014, 1:40 PM

## 2014-12-02 NOTE — Progress Notes (Signed)
Subjective:  Feels better and c/o right shoulder pain (chronic) but worse. No dyspnea or CP or palpitations  Objective:  Vital Signs in the last 24 hours: Temp:  [97.8 F (36.6 C)-98.8 F (37.1 C)] 98.8 F (37.1 C) (01/05 0649) Pulse Rate:  [53-80] 64 (01/05 0649) Resp:  [16-18] 18 (01/05 0649) BP: (108-138)/(47-63) 131/51 mmHg (01/05 0649) SpO2:  [93 %-96 %] 95 % (01/05 0649) Weight:  [84.1 kg (185 lb 6.5 oz)-85.9 kg (189 lb 6 oz)] 85.9 kg (189 lb 6 oz) (01/05 0649)  Intake/Output from previous day: 01/04 0701 - 01/05 0700 In: 1560 [P.O.:1560] Out: 2900 [Urine:2900]  Physical Exam:   General appearance: Moderately build and mildly obese body habitus who is in no acute distress. Appears younger than stated age. Alert Ox3.  There is no cyanosis. HEENT: normal limits. No JVD.   CARDIAC EXAM: S1 variable, S2 normal, no gallop present. No murmur. Distant heart sounds  CHEST EXAM: No tenderness of chest wall. LUNGS: Clear to percuss and auscultate.  ABDOMEN: No hepatosplenomegaly. BS normal in all 4 quadrants. Abdomen is non-tender.   EXTREMITY: Full range of movementes, No edema. Large adipose tissue on the legs. MUSCULOSKELETAL EXAM: Intact with full range of motion in all 4 extremities.   NEUROLOGIC EXAM: Grossly intact without any focal deficits. Alert O x 3.   VASCULAR EXAM: No skin breakdown. Carotids normal. Extremities: Femoral pulse normal. Popliteal pulse normal ; Pedal pulse normal. Otherwise No prominent pulse felt in the abdomen. No varicose veins.  Lab Results: BMP  Recent Labs  11/28/14 0724 11/29/14 0316 11/30/14 0400  NA 140 136 136  K 4.4 4.1 4.7  CL 102 102 101  CO2 29 26 30   GLUCOSE 120* 130* 127*  BUN 17 19 14   CREATININE 1.43* 1.27* 1.20*  CALCIUM 9.1 8.6 8.7  GFRNONAA 34* 39* 42*  GFRAA 39* 45* 48*    CBC  Recent Labs Lab 11/27/14 1557 11/28/14 0724  WBC 9.1 7.7  RBC 4.14 3.99  HGB 13.1 13.0  HCT 38.3 38.2  PLT 280  264  MCV 92.5 95.7  MCH 31.6 32.6  MCHC 34.2 34.0  RDW 12.7 12.9  LYMPHSABS 2.3  --   MONOABS 1.2*  --   EOSABS 0.3  --   BASOSABS 0.1  --     HEMOGLOBIN A1C No results found for: HGBA1C, MPG  Cardiac Panel (last 3 results)  Recent Labs  11/27/14 2005 11/28/14 0021 11/28/14 0724 11/28/14 1339  CKTOTAL  --   --   --  28  TROPONINI <0.03 <0.03 <0.03  --     BNP (last 3 results) No results for input(s): PROBNP in the last 8760 hours.  TSH  Recent Labs  11/27/14 2005  TSH 3.708    CHOLESTEROL No results for input(s): CHOL in the last 8760 hours.  Hepatic Function Panel  Recent Labs  11/27/14 1557 11/28/14 0724  PROT 6.2 5.0*  ALBUMIN 3.1* 3.1*  AST 20 18  ALT 14 12  ALKPHOS 49 51  BILITOT 0.4 0.3    Imaging: Imaging results have been reviewed  Cardiac Studies:  Tele: 12/02/2013: SR with 1st degree AV Block. Wide complex.  EKG: 11/27/2014: Atrial fibrillation with rapid ventricle response, left bundle branch block. No further analysis due to LBBB.  Echocardiogram 11/30/2013: Left ventricle: The cavity size was normal. There was mild concentric hypertrophy. Systolic function was normal. The estimated ejection fraction was in the range of 55% to 60%. The study is  not technically sufficient to allow evaluation of LV diastolic function.  - Ventricular septum: Septal motion showed abnormal function. These changes are consistent with a left bundle branch block. - Aortic valve: There was mild regurgitation. - Mitral valve: Normal-sized, calcified annulus. Mildly calcifiedleaflets . There was moderate to severe regurgitation. - Left atrium: The atrium was moderately to severely dilated. - Right ventricle: The cavity size was mildly dilated. - Right atrium: The atrium was moderately to severely dilated. Atrial septum: No defect or patent foramen ovale was identified. - Tricuspid valve: Mildly dilated annulus. There was moderateregurgitation. Pulmonary  arteries: PA peak pressure: 53 mm Hg(S).  Compared to 03/20/2013 of his echocardiogram, mitral regurgitation is new, tricuspid regurgitation worse, pulmonary hypertension worse. Otherwise no significant change.  Assessment/Plan:  1. Atrial fibrillation with rapid ventricular response, suspect onset to be during November 2014. No prior history of atrial fibrillation. Today back in sinus rhythm. CHA2DS2-VASCScore: Risk Score 5, Yearly risk of stroke 6.7. Recommendation: ASA No/Anticoagulation Yes  [place] Has Bled: Score. Estimated risk of major bleeding at 1 year with [place] 1.88 to 3.2%  2. Coronary artery disease of native vessel, history of stenting to the LAD in 2003, mild disease of the left coronary arteries. Outpatient tests: Lexiscan stress 03/22/13: 1. Resting EKG NSR, LBBB. Stress EKG was non diagnostic for ischemia. 2. Perfusion imaging study demonstrated mild soft tissue attenuation consistent with breast attenuation. Inferior wall scar cannot be completely excluded although less likely with mild improvement in perfusion with stress images. The left ventricular systolic function was severely depressed at 32% by QGS, however visually appears normal. This is a low risk study. Clinical correlation recommended. Compared to a nuclear stress study that was performed in 2008 report, there was anterior breast attenuation artifact. Ejection fraction previously was normal.  3. Chronic fatigue, suspect that she probably has significant sleep apnea, markedly abnormal oximetry, presently on home oxygen at night. may need formal sleep study. 4. Hyperlipidemia 5. Hypertension 6. Chronic kidney disease, stage III 7. Hyperglycemia  Recommendation:  Tolerating all her medications well and BP and HR controlled. Today back in sinus rhythm. Stable from cardiac standpoint. C/O shoulder pain right, suspect DJD. Call if questions.    Laverda Page, M.D. 12/02/2014, 12:56 PM [facility]  [place] Pager: [phone] Office: [phone] If no answer: [phone]

## 2014-12-05 DIAGNOSIS — N183 Chronic kidney disease, stage 3 (moderate): Secondary | ICD-10-CM | POA: Diagnosis not present

## 2014-12-05 DIAGNOSIS — B961 Klebsiella pneumoniae [K. pneumoniae] as the cause of diseases classified elsewhere: Secondary | ICD-10-CM | POA: Diagnosis not present

## 2014-12-05 DIAGNOSIS — I480 Paroxysmal atrial fibrillation: Secondary | ICD-10-CM | POA: Diagnosis not present

## 2014-12-05 DIAGNOSIS — N390 Urinary tract infection, site not specified: Secondary | ICD-10-CM | POA: Diagnosis not present

## 2014-12-05 DIAGNOSIS — I129 Hypertensive chronic kidney disease with stage 1 through stage 4 chronic kidney disease, or unspecified chronic kidney disease: Secondary | ICD-10-CM | POA: Diagnosis not present

## 2014-12-05 DIAGNOSIS — I2510 Atherosclerotic heart disease of native coronary artery without angina pectoris: Secondary | ICD-10-CM | POA: Diagnosis not present

## 2014-12-05 DIAGNOSIS — F419 Anxiety disorder, unspecified: Secondary | ICD-10-CM | POA: Diagnosis not present

## 2014-12-11 DIAGNOSIS — I2510 Atherosclerotic heart disease of native coronary artery without angina pectoris: Secondary | ICD-10-CM | POA: Diagnosis not present

## 2014-12-11 DIAGNOSIS — I480 Paroxysmal atrial fibrillation: Secondary | ICD-10-CM | POA: Diagnosis not present

## 2014-12-11 DIAGNOSIS — G4734 Idiopathic sleep related nonobstructive alveolar hypoventilation: Secondary | ICD-10-CM | POA: Diagnosis not present

## 2014-12-11 DIAGNOSIS — Z9861 Coronary angioplasty status: Secondary | ICD-10-CM | POA: Diagnosis not present

## 2014-12-15 DIAGNOSIS — N183 Chronic kidney disease, stage 3 (moderate): Secondary | ICD-10-CM | POA: Diagnosis not present

## 2014-12-15 DIAGNOSIS — I129 Hypertensive chronic kidney disease with stage 1 through stage 4 chronic kidney disease, or unspecified chronic kidney disease: Secondary | ICD-10-CM | POA: Diagnosis not present

## 2014-12-15 DIAGNOSIS — I480 Paroxysmal atrial fibrillation: Secondary | ICD-10-CM | POA: Diagnosis not present

## 2014-12-15 DIAGNOSIS — I2510 Atherosclerotic heart disease of native coronary artery without angina pectoris: Secondary | ICD-10-CM | POA: Diagnosis not present

## 2014-12-15 DIAGNOSIS — N390 Urinary tract infection, site not specified: Secondary | ICD-10-CM | POA: Diagnosis not present

## 2014-12-15 DIAGNOSIS — B961 Klebsiella pneumoniae [K. pneumoniae] as the cause of diseases classified elsewhere: Secondary | ICD-10-CM | POA: Diagnosis not present

## 2014-12-18 DIAGNOSIS — I480 Paroxysmal atrial fibrillation: Secondary | ICD-10-CM | POA: Diagnosis not present

## 2014-12-18 DIAGNOSIS — I2510 Atherosclerotic heart disease of native coronary artery without angina pectoris: Secondary | ICD-10-CM | POA: Diagnosis not present

## 2014-12-18 DIAGNOSIS — N390 Urinary tract infection, site not specified: Secondary | ICD-10-CM | POA: Diagnosis not present

## 2014-12-18 DIAGNOSIS — N183 Chronic kidney disease, stage 3 (moderate): Secondary | ICD-10-CM | POA: Diagnosis not present

## 2014-12-18 DIAGNOSIS — I129 Hypertensive chronic kidney disease with stage 1 through stage 4 chronic kidney disease, or unspecified chronic kidney disease: Secondary | ICD-10-CM | POA: Diagnosis not present

## 2014-12-18 DIAGNOSIS — B961 Klebsiella pneumoniae [K. pneumoniae] as the cause of diseases classified elsewhere: Secondary | ICD-10-CM | POA: Diagnosis not present

## 2015-01-14 DIAGNOSIS — R5381 Other malaise: Secondary | ICD-10-CM | POA: Diagnosis not present

## 2015-01-14 DIAGNOSIS — I480 Paroxysmal atrial fibrillation: Secondary | ICD-10-CM | POA: Diagnosis not present

## 2015-01-14 DIAGNOSIS — I2510 Atherosclerotic heart disease of native coronary artery without angina pectoris: Secondary | ICD-10-CM | POA: Diagnosis not present

## 2015-01-14 DIAGNOSIS — Z9861 Coronary angioplasty status: Secondary | ICD-10-CM | POA: Diagnosis not present

## 2015-02-03 DIAGNOSIS — I509 Heart failure, unspecified: Secondary | ICD-10-CM | POA: Diagnosis not present

## 2015-02-03 DIAGNOSIS — E785 Hyperlipidemia, unspecified: Secondary | ICD-10-CM | POA: Diagnosis not present

## 2015-02-03 DIAGNOSIS — I4891 Unspecified atrial fibrillation: Secondary | ICD-10-CM | POA: Diagnosis not present

## 2015-02-03 DIAGNOSIS — R5383 Other fatigue: Secondary | ICD-10-CM | POA: Diagnosis not present

## 2015-02-03 DIAGNOSIS — I10 Essential (primary) hypertension: Secondary | ICD-10-CM | POA: Diagnosis not present

## 2015-04-21 DIAGNOSIS — I480 Paroxysmal atrial fibrillation: Secondary | ICD-10-CM | POA: Diagnosis not present

## 2015-04-21 DIAGNOSIS — R5381 Other malaise: Secondary | ICD-10-CM | POA: Diagnosis not present

## 2015-04-21 DIAGNOSIS — I10 Essential (primary) hypertension: Secondary | ICD-10-CM | POA: Diagnosis not present

## 2015-04-21 DIAGNOSIS — E780 Pure hypercholesterolemia: Secondary | ICD-10-CM | POA: Diagnosis not present

## 2015-10-26 DIAGNOSIS — K625 Hemorrhage of anus and rectum: Secondary | ICD-10-CM | POA: Diagnosis not present

## 2015-11-17 DIAGNOSIS — K621 Rectal polyp: Secondary | ICD-10-CM | POA: Diagnosis not present

## 2015-11-17 DIAGNOSIS — K625 Hemorrhage of anus and rectum: Secondary | ICD-10-CM | POA: Diagnosis not present

## 2015-11-17 DIAGNOSIS — D125 Benign neoplasm of sigmoid colon: Secondary | ICD-10-CM | POA: Diagnosis not present

## 2015-11-17 DIAGNOSIS — K5730 Diverticulosis of large intestine without perforation or abscess without bleeding: Secondary | ICD-10-CM | POA: Diagnosis not present

## 2015-11-17 DIAGNOSIS — K641 Second degree hemorrhoids: Secondary | ICD-10-CM | POA: Diagnosis not present

## 2015-11-17 DIAGNOSIS — K921 Melena: Secondary | ICD-10-CM | POA: Diagnosis not present

## 2015-11-17 DIAGNOSIS — K648 Other hemorrhoids: Secondary | ICD-10-CM | POA: Diagnosis not present

## 2015-11-17 DIAGNOSIS — D128 Benign neoplasm of rectum: Secondary | ICD-10-CM | POA: Diagnosis not present

## 2015-12-30 DIAGNOSIS — I480 Paroxysmal atrial fibrillation: Secondary | ICD-10-CM | POA: Diagnosis not present

## 2015-12-30 DIAGNOSIS — Z9861 Coronary angioplasty status: Secondary | ICD-10-CM | POA: Diagnosis not present

## 2015-12-30 DIAGNOSIS — I10 Essential (primary) hypertension: Secondary | ICD-10-CM | POA: Diagnosis not present

## 2015-12-30 DIAGNOSIS — I2510 Atherosclerotic heart disease of native coronary artery without angina pectoris: Secondary | ICD-10-CM | POA: Diagnosis not present

## 2015-12-31 DIAGNOSIS — E7800 Pure hypercholesterolemia, unspecified: Secondary | ICD-10-CM | POA: Diagnosis not present

## 2015-12-31 DIAGNOSIS — R5381 Other malaise: Secondary | ICD-10-CM | POA: Diagnosis not present

## 2015-12-31 DIAGNOSIS — I10 Essential (primary) hypertension: Secondary | ICD-10-CM | POA: Diagnosis not present

## 2016-01-14 ENCOUNTER — Institutional Professional Consult (permissible substitution): Payer: Medicare Other | Admitting: Neurology

## 2016-01-20 ENCOUNTER — Encounter: Payer: Self-pay | Admitting: Neurology

## 2016-01-20 ENCOUNTER — Ambulatory Visit (INDEPENDENT_AMBULATORY_CARE_PROVIDER_SITE_OTHER): Payer: Medicare Other | Admitting: Neurology

## 2016-01-20 VITALS — BP 128/60 | HR 58 | Resp 18 | Ht 64.75 in | Wt 198.0 lb

## 2016-01-20 DIAGNOSIS — R0683 Snoring: Secondary | ICD-10-CM | POA: Diagnosis not present

## 2016-01-20 DIAGNOSIS — M7989 Other specified soft tissue disorders: Secondary | ICD-10-CM | POA: Diagnosis not present

## 2016-01-20 DIAGNOSIS — R51 Headache: Secondary | ICD-10-CM | POA: Diagnosis not present

## 2016-01-20 DIAGNOSIS — R0681 Apnea, not elsewhere classified: Secondary | ICD-10-CM

## 2016-01-20 DIAGNOSIS — G4719 Other hypersomnia: Secondary | ICD-10-CM

## 2016-01-20 DIAGNOSIS — E669 Obesity, unspecified: Secondary | ICD-10-CM

## 2016-01-20 DIAGNOSIS — R351 Nocturia: Secondary | ICD-10-CM | POA: Diagnosis not present

## 2016-01-20 DIAGNOSIS — I480 Paroxysmal atrial fibrillation: Secondary | ICD-10-CM | POA: Diagnosis not present

## 2016-01-20 DIAGNOSIS — R0989 Other specified symptoms and signs involving the circulatory and respiratory systems: Secondary | ICD-10-CM | POA: Diagnosis not present

## 2016-01-20 DIAGNOSIS — R519 Headache, unspecified: Secondary | ICD-10-CM

## 2016-01-20 NOTE — Progress Notes (Signed)
Subjective:    Patient ID: [name] is a 82 y.o. female.  HPI     [name], MD, [name] [name] [address], [address] [place], [place] [address]  Dear [name],   I saw your patient, [name], upon your kind request in my neurologic clinic today for initial consultation of her sleep disturbance, in particular, concern for underlying obstructive sleep apnea in the context of obesity, recent diagnosis of paroxysmal A. fib, and a prior abnormal overnight pulse oximetry test. The patient is unaccompanied today. Of note, the patient canceled an appointment for 01/14/2016. As you know, [name] is a very pleasant 82 year old right-handed woman with an underlying medical history of hypertension, hyperlipidemia, coronary artery disease status post stent placement, reflux disease, paroxysmal A. fib, status post cardioversion, depression, anxiety and obesity, who reports snoring, and her daughter in [place] has told her she has breathing pauses while asleep. She had an episode of Afib in 12/14.  She lives alone, she was married for the 1st time for 7 years, had 2 daughters, then married a 2nd time for 59 years, had one son, separated in 58, divorced in 89. She had 5 grandchildren, sadly lost 2 grandsons. She worked in [employer]. She is a non-smoker, does not drink alcohol, but states, she is "addicted" to diet Pepsi, about 32 oz today. She goes to bed around 11 to 11:30 PM but wakes up in the early morning hours between 1:30 and 3 AM and has trouble going back to sleep, sometimes till 5 AM. She finally gets out of bed by 7:30 to 8 AM. She has had morning headaches, these are dull, bitemporal, bifrontal and on top, different from migraines. She used to have migraines in the past. She has nocturia, at least once per night. She lays down in the afternoon to rest, does not always fall asleep. She denies restless leg symptoms. She denies any issues driving. Her  Epworth sleepiness score is 4 out of 24, her fatigue score is 15 out of 63. She has been using oxygen at night but the past year at least. This was started after she had an abnormal pulse oximetry test through your office. I reviewed your office note from 04/21/2015, which you kindly included.  Her Past Medical History Is Significant For: Past Medical History  Diagnosis Date  . CHF (congestive heart failure) ([place])   . LBBB (left bundle branch block)   . A-fib ([place]) 11/27/2014  . Bradycardia   . Heart disease   . Hyperlipidemia   . Hypertension   . Fatigue   . CAD (coronary artery disease) 2003    LAD stent. Mild disease left  . Family history of adverse reaction to anesthesia     "sister had problem waking up; don't think it was anything serious; she slept for a day"  . On home oxygen therapy     "only at night; ? setting" (11/27/2014)  . GERD (gastroesophageal reflux disease)   . History of hiatal hernia   . Migraine     "very rare now" (11/27/2014)  . Headache     "in the top of my head; have them ~ 2 times/wk" (11/27/2014)  . Osteoarthritis cervical spine   . Anxiety     "some; not treated for it" (11/27/2014)  . Kidney failure     "hx; did not have to be treated; it got better" (11/27/2014)    Her Past Surgical History Is Significant For: Past  Surgical History  Procedure Laterality Date  . Abdominal hysterectomy    . Cystocele repair    . Cataract extraction w/ intraocular lens  implant, bilateral Bilateral     Her Family History Is Significant For: Family History  Problem Relation Age of Onset  . Heart attack Mother   . Hypertension Mother   . Heart Problems Mother   . Heart attack Father   . Hypertension Father     Her Social History Is Significant For: Social History   Social History  . Marital Status: Divorced    Spouse Name: N/A  . Number of Children: 3  . Years of Education: college   Occupational History  . Retired     Social History Main  Topics  . Smoking status: Never Smoker   . Smokeless tobacco: Never Used  . Alcohol Use: No  . Drug Use: No  . Sexual Activity: No   Other Topics Concern  . None   Social History Narrative   Drinks tea, occasional coffee    Her Allergies Are:  Allergies  Allergen Reactions  . Ampicillin Shortness Of Breath, Swelling and Rash    REACTION: SOB, Rash, edema  . Hydromorphone Hcl Shortness Of Breath, Itching, Swelling and Rash  . Penicillins Anaphylaxis    Moderate anaphylaxis  :   Her Current Medications Are:  Outpatient Encounter Prescriptions as of 01/20/2016  Medication Sig  . acetaminophen (TYLENOL) 325 MG tablet Take 2 tablets (650 mg total) by mouth every 6 (six) hours as needed for mild pain (or Fever >/= 101).  Marland Kitchen apixaban (ELIQUIS) 5 MG TABS tablet Take 1 tablet (5 mg total) by mouth 2 (two) times daily.  Marland Kitchen atenolol (TENORMIN) 50 MG tablet Take 50 mg by mouth daily.  Marland Kitchen atorvastatin (LIPITOR) 80 MG tablet Take 80 mg by mouth daily.  . citalopram (CELEXA) 20 MG tablet Take 1 tablet (20 mg total) by mouth daily.  Marland Kitchen diltiazem (CARDIZEM CD) 240 MG 24 hr capsule Take 1 capsule (240 mg total) by mouth daily.  Marland Kitchen estradiol (CLIMARA - DOSED IN MG/24 HR) 0.0375 mg/24hr Place 1 patch (0.0375 mg total) onto the skin once a week.  Marland Kitchen omeprazole (PRILOSEC) 20 MG capsule Take 1 capsule (20 mg total) by mouth daily.  . [DISCONTINUED] atenolol (TENORMIN) 25 MG tablet Take 1 tablet (25 mg total) by mouth daily.  . [DISCONTINUED] estradiol (VIVELLE-DOT) 0.0375 MG/24HR Place 1 patch onto the skin once a week.  . [DISCONTINUED] HYDROcodone-acetaminophen (NORCO/VICODIN) 5-325 MG per tablet Take 1 tablet by mouth every 6 (six) hours as needed for moderate pain.  . [DISCONTINUED] meloxicam (MOBIC) 7.5 MG tablet Take 1 tablet (7.5 mg total) by mouth daily.  . [DISCONTINUED] simvastatin (ZOCOR) 80 MG tablet Take 1 tablet (80 mg total) by mouth every evening.   No facility-administered encounter  medications on file as of 01/20/2016.  :  Review of Systems:  Out of a complete 14 point review of systems, all are reviewed and negative with the exception of these symptoms as listed below:   Review of Systems  Constitutional: Positive for fatigue.  Respiratory: Positive for cough and shortness of breath.   Cardiovascular: Positive for leg swelling.  Allergic/Immunologic: Positive for environmental allergies.  Neurological:       Patient reports trouble falling and staying asleep, snoring, witnessed apnea, wakes up feeling tired, daytime tiredness, headaches, sometimes takes a nap during day.    Epworth Sleepiness Scale 0= would never doze 1= slight chance of dozing  2= moderate chance of dozing 3= high chance of dozing  Sitting and reading: 0 Watching TV:0 Sitting inactive in a public place (ex. Theater or meeting):0 As a passenger in a car for an hour without a break:0 Lying down to rest in the afternoon:3 Sitting and talking to someone:1 Sitting quietly after lunch (no alcohol):0 In a car, while stopped in traffic:0 Total:4  Objective:  Neurologic Exam  Physical Exam Physical Examination:   Filed Vitals:   01/20/16 1109  BP: 128/60  Pulse: 58  Resp: 18    General Examination: The patient is a very pleasant 82 y.o. female in no acute distress. She appears well-developed and well-nourished and well groomed.   HEENT: Normocephalic, atraumatic, pupils are equal, round and reactive to light and accommodation. Funduscopic exam is normal with sharp disc margins noted. She is s/p b/l cataract repairs. forgot her glasses. Extraocular tracking is good without limitation to gaze excursion or nystagmus noted. Normal smooth pursuit is noted. Hearing is impaired (hearing aids are broken she says). intact. Face is symmetric with normal facial animation and normal facial sensation. Speech is clear with no dysarthria noted. There is no hypophonia. There is no lip, neck/head, jaw or  voice tremor. Neck is supple with full range of passive and active motion. There are no carotid bruits on auscultation. Oropharynx exam reveals: mild mouth dryness, adequate dental hygiene and moderate airway crowding, due to somewhat smaller airway entry, uvula is small, tonsils absent, and tongue appears to be slightly larger. Mallampati is class II. Tongue protrudes centrally and palate elevates symmetrically. Tonsils are absent. Neck size is 15 3/8 inches. She has a Mild overbite and a broken L upper insisor.   Chest: Clear to auscultation without wheezing, rhonchi or crackles noted.  Heart: S1+S2+0, regular and normal without murmurs, rubs or gallops noted.   Abdomen: Soft, non-tender and non-distended with normal bowel sounds appreciated on auscultation.  Extremities: There is 1-2+ mildly pitting edema in the distal lower extremities bilaterally.   Skin: Warm and dry without trophic changes noted. There are no varicose veins.  Musculoskeletal: exam reveals no obvious joint deformities, tenderness or joint swelling or erythema.   Neurologically:  Mental status: The patient is awake, alert and oriented in all 4 spheres. Her immediate and remote memory, attention, language skills and fund of knowledge are appropriate. There is no evidence of aphasia, agnosia, apraxia or anomia. Speech is clear with normal prosody and enunciation. Thought process is linear. Mood is normal and affect is normal.  Cranial nerves II - XII are as described above under HEENT exam. In addition: shoulder shrug is normal with equal shoulder height noted. Motor exam: Normal bulk, strength and tone is noted. There is no drift, tremor or rebound. Romberg is negative. Reflexes are 1+ throughout. Fine motor skills and coordination: intact in the UEs and LEs. Cerebellar testing: No dysmetria or intention tremor on finger to nose testing. Heel to shin is unremarkable bilaterally. There is no truncal or gait ataxia.  Sensory  exam: intact in the UEs and LEs.   Gait, station and balance: She stands with mild difficulty. No veering to one side is noted. No leaning to one side is noted. Posture is age-appropriate and stance is narrow based. Gait shows normal stride length and normal pace. No problems turning are noted.      Assessment and Plan:  In summary, [name] is a very pleasant 82 y.o.-year old female with an underlying medical history of hypertension, hyperlipidemia,  coronary artery disease status post stent placement, reflux disease, paroxysmal A. fib, status post cardioversion, depression, anxiety and obesity, whose history and physical exam are concerning, rather in keeping with obstructive sleep apnea (OSA). I had a long chat with the patient about my findings and the diagnosis of OSA, its prognosis and treatment options. We talked about medical treatments, surgical interventions and non-pharmacological approaches. I explained in particular the risks and ramifications of untreated moderate to severe OSA, especially with respect to developing cardiovascular disease down the Road, including congestive heart failure, difficult to treat hypertension, cardiac arrhythmias, or stroke. Even type 2 diabetes has, in part, been linked to untreated OSA. Symptoms of untreated OSA include daytime sleepiness, memory problems, mood irritability and mood disorder such as depression and anxiety, lack of energy, as well as recurrent headaches, especially morning headaches. We talked about trying to maintain a healthy lifestyle in general, as well as the importance of weight control. I encouraged the patient to eat healthy, exercise daily and keep well hydrated, to keep a scheduled bedtime and wake time routine, to not skip any meals and eat healthy snacks in between meals. I advised the patient not to drive when feeling sleepy. She is advised to lower caffeine intake. This may be in part the cause of her sleep maintenance issues. I  recommended the following at this time: sleep study with potential positive airway pressure titration. (We will score hypopneas at 4% and split the sleep study into diagnostic and treatment portion, if the estimated. 2 hour AHI is >15/h).   I explained the sleep test procedure to the patient and also outlined possible surgical and non-surgical treatment options of OSA, including the use of a custom-made dental device (which would require a referral to a specialist dentist or oral surgeon), upper airway surgical options, such as pillar implants, radiofrequency surgery, tongue base surgery, and UPPP (which would involve a referral to an ENT surgeon). Rarely, jaw surgery such as mandibular advancement may be considered.  I also explained the CPAP treatment option to the patient, who indicated that she would be willing to try CPAP if the need arises. I explained the importance of being compliant with PAP treatment, not only for insurance purposes but primarily to improve Her symptoms, and for the patient's long term health benefit, including to reduce Her cardiovascular risks. I answered all her questions today and the patient was in agreement. I would like to see her back after the sleep study is completed and encouraged her to call with any interim questions, concerns, problems or updates.   Thank you very much for allowing me to participate in the care of this nice patient. If I can be of any further assistance to you please do not hesitate to call me at [phone].  Sincerely,   [name], MD, [name]

## 2016-01-20 NOTE — Patient Instructions (Signed)

## 2016-02-04 ENCOUNTER — Ambulatory Visit (INDEPENDENT_AMBULATORY_CARE_PROVIDER_SITE_OTHER): Payer: Medicare Other | Admitting: Neurology

## 2016-02-04 DIAGNOSIS — Z9861 Coronary angioplasty status: Secondary | ICD-10-CM | POA: Diagnosis not present

## 2016-02-04 DIAGNOSIS — R9431 Abnormal electrocardiogram [ECG] [EKG]: Secondary | ICD-10-CM

## 2016-02-04 DIAGNOSIS — R350 Frequency of micturition: Secondary | ICD-10-CM | POA: Diagnosis not present

## 2016-02-04 DIAGNOSIS — G4761 Periodic limb movement disorder: Secondary | ICD-10-CM

## 2016-02-04 DIAGNOSIS — I2510 Atherosclerotic heart disease of native coronary artery without angina pectoris: Secondary | ICD-10-CM | POA: Diagnosis not present

## 2016-02-04 DIAGNOSIS — G4733 Obstructive sleep apnea (adult) (pediatric): Secondary | ICD-10-CM | POA: Diagnosis not present

## 2016-02-04 DIAGNOSIS — I480 Paroxysmal atrial fibrillation: Secondary | ICD-10-CM | POA: Diagnosis not present

## 2016-02-04 DIAGNOSIS — G4720 Circadian rhythm sleep disorder, unspecified type: Secondary | ICD-10-CM

## 2016-02-04 DIAGNOSIS — G4734 Idiopathic sleep related nonobstructive alveolar hypoventilation: Secondary | ICD-10-CM

## 2016-02-04 DIAGNOSIS — I10 Essential (primary) hypertension: Secondary | ICD-10-CM | POA: Diagnosis not present

## 2016-02-04 NOTE — Sleep Study (Signed)
Please see the scanned sleep study interpretation located in the procedure tab in the chart view section.  

## 2016-02-05 ENCOUNTER — Telehealth: Payer: Self-pay | Admitting: Neurology

## 2016-02-05 DIAGNOSIS — G4761 Periodic limb movement disorder: Secondary | ICD-10-CM

## 2016-02-05 DIAGNOSIS — G4734 Idiopathic sleep related nonobstructive alveolar hypoventilation: Secondary | ICD-10-CM

## 2016-02-05 DIAGNOSIS — G4733 Obstructive sleep apnea (adult) (pediatric): Secondary | ICD-10-CM

## 2016-02-05 DIAGNOSIS — G4720 Circadian rhythm sleep disorder, unspecified type: Secondary | ICD-10-CM

## 2016-02-05 NOTE — Telephone Encounter (Signed)
Patient referred by Dr. Einar Gip, seen by me on 01/20/16, diagnostic PSG on 02/04/16, ins: MCR.   Please call and notify the patient that the recent sleep study did confirm the diagnosis of obstructive sleep apnea with significant drops in her oxygen level, and that I recommend treatment for this in the form of CPAP, especially, in light of her cardiac history (parox. Afib and CAD with stent placement). This will require a repeat sleep study for proper titration and mask fitting. Please explain to patient and arrange for a CPAP titration study. I have placed an order in the chart. Thanks, and please route to [facility] for scheduling next sleep study.  [name], MD, [name] [facility])

## 2016-02-09 NOTE — Telephone Encounter (Signed)
LM for patient to call back for results

## 2016-02-09 NOTE — Telephone Encounter (Signed)
I spoke to patient and she is aware of results and recommendations. She is willing to proceed with titration study.  

## 2016-02-09 NOTE — Telephone Encounter (Signed)
Pt returned Diana's call °

## 2016-04-04 NOTE — Progress Notes (Signed)
HPI: 82 year old female for evaluation of atrial fibrillation.  Note there is an electrocardiogram from December 2015 showing atrial fibrillation. Patient had PCI of LAD in 2003. Nuclear study April 2014 showed ejection fraction 32% but visually appeared normal. There was breast attenuation. Inferior scar cannot be excluded. Echocardiogram January 2016 showed normal LV function. There was mild aortic insufficiency, moderate to severe mitral regurgitation, moderate to severe left atrial enlargement and right atrial enlargement, mild right ventricular enlargement and moderately elevated pulmonary pressures.  Current Outpatient Prescriptions  Medication Sig Dispense Refill  . acetaminophen (TYLENOL) 325 MG tablet Take 2 tablets (650 mg total) by mouth every 6 (six) hours as needed for mild pain (or Fever >/= 101). 30 tablet 1  . apixaban (ELIQUIS) 5 MG TABS tablet Take 1 tablet (5 mg total) by mouth 2 (two) times daily. 60 tablet 1  . atenolol (TENORMIN) 50 MG tablet Take 50 mg by mouth daily.    Marland Kitchen atorvastatin (LIPITOR) 80 MG tablet Take 80 mg by mouth daily.    . citalopram (CELEXA) 20 MG tablet Take 1 tablet (20 mg total) by mouth daily. 90 tablet 0  . diltiazem (CARDIZEM CD) 240 MG 24 hr capsule Take 1 capsule (240 mg total) by mouth daily. 30 capsule 1  . estradiol (CLIMARA - DOSED IN MG/24 HR) 0.0375 mg/24hr Place 1 patch (0.0375 mg total) onto the skin once a week. 12 patch 0  . omeprazole (PRILOSEC) 20 MG capsule Take 1 capsule (20 mg total) by mouth daily. 90 capsule 3   No current facility-administered medications for this visit.    Allergies  Allergen Reactions  . Ampicillin Shortness Of Breath, Swelling and Rash    REACTION: SOB, Rash, edema  . Hydromorphone Hcl Shortness Of Breath, Itching, Swelling and Rash  . Penicillins Anaphylaxis    Moderate anaphylaxis    Past Medical History  Diagnosis Date  . CHF (congestive heart failure) ([place])   . LBBB (left bundle branch  block)   . A-fib ([place]) 11/27/2014  . Bradycardia   . Heart disease   . Hyperlipidemia   . Hypertension   . Fatigue   . CAD (coronary artery disease) 2003    LAD stent. Mild disease left  . Family history of adverse reaction to anesthesia     "sister had problem waking up; don't think it was anything serious; she slept for a day"  . On home oxygen therapy     "only at night; ? setting" (11/27/2014)  . GERD (gastroesophageal reflux disease)   . History of hiatal hernia   . Migraine     "very rare now" (11/27/2014)  . Headache     "in the top of my head; have them ~ 2 times/wk" (11/27/2014)  . Osteoarthritis cervical spine   . Anxiety     "some; not treated for it" (11/27/2014)  . Kidney failure     "hx; did not have to be treated; it got better" (11/27/2014)    Past Surgical History  Procedure Laterality Date  . Abdominal hysterectomy    . Cystocele repair    . Cataract extraction w/ intraocular lens  implant, bilateral Bilateral     Social History   Social History  . Marital Status: Divorced    Spouse Name: N/A  . Number of Children: 3  . Years of Education: college   Occupational History  . Retired     Social History Main Topics  . Smoking status: Never Smoker   .  Smokeless tobacco: Never Used  . Alcohol Use: No  . Drug Use: No  . Sexual Activity: No   Other Topics Concern  . Not on file   Social History Narrative   Drinks tea, occasional coffee    Family History  Problem Relation Age of Onset  . Heart attack Mother   . Hypertension Mother   . Heart Problems Mother   . Heart attack Father   . Hypertension Father     ROS: no fevers or chills, productive cough, hemoptysis, dysphasia, odynophagia, melena, hematochezia, dysuria, hematuria, rash, seizure activity, orthopnea, PND, pedal edema, claudication. Remaining systems are negative.  Physical Exam:   There were no vitals taken for this visit.  General:  Well developed/well nourished in  NAD Skin warm/dry Patient not depressed No peripheral clubbing Back-normal HEENT-normal/normal eyelids Neck supple/normal carotid upstroke bilaterally; no bruits; no JVD; no thyromegaly chest - CTA/ normal expansion CV - RRR/normal S1 and S2; no murmurs, rubs or gallops;  PMI nondisplaced Abdomen -NT/ND, no HSM, no mass, + bowel sounds, no bruit 2+ femoral pulses, no bruits Ext-no edema, chords, 2+ DP Neuro-grossly nonfocal  ECG 11/27/2014-atrial fibrillation, left bundle branch block.   This encounter was created in error - please disregard.

## 2016-04-08 ENCOUNTER — Telehealth: Payer: Self-pay | Admitting: Cardiology

## 2016-04-08 NOTE — Telephone Encounter (Signed)
04/07/16 Patient contacted office regarding records from Dr Adrian Prows.  She had asked that they fax [place] records.  Have not received.  Sent fax to Dr Einar Gip office asking for records for appointment on 04/11/16.

## 2016-04-11 ENCOUNTER — Encounter: Payer: Medicare Other | Admitting: Cardiology

## 2016-04-11 ENCOUNTER — Telehealth: Payer: Self-pay | Admitting: Cardiology

## 2016-04-11 NOTE — Telephone Encounter (Signed)
Received records from [facility] for appointment on 06/06/16 with Dr Stanford Breed.  Records given to [facility] (medical records) for Dr Jacalyn Lefevre schedule on 06/06/16. lp

## 2016-04-14 DIAGNOSIS — R51 Headache: Secondary | ICD-10-CM | POA: Diagnosis not present

## 2016-04-15 DIAGNOSIS — R51 Headache: Secondary | ICD-10-CM | POA: Diagnosis not present

## 2016-04-15 DIAGNOSIS — Z79899 Other long term (current) drug therapy: Secondary | ICD-10-CM | POA: Diagnosis not present

## 2016-04-19 ENCOUNTER — Encounter: Payer: Self-pay | Admitting: Cardiology

## 2016-04-19 DIAGNOSIS — I5032 Chronic diastolic (congestive) heart failure: Secondary | ICD-10-CM | POA: Diagnosis not present

## 2016-04-19 DIAGNOSIS — E668 Other obesity: Secondary | ICD-10-CM | POA: Diagnosis not present

## 2016-04-19 DIAGNOSIS — E785 Hyperlipidemia, unspecified: Secondary | ICD-10-CM | POA: Diagnosis not present

## 2016-04-19 DIAGNOSIS — R0602 Shortness of breath: Secondary | ICD-10-CM | POA: Diagnosis not present

## 2016-04-19 DIAGNOSIS — Z7901 Long term (current) use of anticoagulants: Secondary | ICD-10-CM | POA: Diagnosis not present

## 2016-04-19 DIAGNOSIS — I2510 Atherosclerotic heart disease of native coronary artery without angina pectoris: Secondary | ICD-10-CM | POA: Diagnosis not present

## 2016-04-19 DIAGNOSIS — I480 Paroxysmal atrial fibrillation: Secondary | ICD-10-CM | POA: Diagnosis not present

## 2016-04-19 DIAGNOSIS — I447 Left bundle-branch block, unspecified: Secondary | ICD-10-CM | POA: Diagnosis not present

## 2016-04-19 DIAGNOSIS — N183 Chronic kidney disease, stage 3 (moderate): Secondary | ICD-10-CM | POA: Diagnosis not present

## 2016-04-19 DIAGNOSIS — I119 Hypertensive heart disease without heart failure: Secondary | ICD-10-CM | POA: Diagnosis not present

## 2016-04-27 DIAGNOSIS — R51 Headache: Secondary | ICD-10-CM | POA: Diagnosis not present

## 2016-04-27 DIAGNOSIS — E782 Mixed hyperlipidemia: Secondary | ICD-10-CM | POA: Diagnosis not present

## 2016-04-27 DIAGNOSIS — I10 Essential (primary) hypertension: Secondary | ICD-10-CM | POA: Diagnosis not present

## 2016-04-27 DIAGNOSIS — I4891 Unspecified atrial fibrillation: Secondary | ICD-10-CM | POA: Diagnosis not present

## 2016-05-09 DIAGNOSIS — I119 Hypertensive heart disease without heart failure: Secondary | ICD-10-CM | POA: Diagnosis not present

## 2016-05-09 DIAGNOSIS — Z7901 Long term (current) use of anticoagulants: Secondary | ICD-10-CM | POA: Diagnosis not present

## 2016-05-09 DIAGNOSIS — R0602 Shortness of breath: Secondary | ICD-10-CM | POA: Diagnosis not present

## 2016-05-09 DIAGNOSIS — E668 Other obesity: Secondary | ICD-10-CM | POA: Diagnosis not present

## 2016-05-09 DIAGNOSIS — I2510 Atherosclerotic heart disease of native coronary artery without angina pectoris: Secondary | ICD-10-CM | POA: Diagnosis not present

## 2016-05-09 DIAGNOSIS — I480 Paroxysmal atrial fibrillation: Secondary | ICD-10-CM | POA: Diagnosis not present

## 2016-05-09 DIAGNOSIS — N183 Chronic kidney disease, stage 3 (moderate): Secondary | ICD-10-CM | POA: Diagnosis not present

## 2016-05-09 DIAGNOSIS — I447 Left bundle-branch block, unspecified: Secondary | ICD-10-CM | POA: Diagnosis not present

## 2016-05-09 DIAGNOSIS — I5032 Chronic diastolic (congestive) heart failure: Secondary | ICD-10-CM | POA: Diagnosis not present

## 2016-05-09 DIAGNOSIS — R0609 Other forms of dyspnea: Secondary | ICD-10-CM | POA: Diagnosis not present

## 2016-05-25 DIAGNOSIS — I519 Heart disease, unspecified: Secondary | ICD-10-CM | POA: Diagnosis not present

## 2016-05-25 DIAGNOSIS — Z23 Encounter for immunization: Secondary | ICD-10-CM | POA: Diagnosis not present

## 2016-05-25 DIAGNOSIS — N289 Disorder of kidney and ureter, unspecified: Secondary | ICD-10-CM | POA: Diagnosis not present

## 2016-05-25 DIAGNOSIS — I509 Heart failure, unspecified: Secondary | ICD-10-CM | POA: Diagnosis not present

## 2016-05-25 DIAGNOSIS — I10 Essential (primary) hypertension: Secondary | ICD-10-CM | POA: Diagnosis not present

## 2016-05-26 DIAGNOSIS — H31093 Other chorioretinal scars, bilateral: Secondary | ICD-10-CM | POA: Diagnosis not present

## 2016-05-26 DIAGNOSIS — I10 Essential (primary) hypertension: Secondary | ICD-10-CM | POA: Diagnosis not present

## 2016-05-26 DIAGNOSIS — Z961 Presence of intraocular lens: Secondary | ICD-10-CM | POA: Diagnosis not present

## 2016-05-26 DIAGNOSIS — H35433 Paving stone degeneration of retina, bilateral: Secondary | ICD-10-CM | POA: Diagnosis not present

## 2016-05-26 DIAGNOSIS — H04123 Dry eye syndrome of bilateral lacrimal glands: Secondary | ICD-10-CM | POA: Diagnosis not present

## 2016-06-01 NOTE — Progress Notes (Signed)
Cardiology Office Note    Date:  06/01/2016   ID:  Mikayla, Kelley [birth year], MRN [id number]  PCP:  No primary care provider on file.  Cardiologist:  Kirk Ruths, MD   Chief Complaint  Patient presents with  . Atrial Fibrillation    New patient evaluation    History of Present Illness:  [name] is a 82 y.o. female For evaluation of atrial fibrillation. Patient has had previous PCI of her LAD. Nuclear study 2004 showed ejection fraction 58%, artifact but no ischemia. Echocardiogram January 2016 showed normal LV systolic function, mild aortic insufficiency, moderate to severe mitral regurgitation, biatrial enlargement, mild right ventricular enlargement, moderate tricuspid regurgitation and moderate pulmonary hypertension.     Past Medical History  Diagnosis Date  . CHF (congestive heart failure) ([place])   . LBBB (left bundle branch block)   . A-fib ([place]) 11/27/2014  . Bradycardia   . Hyperlipidemia   . Hypertension   . CAD (coronary artery disease) 2003    LAD stent. Mild disease left  . On home oxygen therapy     "only at night; ? setting" (11/27/2014)  . GERD (gastroesophageal reflux disease)   . History of hiatal hernia   . Migraine     "very rare now" (11/27/2014)  . Osteoarthritis cervical spine   . Anxiety     "some; not treated for it" (11/27/2014)  . Kidney failure     "hx; did not have to be treated; it got better" (11/27/2014)    Past Surgical History  Procedure Laterality Date  . Abdominal hysterectomy    . Cystocele repair    . Cataract extraction w/ intraocular lens  implant, bilateral Bilateral     Current Medications: Outpatient Prescriptions Prior to Visit  Medication Sig Dispense Refill  . acetaminophen (TYLENOL) 325 MG tablet Take 2 tablets (650 mg total) by mouth every 6 (six) hours as needed for mild pain (or Fever >/= 101). 30 tablet 1  . apixaban (ELIQUIS) 5 MG TABS tablet Take 1 tablet (5 mg total) by mouth 2 (two) times daily.  60 tablet 1  . atenolol (TENORMIN) 50 MG tablet Take 50 mg by mouth daily.    Marland Kitchen atorvastatin (LIPITOR) 80 MG tablet Take 80 mg by mouth daily.    . citalopram (CELEXA) 20 MG tablet Take 1 tablet (20 mg total) by mouth daily. 90 tablet 0  . diltiazem (CARDIZEM CD) 240 MG 24 hr capsule Take 1 capsule (240 mg total) by mouth daily. 30 capsule 1  . estradiol (CLIMARA - DOSED IN MG/24 HR) 0.0375 mg/24hr Place 1 patch (0.0375 mg total) onto the skin once a week. 12 patch 0  . omeprazole (PRILOSEC) 20 MG capsule Take 1 capsule (20 mg total) by mouth daily. 90 capsule 3   No facility-administered medications prior to visit.     Allergies:   Ampicillin; Hydromorphone hcl; and Penicillins   Social History   Social History  . Marital Status: Divorced    Spouse Name: N/A  . Number of Children: 3  . Years of Education: college   Occupational History  . Retired     Social History Main Topics  . Smoking status: Never Smoker   . Smokeless tobacco: Never Used  . Alcohol Use: No  . Drug Use: No  . Sexual Activity: No   Other Topics Concern  . Not on file   Social History Narrative   Drinks tea, occasional coffee     Family  History:  The patient's family history includes Heart Problems in her mother; Heart attack in her father and mother; Hypertension in her father and mother.   ROS:   Please see the history of present illness.    No weight loss, productive cough, hemoptysis, dysphagia, odynophagia, melena, hematochezia, dysuria, hematuria, rash, seizure activity, orthopnea, PND, pedal edema, claudication. All remaining systems negative.   PHYSICAL EXAM:   VS:  There were no vitals taken for this visit.   GEN: Well nourished, well developed, in no acute distress HEENT: normal Neck: no JVD, carotid bruits, or masses Cardiac: RRR; no murmurs, rubs, or gallops,no edema  Respiratory:  clear to auscultation bilaterally, normal work of breathing GI: soft, nontender, nondistended, + BS MS:  no deformity or atrophy Skin: warm and dry, no rash Neuro:  Alert and Oriented x 3, Strength and sensation are intact Psych: euthymic mood, full affect  Wt Readings from Last 3 Encounters:  01/20/16 198 lb (89.812 kg)  12/02/14 189 lb 6 oz (85.9 kg)  02/14/11 203 lb (92.08 kg)      Studies/Labs Reviewed:   EKG:  EKG   Recent Labs: No results found for requested labs within last 365 days.   Lipid Panel No results found for: CHOL, TRIG, HDL, CHOLHDL, VLDL, LDLCALC, LDLDIRECT  Additional studies/ records that were reviewed today include:      ASSESSMENT:    No diagnosis found.   PLAN:  In order of problems listed above:  1     Medication Adjustments/Labs and Tests Ordered: Current medicines are reviewed at length with the patient today.  Concerns regarding medicines are outlined above.  Medication changes, Labs and Tests ordered today are listed in the Patient Instructions below. There are no Patient Instructions on file for this visit.   Signed, Kirk Ruths, MD  06/01/2016 12:05 PM    [place]    This encounter was created in error - please disregard.

## 2016-06-06 ENCOUNTER — Encounter: Payer: Medicare Other | Admitting: Cardiology

## 2016-07-22 ENCOUNTER — Other Ambulatory Visit: Payer: Self-pay

## 2016-08-29 DIAGNOSIS — E668 Other obesity: Secondary | ICD-10-CM | POA: Diagnosis not present

## 2016-08-29 DIAGNOSIS — R0602 Shortness of breath: Secondary | ICD-10-CM | POA: Diagnosis not present

## 2016-08-29 DIAGNOSIS — I2510 Atherosclerotic heart disease of native coronary artery without angina pectoris: Secondary | ICD-10-CM | POA: Diagnosis not present

## 2016-08-29 DIAGNOSIS — I5032 Chronic diastolic (congestive) heart failure: Secondary | ICD-10-CM | POA: Diagnosis not present

## 2016-08-29 DIAGNOSIS — I447 Left bundle-branch block, unspecified: Secondary | ICD-10-CM | POA: Diagnosis not present

## 2016-08-29 DIAGNOSIS — R0609 Other forms of dyspnea: Secondary | ICD-10-CM | POA: Diagnosis not present

## 2016-08-29 DIAGNOSIS — Z7901 Long term (current) use of anticoagulants: Secondary | ICD-10-CM | POA: Diagnosis not present

## 2016-08-29 DIAGNOSIS — I119 Hypertensive heart disease without heart failure: Secondary | ICD-10-CM | POA: Diagnosis not present

## 2016-08-29 DIAGNOSIS — N183 Chronic kidney disease, stage 3 (moderate): Secondary | ICD-10-CM | POA: Diagnosis not present

## 2016-08-29 DIAGNOSIS — I480 Paroxysmal atrial fibrillation: Secondary | ICD-10-CM | POA: Diagnosis not present

## 2016-10-14 DIAGNOSIS — N289 Disorder of kidney and ureter, unspecified: Secondary | ICD-10-CM | POA: Diagnosis not present

## 2016-10-17 DIAGNOSIS — Z23 Encounter for immunization: Secondary | ICD-10-CM | POA: Diagnosis not present

## 2016-10-17 DIAGNOSIS — I4891 Unspecified atrial fibrillation: Secondary | ICD-10-CM | POA: Diagnosis not present

## 2016-10-17 DIAGNOSIS — N289 Disorder of kidney and ureter, unspecified: Secondary | ICD-10-CM | POA: Diagnosis not present

## 2016-10-17 DIAGNOSIS — R51 Headache: Secondary | ICD-10-CM | POA: Diagnosis not present

## 2016-10-17 DIAGNOSIS — I10 Essential (primary) hypertension: Secondary | ICD-10-CM | POA: Diagnosis not present

## 2016-11-01 ENCOUNTER — Telehealth: Payer: Self-pay | Admitting: Neurology

## 2016-11-01 DIAGNOSIS — G4733 Obstructive sleep apnea (adult) (pediatric): Secondary | ICD-10-CM

## 2016-11-01 DIAGNOSIS — G4761 Periodic limb movement disorder: Secondary | ICD-10-CM

## 2016-11-01 DIAGNOSIS — G4734 Idiopathic sleep related nonobstructive alveolar hypoventilation: Secondary | ICD-10-CM

## 2016-11-01 DIAGNOSIS — G4720 Circadian rhythm sleep disorder, unspecified type: Secondary | ICD-10-CM

## 2016-11-01 NOTE — Telephone Encounter (Signed)
Patient called back to schedule her CPAP from April and I can't see an order to attach.  Could you please place another order for CPAP for this patient?

## 2016-11-01 NOTE — Telephone Encounter (Signed)
Ok to reorder the cpap titration for this pt?  Last seen on 01/20/2016, was contacted with PSG results on 02/05/2016, but was unable to schedule her cpap titration at that time.

## 2016-11-02 NOTE — Telephone Encounter (Signed)
Reordered CPAP titration study.

## 2016-11-03 ENCOUNTER — Telehealth: Payer: Self-pay

## 2016-11-03 NOTE — Telephone Encounter (Signed)
Patient came to sleep lab for a cpap titration. The showed her different mask and attempted to put her on cpap. After working with her for 45 minutes, patient could not tolerate cpap. She decided to go home and would like a F/U appointment to discuss other options.

## 2016-11-03 NOTE — Telephone Encounter (Signed)
I spoke to pt and offered her a sooner appt with Dr. Rexene Alberts for 11/07/16 at 8:30 or 9:30am. Pt declined and said that she needs an afternoon appt. Next available afternoon appt is 01/04/17 at 2:00pm. Pt accepted that appt. Pt verbalized understanding.

## 2016-11-03 NOTE — Telephone Encounter (Signed)
I called pt to offer her an appt on 11/07/16 at 8:30am with Dr. Rexene Alberts. No answer, left a message asking her to call me back.

## 2016-11-23 DIAGNOSIS — N289 Disorder of kidney and ureter, unspecified: Secondary | ICD-10-CM | POA: Diagnosis not present

## 2016-11-23 DIAGNOSIS — Z79899 Other long term (current) drug therapy: Secondary | ICD-10-CM | POA: Diagnosis not present

## 2016-11-23 DIAGNOSIS — I10 Essential (primary) hypertension: Secondary | ICD-10-CM | POA: Diagnosis not present

## 2016-11-25 DIAGNOSIS — I10 Essential (primary) hypertension: Secondary | ICD-10-CM | POA: Diagnosis not present

## 2016-11-25 DIAGNOSIS — R51 Headache: Secondary | ICD-10-CM | POA: Diagnosis not present

## 2016-11-25 DIAGNOSIS — N289 Disorder of kidney and ureter, unspecified: Secondary | ICD-10-CM | POA: Diagnosis not present

## 2016-11-25 DIAGNOSIS — Z6833 Body mass index (BMI) 33.0-33.9, adult: Secondary | ICD-10-CM | POA: Diagnosis not present

## 2017-01-04 ENCOUNTER — Ambulatory Visit: Payer: Self-pay | Admitting: Neurology

## 2017-01-25 DIAGNOSIS — Z79899 Other long term (current) drug therapy: Secondary | ICD-10-CM | POA: Diagnosis not present

## 2017-01-25 DIAGNOSIS — E785 Hyperlipidemia, unspecified: Secondary | ICD-10-CM | POA: Diagnosis not present

## 2017-01-25 DIAGNOSIS — I509 Heart failure, unspecified: Secondary | ICD-10-CM | POA: Diagnosis not present

## 2017-01-25 DIAGNOSIS — I10 Essential (primary) hypertension: Secondary | ICD-10-CM | POA: Diagnosis not present

## 2017-01-26 DIAGNOSIS — I10 Essential (primary) hypertension: Secondary | ICD-10-CM | POA: Diagnosis not present

## 2017-01-26 DIAGNOSIS — N289 Disorder of kidney and ureter, unspecified: Secondary | ICD-10-CM | POA: Diagnosis not present

## 2017-01-26 DIAGNOSIS — I4891 Unspecified atrial fibrillation: Secondary | ICD-10-CM | POA: Diagnosis not present

## 2017-01-26 DIAGNOSIS — E785 Hyperlipidemia, unspecified: Secondary | ICD-10-CM | POA: Diagnosis not present

## 2017-03-09 ENCOUNTER — Encounter: Payer: Self-pay | Admitting: Cardiology

## 2017-03-09 DIAGNOSIS — N183 Chronic kidney disease, stage 3 (moderate): Secondary | ICD-10-CM | POA: Diagnosis not present

## 2017-03-09 DIAGNOSIS — I5032 Chronic diastolic (congestive) heart failure: Secondary | ICD-10-CM | POA: Diagnosis not present

## 2017-03-09 DIAGNOSIS — E668 Other obesity: Secondary | ICD-10-CM | POA: Diagnosis not present

## 2017-03-09 DIAGNOSIS — R0609 Other forms of dyspnea: Secondary | ICD-10-CM | POA: Diagnosis not present

## 2017-03-09 DIAGNOSIS — I2510 Atherosclerotic heart disease of native coronary artery without angina pectoris: Secondary | ICD-10-CM | POA: Diagnosis not present

## 2017-03-09 DIAGNOSIS — I119 Hypertensive heart disease without heart failure: Secondary | ICD-10-CM | POA: Diagnosis not present

## 2017-03-09 DIAGNOSIS — R0602 Shortness of breath: Secondary | ICD-10-CM | POA: Diagnosis not present

## 2017-03-09 DIAGNOSIS — I447 Left bundle-branch block, unspecified: Secondary | ICD-10-CM | POA: Diagnosis not present

## 2017-03-09 DIAGNOSIS — I480 Paroxysmal atrial fibrillation: Secondary | ICD-10-CM | POA: Diagnosis not present

## 2017-03-09 DIAGNOSIS — Z7901 Long term (current) use of anticoagulants: Secondary | ICD-10-CM | POA: Diagnosis not present

## 2017-06-08 DIAGNOSIS — I10 Essential (primary) hypertension: Secondary | ICD-10-CM | POA: Diagnosis not present

## 2017-06-08 DIAGNOSIS — Z79899 Other long term (current) drug therapy: Secondary | ICD-10-CM | POA: Diagnosis not present

## 2017-06-09 DIAGNOSIS — N289 Disorder of kidney and ureter, unspecified: Secondary | ICD-10-CM | POA: Diagnosis not present

## 2017-06-09 DIAGNOSIS — S91301S Unspecified open wound, right foot, sequela: Secondary | ICD-10-CM | POA: Diagnosis not present

## 2017-06-09 DIAGNOSIS — Z0000 Encounter for general adult medical examination without abnormal findings: Secondary | ICD-10-CM | POA: Diagnosis not present

## 2017-06-09 DIAGNOSIS — Z23 Encounter for immunization: Secondary | ICD-10-CM | POA: Diagnosis not present

## 2017-06-09 DIAGNOSIS — Z6831 Body mass index (BMI) 31.0-31.9, adult: Secondary | ICD-10-CM | POA: Diagnosis not present

## 2017-06-09 DIAGNOSIS — E782 Mixed hyperlipidemia: Secondary | ICD-10-CM | POA: Diagnosis not present

## 2017-06-09 DIAGNOSIS — I10 Essential (primary) hypertension: Secondary | ICD-10-CM | POA: Diagnosis not present

## 2017-06-17 ENCOUNTER — Emergency Department (HOSPITAL_COMMUNITY): Payer: Medicare Other

## 2017-06-17 ENCOUNTER — Encounter (HOSPITAL_COMMUNITY): Payer: Self-pay | Admitting: Emergency Medicine

## 2017-06-17 ENCOUNTER — Inpatient Hospital Stay (HOSPITAL_COMMUNITY)
Admission: EM | Admit: 2017-06-17 | Discharge: 2017-06-21 | DRG: 291 | Disposition: A | Discharge status: Home / Self Care | Payer: Medicare Other | Attending: Internal Medicine | Admitting: Internal Medicine

## 2017-06-17 DIAGNOSIS — N183 Chronic kidney disease, stage 3 unspecified: Secondary | ICD-10-CM

## 2017-06-17 DIAGNOSIS — Z880 Allergy status to penicillin: Secondary | ICD-10-CM | POA: Diagnosis not present

## 2017-06-17 DIAGNOSIS — I509 Heart failure, unspecified: Secondary | ICD-10-CM

## 2017-06-17 DIAGNOSIS — Z79899 Other long term (current) drug therapy: Secondary | ICD-10-CM | POA: Diagnosis not present

## 2017-06-17 DIAGNOSIS — G43909 Migraine, unspecified, not intractable, without status migrainosus: Secondary | ICD-10-CM | POA: Diagnosis present

## 2017-06-17 DIAGNOSIS — R0600 Dyspnea, unspecified: Secondary | ICD-10-CM

## 2017-06-17 DIAGNOSIS — E871 Hypo-osmolality and hyponatremia: Secondary | ICD-10-CM | POA: Diagnosis not present

## 2017-06-17 DIAGNOSIS — I5043 Acute on chronic combined systolic (congestive) and diastolic (congestive) heart failure: Secondary | ICD-10-CM | POA: Diagnosis present

## 2017-06-17 DIAGNOSIS — N19 Unspecified kidney failure: Secondary | ICD-10-CM

## 2017-06-17 DIAGNOSIS — Z888 Allergy status to other drugs, medicaments and biological substances status: Secondary | ICD-10-CM

## 2017-06-17 DIAGNOSIS — Z6832 Body mass index (BMI) 32.0-32.9, adult: Secondary | ICD-10-CM

## 2017-06-17 DIAGNOSIS — I447 Left bundle-branch block, unspecified: Secondary | ICD-10-CM | POA: Diagnosis not present

## 2017-06-17 DIAGNOSIS — E876 Hypokalemia: Secondary | ICD-10-CM | POA: Diagnosis present

## 2017-06-17 DIAGNOSIS — K5900 Constipation, unspecified: Secondary | ICD-10-CM | POA: Diagnosis present

## 2017-06-17 DIAGNOSIS — Z961 Presence of intraocular lens: Secondary | ICD-10-CM | POA: Diagnosis present

## 2017-06-17 DIAGNOSIS — N3090 Cystitis, unspecified without hematuria: Secondary | ICD-10-CM | POA: Diagnosis present

## 2017-06-17 DIAGNOSIS — I480 Paroxysmal atrial fibrillation: Secondary | ICD-10-CM | POA: Diagnosis not present

## 2017-06-17 DIAGNOSIS — Z9889 Other specified postprocedural states: Secondary | ICD-10-CM

## 2017-06-17 DIAGNOSIS — K219 Gastro-esophageal reflux disease without esophagitis: Secondary | ICD-10-CM | POA: Diagnosis present

## 2017-06-17 DIAGNOSIS — E875 Hyperkalemia: Secondary | ICD-10-CM | POA: Diagnosis present

## 2017-06-17 DIAGNOSIS — E785 Hyperlipidemia, unspecified: Secondary | ICD-10-CM | POA: Diagnosis present

## 2017-06-17 DIAGNOSIS — G4730 Sleep apnea, unspecified: Secondary | ICD-10-CM | POA: Diagnosis present

## 2017-06-17 DIAGNOSIS — I119 Hypertensive heart disease without heart failure: Secondary | ICD-10-CM | POA: Diagnosis present

## 2017-06-17 DIAGNOSIS — I4891 Unspecified atrial fibrillation: Secondary | ICD-10-CM | POA: Diagnosis not present

## 2017-06-17 DIAGNOSIS — I5021 Acute systolic (congestive) heart failure: Secondary | ICD-10-CM | POA: Diagnosis not present

## 2017-06-17 DIAGNOSIS — Z9842 Cataract extraction status, left eye: Secondary | ICD-10-CM | POA: Diagnosis not present

## 2017-06-17 DIAGNOSIS — Z9841 Cataract extraction status, right eye: Secondary | ICD-10-CM | POA: Diagnosis not present

## 2017-06-17 DIAGNOSIS — N179 Acute kidney failure, unspecified: Secondary | ICD-10-CM | POA: Diagnosis present

## 2017-06-17 DIAGNOSIS — I130 Hypertensive heart and chronic kidney disease with heart failure and stage 1 through stage 4 chronic kidney disease, or unspecified chronic kidney disease: Principal | ICD-10-CM | POA: Diagnosis present

## 2017-06-17 DIAGNOSIS — R000 Tachycardia, unspecified: Secondary | ICD-10-CM | POA: Diagnosis not present

## 2017-06-17 DIAGNOSIS — R0602 Shortness of breath: Secondary | ICD-10-CM | POA: Diagnosis not present

## 2017-06-17 DIAGNOSIS — Z9981 Dependence on supplemental oxygen: Secondary | ICD-10-CM | POA: Diagnosis not present

## 2017-06-17 DIAGNOSIS — R069 Unspecified abnormalities of breathing: Secondary | ICD-10-CM | POA: Diagnosis not present

## 2017-06-17 DIAGNOSIS — I482 Chronic atrial fibrillation, unspecified: Secondary | ICD-10-CM

## 2017-06-17 DIAGNOSIS — I481 Persistent atrial fibrillation: Secondary | ICD-10-CM | POA: Diagnosis present

## 2017-06-17 DIAGNOSIS — Z90710 Acquired absence of both cervix and uterus: Secondary | ICD-10-CM | POA: Diagnosis not present

## 2017-06-17 DIAGNOSIS — E669 Obesity, unspecified: Secondary | ICD-10-CM | POA: Diagnosis present

## 2017-06-17 DIAGNOSIS — I10 Essential (primary) hypertension: Secondary | ICD-10-CM | POA: Diagnosis not present

## 2017-06-17 DIAGNOSIS — Z955 Presence of coronary angioplasty implant and graft: Secondary | ICD-10-CM

## 2017-06-17 DIAGNOSIS — Z8249 Family history of ischemic heart disease and other diseases of the circulatory system: Secondary | ICD-10-CM

## 2017-06-17 DIAGNOSIS — Z7901 Long term (current) use of anticoagulants: Secondary | ICD-10-CM

## 2017-06-17 DIAGNOSIS — I5023 Acute on chronic systolic (congestive) heart failure: Secondary | ICD-10-CM | POA: Diagnosis not present

## 2017-06-17 DIAGNOSIS — I2510 Atherosclerotic heart disease of native coronary artery without angina pectoris: Secondary | ICD-10-CM | POA: Diagnosis present

## 2017-06-17 DIAGNOSIS — I4819 Other persistent atrial fibrillation: Secondary | ICD-10-CM | POA: Diagnosis present

## 2017-06-17 DIAGNOSIS — Z9119 Patient's noncompliance with other medical treatment and regimen: Secondary | ICD-10-CM

## 2017-06-17 DIAGNOSIS — E039 Hypothyroidism, unspecified: Secondary | ICD-10-CM | POA: Diagnosis present

## 2017-06-17 HISTORY — DX: Chronic kidney disease, stage 3 (moderate): N18.3

## 2017-06-17 HISTORY — DX: Obesity, unspecified: E66.9

## 2017-06-17 HISTORY — DX: Atherosclerotic heart disease of native coronary artery without angina pectoris: I25.10

## 2017-06-17 HISTORY — DX: Chronic kidney disease, stage 3 unspecified: N18.30

## 2017-06-17 HISTORY — DX: Sleep apnea, unspecified: G47.30

## 2017-06-17 LAB — TROPONIN I: Troponin I: <0.03 ng/mL (ref <0.03)

## 2017-06-17 LAB — BASIC METABOLIC PANEL
Anion gap: 10 (ref 5–15)
BUN: 19 mg/dL (ref 6–20)
CO2: 25 mmol/L (ref 22–32)
CREATININE: 1.49 mg/dL — AB (ref 0.44–1.00)
Calcium: 9 mg/dL (ref 8.9–10.3)
Chloride: 95 mmol/L — ABNORMAL LOW (ref 101–111)
GFR calc Af Amer: 36 mL/min — ABNORMAL LOW (ref >60)
GFR calc non Af Amer: 31 mL/min — ABNORMAL LOW (ref >60)
GLUCOSE: 124 mg/dL — AB (ref 65–99)
POTASSIUM: 4.9 mmol/L (ref 3.5–5.1)
Sodium: 130 mmol/L — ABNORMAL LOW (ref 135–145)

## 2017-06-17 LAB — CBC WITH DIFFERENTIAL/PLATELET
Basophils Absolute: 0 10*3/uL (ref 0.0–0.1)
Basophils Relative: 0 %
EOS ABS: 0.1 10*3/uL (ref 0.0–0.7)
EOS PCT: 1 %
HCT: 36.4 % (ref 36.0–46.0)
Hemoglobin: 11.5 g/dL — ABNORMAL LOW (ref 12.0–15.0)
LYMPHS ABS: 2.1 10*3/uL (ref 0.7–4.0)
Lymphocytes Relative: 17 %
MCH: 29.3 pg (ref 26.0–34.0)
MCHC: 31.6 g/dL (ref 30.0–36.0)
MCV: 92.6 fL (ref 78.0–100.0)
Monocytes Absolute: 0.7 10*3/uL (ref 0.1–1.0)
Monocytes Relative: 6 %
Neutro Abs: 9.6 10*3/uL — ABNORMAL HIGH (ref 1.7–7.7)
Neutrophils Relative %: 76 %
PLATELETS: 284 10*3/uL (ref 150–400)
RBC: 3.93 MIL/uL (ref 3.87–5.11)
RDW: 15.6 % — ABNORMAL HIGH (ref 11.5–15.5)
WBC: 12.4 10*3/uL — ABNORMAL HIGH (ref 4.0–10.5)

## 2017-06-17 LAB — BRAIN NATRIURETIC PEPTIDE: B NATRIURETIC PEPTIDE 5: 652.8 pg/mL — AB (ref 0.0–100.0)

## 2017-06-17 MED ORDER — SODIUM CHLORIDE 0.9% FLUSH: 3.0000 mL | INTRAVENOUS | Status: DC | PRN

## 2017-06-17 MED ORDER — ATENOLOL 50 MG PO TABS
50.0000 mg | ORAL_TABLET | Freq: Every day | ORAL | Status: DC
  Administered 2017-06-18 – 2017-06-21 (×4): 50 mg via ORAL
  Filled 2017-06-17 (×4): qty 1

## 2017-06-17 MED ORDER — DILTIAZEM HCL ER COATED BEADS 180 MG PO CP24
180.0000 mg | ORAL_CAPSULE | Freq: Every day | ORAL | Status: DC
  Administered 2017-06-18 – 2017-06-21 (×4): 180 mg via ORAL
  Filled 2017-06-17 (×4): qty 1

## 2017-06-17 MED ORDER — FUROSEMIDE 10 MG/ML IJ SOLN
20.0000 mg | Freq: Once | INTRAMUSCULAR | Status: AC
  Administered 2017-06-17: 20 mg via INTRAVENOUS
  Filled 2017-06-17: qty 2

## 2017-06-17 MED ORDER — ONDANSETRON HCL 4 MG/2ML IJ SOLN: 4.0000 mg | Freq: Four times a day (QID) | INTRAMUSCULAR | Status: DC | PRN

## 2017-06-17 MED ORDER — ACETAMINOPHEN 325 MG PO TABS
650.0000 mg | ORAL_TABLET | Freq: Four times a day (QID) | ORAL | Status: DC | PRN
  Administered 2017-06-18 – 2017-06-20 (×4): 650 mg via ORAL
  Filled 2017-06-17 (×4): qty 2

## 2017-06-17 MED ORDER — ATORVASTATIN CALCIUM 80 MG PO TABS
80.0000 mg | ORAL_TABLET | Freq: Every day | ORAL | Status: DC
  Administered 2017-06-17 – 2017-06-21 (×5): 80 mg via ORAL
  Filled 2017-06-17 (×5): qty 1

## 2017-06-17 MED ORDER — BUTALBITAL-APAP-CAFFEINE 50-325-40 MG PO TABS: 1.0000 | ORAL_TABLET | Freq: Two times a day (BID) | ORAL | Status: DC | PRN

## 2017-06-17 MED ORDER — SODIUM CHLORIDE 0.9% FLUSH
3.0000 mL | Freq: Two times a day (BID) | INTRAVENOUS | Status: DC
  Administered 2017-06-17 – 2017-06-21 (×7): 3 mL via INTRAVENOUS

## 2017-06-17 MED ORDER — FUROSEMIDE 10 MG/ML IJ SOLN
20.0000 mg | Freq: Two times a day (BID) | INTRAMUSCULAR | Status: DC
  Administered 2017-06-18 (×2): 20 mg via INTRAVENOUS
  Filled 2017-06-17 (×3): qty 2

## 2017-06-17 MED ORDER — AMIODARONE HCL 200 MG PO TABS
200.0000 mg | ORAL_TABLET | Freq: Every day | ORAL | Status: DC
  Administered 2017-06-17 – 2017-06-21 (×5): 200 mg via ORAL
  Filled 2017-06-17 (×5): qty 1

## 2017-06-17 MED ORDER — ASPIRIN EC 81 MG PO TBEC
81.0000 mg | DELAYED_RELEASE_TABLET | Freq: Every day | ORAL | Status: DC
  Administered 2017-06-17 – 2017-06-19 (×3): 81 mg via ORAL
  Filled 2017-06-17 (×3): qty 1

## 2017-06-17 MED ORDER — SODIUM CHLORIDE 0.9 % IV SOLN: 250.0000 mL | INTRAVENOUS | Status: DC | PRN

## 2017-06-17 MED ORDER — APIXABAN 5 MG PO TABS
5.0000 mg | ORAL_TABLET | Freq: Two times a day (BID) | ORAL | Status: DC
  Administered 2017-06-17 – 2017-06-19 (×4): 5 mg via ORAL
  Filled 2017-06-17 (×4): qty 1

## 2017-06-17 MED ORDER — LOSARTAN POTASSIUM 50 MG PO TABS
50.0000 mg | ORAL_TABLET | Freq: Every day | ORAL | Status: DC
  Administered 2017-06-17 – 2017-06-18 (×2): 50 mg via ORAL
  Filled 2017-06-17 (×2): qty 1

## 2017-06-17 NOTE — ED Provider Notes (Signed)
[facility] DEPT Provider Note   CSN: [id number] Arrival date & time: 06/17/17  1624     History   Chief Complaint Chief Complaint  Patient presents with  . Shortness of Breath    HPI [name] is a 83 y.o. female.  Patient presents with worsening general fatigue and shortness of breath with lying flat for the past 3 days. Patient was on Lasix in the past but not currently. Patient denies chest pain. Shortness of breath with exertion. No home oxygen. Patient is chronic atrial fibrillation. No new medications. Patient has left bundle branch block history. No fevers or chills. Mild cough.      Past Medical History:  Diagnosis Date  . A-fib ([place]) 11/27/2014  . Anxiety    "some; not treated for it" (11/27/2014)  . Bradycardia   . CAD (coronary artery disease) 2003   LAD stent. Mild disease left  . CHF (congestive heart failure) ([place])   . GERD (gastroesophageal reflux disease)   . History of hiatal hernia   . Hyperlipidemia   . Hypertension   . Kidney failure    "hx; did not have to be treated; it got better" (11/27/2014)  . LBBB (left bundle branch block)   . Migraine    "very rare now" (11/27/2014)  . On home oxygen therapy    "only at night; ? setting" (11/27/2014)  . Osteoarthritis cervical spine     Patient Active Problem List   Diagnosis Date Noted  . Atrial fibrillation with RVR ([place]) 11/29/2014  . SOB (shortness of breath) 11/27/2014  . Fatigue 11/27/2014  . AKI (acute kidney injury) ([place]) 11/27/2014  . HYPERLIPIDEMIA 02/14/2011  . Anxiety state 02/14/2011  . DEPRESSION 02/14/2011  . Essential hypertension 02/14/2011  . Coronary atherosclerosis 02/14/2011    Past Surgical History:  Procedure Laterality Date  . ABDOMINAL HYSTERECTOMY    . CATARACT EXTRACTION W/ INTRAOCULAR LENS  IMPLANT, BILATERAL Bilateral   . CYSTOCELE REPAIR      OB History    No data available       Home Medications    Prior to Admission medications     Medication Sig Start Date End Date Taking? Authorizing Provider  acetaminophen (TYLENOL) 325 MG tablet Take 2 tablets (650 mg total) by mouth every 6 (six) hours as needed for mild pain (or Fever >/= 101). 12/02/14   Kelvin Cellar, MD  apixaban (ELIQUIS) 5 MG TABS tablet Take 1 tablet (5 mg total) by mouth 2 (two) times daily. 12/02/14   Kelvin Cellar, MD  atenolol (TENORMIN) 50 MG tablet Take 50 mg by mouth daily.    [provider]  atorvastatin (LIPITOR) 80 MG tablet Take 80 mg by mouth daily.    [provider]  citalopram (CELEXA) 20 MG tablet Take 1 tablet (20 mg total) by mouth daily. 03/07/12   Plotnikov, Evie Lacks, MD  diltiazem (CARDIZEM CD) 240 MG 24 hr capsule Take 1 capsule (240 mg total) by mouth daily. 12/02/14   Kelvin Cellar, MD  estradiol (CLIMARA - DOSED IN MG/24 HR) 0.0375 mg/24hr Place 1 patch (0.0375 mg total) onto the skin once a week. 03/07/12   Plotnikov, Evie Lacks, MD  omeprazole (PRILOSEC) 20 MG capsule Take 1 capsule (20 mg total) by mouth daily. 03/22/11 03/21/12  Plotnikov, Evie Lacks, MD    Family History Family History  Problem Relation Age of Onset  . Heart attack Mother   . Hypertension Mother   . Heart Problems Mother   .  Heart attack Father   . Hypertension Father     Social History Social History  Substance Use Topics  . Smoking status: Never Smoker  . Smokeless tobacco: Never Used  . Alcohol use No     Allergies   Ampicillin; Hydromorphone hcl; and Penicillins   Review of Systems Review of Systems  Constitutional: Positive for fatigue. Negative for chills and fever.  HENT: Negative for congestion.   Eyes: Negative for visual disturbance.  Respiratory: Positive for cough and shortness of breath.   Cardiovascular: Negative for chest pain.  Gastrointestinal: Negative for abdominal pain and vomiting.  Genitourinary: Negative for dysuria and flank pain.  Musculoskeletal: Negative for back pain, neck pain and neck stiffness.   Skin: Negative for rash.  Neurological: Negative for light-headedness and headaches.     Physical Exam Updated Vital Signs BP (!) 144/80   Pulse (!) 109   Temp 98.2 F (36.8 C) (Oral)   Resp (!) 21   Ht 5' 4.5" (1.638 m)   Wt 88.2 kg (194 lb 7.1 oz)   SpO2 95%   BMI 32.86 kg/m   Physical Exam  Constitutional: She is oriented to person, place, and time. She appears well-developed and well-nourished.  HENT:  Head: Normocephalic and atraumatic.  Eyes: Conjunctivae are normal. Right eye exhibits no discharge. Left eye exhibits no discharge.  Neck: Normal range of motion. Neck supple. No tracheal deviation present.  Cardiovascular: An irregularly irregular rhythm present. Tachycardia present.   Pulmonary/Chest: Effort normal. She has rales (crackles bases bilateral).  Abdominal: Soft. She exhibits no distension. There is no tenderness. There is no guarding.  Musculoskeletal: She exhibits edema (lower ext bilateral).  Neurological: She is alert and oriented to person, place, and time.  Skin: Skin is warm. No rash noted.  Psychiatric: She has a normal mood and affect.  Nursing note and vitals reviewed.    ED Treatments / Results  Labs (all labs ordered are listed, but only abnormal results are displayed) Labs Reviewed  BASIC METABOLIC PANEL - Abnormal; Notable for the following:       Result Value   Sodium 130 (*)    Chloride 95 (*)    Glucose, Bld 124 (*)    Creatinine, Ser 1.49 (*)    GFR calc non Af Amer 31 (*)    GFR calc Af Amer 36 (*)    All other components within normal limits  CBC WITH DIFFERENTIAL/PLATELET - Abnormal; Notable for the following:    WBC 12.4 (*)    Hemoglobin 11.5 (*)    RDW 15.6 (*)    Neutro Abs 9.6 (*)    All other components within normal limits  BRAIN NATRIURETIC PEPTIDE - Abnormal; Notable for the following:    B Natriuretic Peptide 652.8 (*)    All other components within normal limits  TROPONIN I    EKG  EKG  Interpretation  Date/Time:  Saturday June 17 2017 16:31:13 EDT Ventricular Rate:  118 PR Interval:    QRS Duration: 158 QT Interval:  357 QTC Calculation: 501 R Axis:   -117 Text Interpretation:  Atrial fibrillation Ventricular premature complex Nonspecific IVCD with LAD Baseline wander in lead(s) V6 Confirmed by Elnora Morrison [phone]) on 06/17/2017 4:51:32 PM       Radiology Dg Chest 2 View  Result Date: 06/17/2017 CLINICAL DATA:  Shortness of breath EXAM: CHEST  2 VIEW COMPARISON:  11/27/2014 FINDINGS: AP and lateral views of the chest show low volumes. Stable asymmetric elevation right hemidiaphragm.  Cardiopericardial silhouette is at upper limits of normal for size. There is pulmonary vascular congestion without overt pulmonary edema. Probable atelectasis at the bases. Small bilateral pleural effusions are evident. IMPRESSION: Low volume film with vascular congestion and small bilateral pleural effusions. Bibasilar atelectasis. Electronically Signed   By: Misty Stanley M.D.   On: 06/17/2017 17:37    Procedures Procedures (including critical care time)  Medications Ordered in ED Medications  furosemide (LASIX) injection 20 mg (20 mg Intravenous Given 06/17/17 1754)     Initial Impression / Assessment and Plan / ED Course  I have reviewed the triage vital signs and the nursing notes.  Pertinent labs & imaging results that were available during my care of the patient were reviewed by me and considered in my medical decision making (see chart for details).    Patient presents with clinically congestive heart failure. Plan for IV Lasix, blood work, chest x-ray reviewed consistent of and reassessment.  Patient very symptomatic with shortness of breath and heart rate 120s with ambulation. Plan for observation telemetry for diuresis and further assessment.  The patients results and plan were reviewed and discussed.   Any x-rays performed were independently reviewed by myself.    Differential diagnosis were considered with the presenting HPI.  Medications  furosemide (LASIX) injection 20 mg (20 mg Intravenous Given 06/17/17 1754)    Vitals:   06/17/17 1727 06/17/17 1745 06/17/17 1800 06/17/17 1830  BP: (!) 145/72 (!) 142/92 (!) 130/91 (!) 144/80  Pulse: (!) 105 (!) 122 (!) 113 (!) 109  Resp: (!) 25 (!) 26 (!) 21 (!) 21  Temp:      TempSrc:      SpO2: 95% 97% 92% 95%  Weight:      Height:        Final diagnoses:  Chronic atrial fibrillation (HCC)  Dyspnea, unspecified type  Atrial fibrillation with RVR ([place])    Admission/ observation were discussed with the admitting physician, patient and/or family and they are comfortable with the plan. ,  Final Clinical Impressions(s) / ED Diagnoses   Final diagnoses:  Chronic atrial fibrillation ([place])  Dyspnea, unspecified type  Atrial fibrillation with RVR ([place])    New Prescriptions New Prescriptions   No medications on file     Elnora Morrison, MD 06/17/17 1945

## 2017-06-17 NOTE — Progress Notes (Signed)
Pt is alert and oriented HOH from home alone, with standby assist SOB 90s on room air, Iv diuresing and Cardio follow-up consult.

## 2017-06-17 NOTE — ED Triage Notes (Signed)
[name] from home c/o SOB, hX CHF, hx pulm edeme. EMS reports lungs clear, denies recent illness, non productive cough worse with. Weak. NVD since wed. Increased weakness. Not on lasix currently. No chest pain. Chronic right shoulder pain with patch applied. AAOx4. A fib on monitor. Pt states shes had a cough x1 year with SOB. Worse since she had the flu in may.

## 2017-06-17 NOTE — ED Notes (Signed)
Pt wheeled to restroom and urinated unknown amount.

## 2017-06-17 NOTE — ED Notes (Signed)
Pt ambulated on the hallway, pt had to take frequent stops to get hr breath, SPO2 of 96% on RA while ambulating, HR jump to 120's with increase wheezing. Dr. Reather Converse notified.

## 2017-06-17 NOTE — ED Notes (Signed)
Dr Zavitz at bedside  

## 2017-06-17 NOTE — ED Notes (Signed)
Pt wheeled to restroom without issue.

## 2017-06-17 NOTE — ED Notes (Signed)
Report attempted 

## 2017-06-17 NOTE — H&P (Signed)
History and Physical    [name] ERD:[id number] DOB: 1932-03-20 DOA: 06/17/2017  PCP: Fanny Bien, MD  Patient coming from: Home  Chief Complaint: SOB, bloating  HPI: [name] is a 83 y.o. female with medical history significant of OA, migraines, HTN, HLD, GERD, CHF (last TTE in 2016 with preserved EF), Afib on Eliquis and amiodarone, CKD who presents for a month of worsening swelling and SOB.  She notes that she has been getting worse for the past year, but her symptoms really progressed starting Wednesday.  She has noticed increased abdominal bloating, shortness of breath, difficulty lying flat.  She has progressed from needing 1 pillow to sleep to 2 and for the last 2 nights she had to sit up in the recliner to sleep.  She further notes a cough without sputum production and wheezing.  She believes the worst part of her symptoms started in May when she developed the stomach flu and it just never got better.  She also notes increased nausea since starting a new medication at Dr. Thurman Coyer office in April.  She notes that since that time she will have intermittent diarrhea and nausea.  She notes that her symptoms became so bad yesterday that she took an old lasix from 2011 hoping it would help with her SOB.  She reports being on lasix for 20+ years due to chronic swelling in her legs which she notes all of her family have.  She also noted some very mild chest pain which is mainly in the lower middle chest, epigastrium and radiates around to the axilla.  She could not characterize it more than that and was not concerned about it.  SHe further reports a weight gain from 183 - 192 in the last few days.   On questioning, she noted mild pain back pain and suprapubic pain which has been going on for months.    I reviewed the Neurology note from February which noted need for sleep study, appears to have been done in December of 2017.  I do not see any Cardiology notes in our records.   ED  Course: In the ED, she was noted to have a Na of 130, K of 4.9, Cr of 1.49 which appears to be within her baseline.  She had a WBC of 12.4 and a nitrite + UA (squamous cells not mentioned, many bacteria).  BNP was 652.  TnI was < 0.03.  CXR done showed vascular congestion.  EKG showed Afib with RVR of 118.  She had a nonspecific conduction delay.  I reviewed her EKG from 2016 and it appeared similar, likely LBBB.  TTE from 2016 showed preserved EF, diastolic function could not be determined.    Review of Systems: As per HPI otherwise 10 point review of systems negative.   Past Medical History:  Diagnosis Date  . A-fib ([place]) 11/27/2014  . Anxiety    "some; not treated for it" (11/27/2014)  . Bradycardia   . CAD (coronary artery disease) 2003   LAD stent. Mild disease left  . CHF (congestive heart failure) ([place])   . GERD (gastroesophageal reflux disease)   . History of hiatal hernia   . Hyperlipidemia   . Hypertension   . Kidney failure    "hx; did not have to be treated; it got better" (11/27/2014)  . LBBB (left bundle branch block)   . Migraine    "very rare now" (11/27/2014)  . On home oxygen therapy    "only  at night; ? setting" (11/27/2014)  . Osteoarthritis cervical spine     Past Surgical History:  Procedure Laterality Date  . ABDOMINAL HYSTERECTOMY    . CATARACT EXTRACTION W/ INTRAOCULAR LENS  IMPLANT, BILATERAL Bilateral   . CYSTOCELE REPAIR     Reviewed with patient.   reports that she has never smoked. She has never used smokeless tobacco. She reports that she does not drink alcohol or use drugs.  Allergies  Allergen Reactions  . Ampicillin Shortness Of Breath, Swelling and Rash  . Hydromorphone Hcl Shortness Of Breath, Itching, Swelling and Rash  . Penicillins Anaphylaxis    Has patient had a PCN reaction causing immediate rash, facial/tongue/throat swelling, SOB or lightheadedness with hypotension: Yes Has patient had a PCN reaction causing severe rash involving  mucus membranes or skin necrosis: Unknown Has patient had a PCN reaction that required hospitalization: Yes (was in the hosp) Has patient had a PCN reaction occurring within the last 10 years: No If all of the above answers are "NO", then may proceed with Cephalosporin use.    Reviewed with patient.  Family History  Problem Relation Age of Onset  . Heart attack Mother   . Hypertension Mother   . Heart Problems Mother   . Heart attack Father   . Hypertension Father     Prior to Admission medications   Medication Sig Start Date End Date Taking? Authorizing Provider  acetaminophen (TYLENOL) 325 MG tablet Take 2 tablets (650 mg total) by mouth every 6 (six) hours as needed for mild pain (or Fever >/= 101). 12/02/14   Kelvin Cellar, MD  apixaban (ELIQUIS) 5 MG TABS tablet Take 1 tablet (5 mg total) by mouth 2 (two) times daily. 12/02/14   Kelvin Cellar, MD  atenolol (TENORMIN) 50 MG tablet Take 50 mg by mouth daily.    [provider]                diltiazem (CARDIZEM CD) 180 MG 24 hr capsule Take 1 capsule (240 mg total) by mouth daily. 12/02/14   Kelvin Cellar, MD         Losartan-hctz Amiodarone 200mg          Physical Exam: Vitals:   06/17/17 1745 06/17/17 1800 06/17/17 1830 06/17/17 1930  BP: (!) 142/92 (!) 130/91 (!) 144/80 126/80  Pulse: (!) 122 (!) 113 (!) 109 (!) 42  Resp: (!) 26 (!) 21 (!) 21 (!) 25  Temp:      TempSrc:      SpO2: 97% 92% 95% 96%  Weight:      Height:        Constitutional: Sitting up in bed, some SOB with speaking.  Vitals:   06/17/17 1745 06/17/17 1800 06/17/17 1830 06/17/17 1930  BP: (!) 142/92 (!) 130/91 (!) 144/80 126/80  Pulse: (!) 122 (!) 113 (!) 109 (!) 42  Resp: (!) 26 (!) 21 (!) 21 (!) 25  Temp:      TempSrc:      SpO2: 97% 92% 95% 96%  Weight:      Height:       Eyes: PERRL, lids and conjunctivae normal ENMT: Mucous membranes are moist. Posterior pharynx clear of any exudate or lesions.  Neck: normal, supple, + JVD  to mandible Respiratory: + expiratory wheezing, + crackles to mid lung Bays, short of breath but no accessory muscle use.  Cardiovascular: Irreg Irreg, HR in the 90s when I saw her, + JVD.  + non pitting edema to bilateral  LE.  Could not palpate pulses due to swelling, feet were warm.   Abdomen: +BS, no tenderness in the suprapubic area, mild swelling.  Musculoskeletal: Muscle tone normal for age, no clubbing Skin: no rashes, lesions, ulcers.  Neurologic: Grossly intact, moving all extremities.  Psychiatric: Normal judgment and insight. Alert and oriented x 3. Appears fatigued.    Labs on Admission: I have personally reviewed following labs and imaging studies  CBC:  Recent Labs Lab 06/17/17 1644  WBC 12.4*  NEUTROABS 9.6*  HGB 11.5*  HCT 36.4  MCV 92.6  PLT 226   Basic Metabolic Panel:  Recent Labs Lab 06/17/17 1644  NA 130*  K 4.9  CL 95*  CO2 25  GLUCOSE 124*  BUN 19  CREATININE 1.49*  CALCIUM 9.0   GFR: Estimated Creatinine Clearance: 31.1 mL/min (A) (by C-G formula based on SCr of 1.49 mg/dL (H)). Liver Function Tests: No results for input(s): AST, ALT, ALKPHOS, BILITOT, PROT, ALBUMIN in the last 168 hours. No results for input(s): LIPASE, AMYLASE in the last 168 hours. No results for input(s): AMMONIA in the last 168 hours. Coagulation Profile: No results for input(s): INR, PROTIME in the last 168 hours. Cardiac Enzymes:  Recent Labs Lab 06/17/17 1644  TROPONINI <0.03   BNP (last 3 results) No results for input(s): PROBNP in the last 8760 hours. HbA1C: No results for input(s): HGBA1C in the last 72 hours. CBG: No results for input(s): GLUCAP in the last 168 hours. Lipid Profile: No results for input(s): CHOL, HDL, LDLCALC, TRIG, CHOLHDL, LDLDIRECT in the last 72 hours. Thyroid Function Tests: No results for input(s): TSH, T4TOTAL, FREET4, T3FREE, THYROIDAB in the last 72 hours. Anemia Panel: No results for input(s): VITAMINB12, FOLATE, FERRITIN,  TIBC, IRON, RETICCTPCT in the last 72 hours. Urine analysis:    Component Value Date/Time   COLORURINE YELLOW 11/28/2014 1153   APPEARANCEUR CLOUDY (A) 11/28/2014 1153   LABSPEC 1.021 11/28/2014 1153   PHURINE 5.5 11/28/2014 1153   GLUCOSEU NEGATIVE 11/28/2014 1153   HGBUR NEGATIVE 11/28/2014 1153   [place] 11/28/2014 1153   KETONESUR NEGATIVE 11/28/2014 1153   PROTEINUR NEGATIVE 11/28/2014 1153   UROBILINOGEN 0.2 11/28/2014 1153   NITRITE POSITIVE (A) 11/28/2014 1153   LEUKOCYTESUR SMALL (A) 11/28/2014 1153    Radiological Exams on Admission: Dg Chest 2 View  Result Date: 06/17/2017 CLINICAL DATA:  Shortness of breath EXAM: CHEST  2 VIEW COMPARISON:  11/27/2014 FINDINGS: AP and lateral views of the chest show low volumes. Stable asymmetric elevation right hemidiaphragm. Cardiopericardial silhouette is at upper limits of normal for size. There is pulmonary vascular congestion without overt pulmonary edema. Probable atelectasis at the bases. Small bilateral pleural effusions are evident. IMPRESSION: Low volume film with vascular congestion and small bilateral pleural effusions. Bibasilar atelectasis. Electronically Signed   By: Misty Stanley M.D.   On: 06/17/2017 17:37    EKG: Independently reviewed. Afib with RVR, likely LBBB, similar to previous  Assessment/Plan Acute exacerbation of CHF (congestive heart failure)  - Symptoms include SOB, orthopnea, swelling; signs include + JVD, LE swelling; BNP is high - Lasix 20mg  IV given in the ED - Continue lasix 20mg  BID - She is on atenolol, consider changing to coreg or metoprolol based on TTE results - Repeat TTE - Continue Losartan - She is reporting chest pain and worsening symptoms over a year, will rule out for ACS, first trop negative - Trend enzymes - AM EKG - Strict I/O, daily weight.  - Telemetry -  Restart statin, atorvastatin (she was not taking at home)  Atrial fibrillation with RVR  - HR elevated when she  presented, but improved to the high 90s, low 100s when I saw her.  - She is on atenolol, diltiazem and amiodarone which were continued - Telemetry - Continue Eliquis  Hyponatremia - 130 today, down from 136 a year ago - Likely related to volume overload - Monitor closely while on lasix - BMET in the AM  Cystitis - She has a nitrite + UA, and elevated WBC - Will send UC - She is basically asymptomatic, so will only start therapy if her UC is floridly positive - She is allergic to PCN (anaphylaxis), so would avoid cephalosporins     Essential hypertension - BP 845X to 646O systolic - Continue home medications which include losartan, diltiazem and atenolol - I held HCTZ as she is going to be on lasix  CKD - Cr is 1.49 which appears to be within her baseline - Monitor while diuresing  GERD - Start zantac or PPI if she develops symptoms, she is not on it right now  Migraine - She had a bottle of fiorcet as her home medication, will give PRN     DVT prophylaxis: Eliquis BID  Code Status: Full Family Communication: Granddaughter at bedside Disposition Plan: Likely discharge in 2-3 days after diuresis Consults called: None, consider cardiology in the AM, She is a patient of Dr. Wynonia Lawman Admission status: Telemetry, inpatient   Gilles Chiquito MD [facility] Pager [phone]  If 7PM-7AM, please contact night-coverage [email or web address] Password [facility]  06/17/2017, 8:43 PM

## 2017-06-17 NOTE — ED Notes (Signed)
Pt to xray

## 2017-06-18 ENCOUNTER — Inpatient Hospital Stay (HOSPITAL_COMMUNITY): Payer: Medicare Other

## 2017-06-18 ENCOUNTER — Encounter (HOSPITAL_COMMUNITY): Payer: Self-pay | Admitting: Cardiology

## 2017-06-18 DIAGNOSIS — N183 Chronic kidney disease, stage 3 unspecified: Secondary | ICD-10-CM

## 2017-06-18 DIAGNOSIS — E669 Obesity, unspecified: Secondary | ICD-10-CM

## 2017-06-18 DIAGNOSIS — Z7901 Long term (current) use of anticoagulants: Secondary | ICD-10-CM

## 2017-06-18 DIAGNOSIS — G4730 Sleep apnea, unspecified: Secondary | ICD-10-CM

## 2017-06-18 DIAGNOSIS — I447 Left bundle-branch block, unspecified: Secondary | ICD-10-CM | POA: Diagnosis present

## 2017-06-18 HISTORY — DX: Obesity, unspecified: E66.9

## 2017-06-18 HISTORY — DX: Sleep apnea, unspecified: G47.30

## 2017-06-18 LAB — BASIC METABOLIC PANEL
ANION GAP: 10 (ref 5–15)
BUN: 18 mg/dL (ref 6–20)
CHLORIDE: 96 mmol/L — AB (ref 101–111)
CO2: 28 mmol/L (ref 22–32)
CREATININE: 1.66 mg/dL — AB (ref 0.44–1.00)
Calcium: 9.1 mg/dL (ref 8.9–10.3)
GFR calc non Af Amer: 27 mL/min — ABNORMAL LOW (ref >60)
GFR, EST AFRICAN AMERICAN: 32 mL/min — AB (ref >60)
GLUCOSE: 105 mg/dL — AB (ref 65–99)
Potassium: 4.8 mmol/L (ref 3.5–5.1)
Sodium: 134 mmol/L — ABNORMAL LOW (ref 135–145)

## 2017-06-18 LAB — ECHOCARDIOGRAM COMPLETE
HEIGHTINCHES: 64 in
Weight: 2976 oz

## 2017-06-18 LAB — OSMOLALITY, URINE: OSMOLALITY UR: 246 mosm/kg — AB (ref 300–900)

## 2017-06-18 LAB — TROPONIN I: Troponin I: <0.03 ng/mL (ref <0.03)

## 2017-06-18 LAB — SODIUM, URINE, RANDOM: SODIUM UR: 103 mmol/L

## 2017-06-18 NOTE — Progress Notes (Signed)
[name]  Date of visit:  03/09/2017 DOB:  [birth year]    Age:  83 yrs. Medical record number:  [id number]     Account number:  [id number] Primary Care Provider: Rachell Cipro ____________________________ CURRENT DIAGNOSES  1. CAD Native without angina  2. Chronic diastolic heart failure  3. Dyspnea  4. Hypertensive heart disease without heart failure  5. Paroxysmal Atrial Fibrillation  6. Left bundle-branch block  7. Chronic kidney disease, stage 3 (moderate)  8. Long term (current) use of anticoagulants  9. Obesity  10. Dyspnea ____________________________ ALLERGIES  Ampicillin, Rash  Hydromorphone, Rash  Penicillins, Anaphylaxis ____________________________ MEDICATIONS  1. amiodarone 200 mg tablet, BID  2. atenolol 50 mg tablet, 1 p.o. daily  3. atorvastatin 80 mg tablet, 1 p.o. daily  4. cetirizine 10 mg tablet, 1 p.o. daily PRN  5. diltiazem ER 180 mg tablet,extended release 24 hr, 1 p.o. daily  6. Eliquis 5 mg tablet, BID  7. Excedrin Migraine 250 mg-250 mg-65 mg tablet, PRN  8. Fiorinal 50 mg-325 mg-40 mg capsule, PRN  9. losartan 100 mg-hydrochlorothiazide 25 mg tablet, 1/2 tab daily  10. omeprazole 20 mg tablet,delayed release, 1 p.o. daily PRN  11. Tylenol 325 mg tablet, PRN ____________________________ CHIEF COMPLAINTS  Followup of Dyspnea ____________________________ HISTORY OF PRESENT ILLNESS Patient returns for cardiac followup. She had the flu earlier in the year and has been dyspneic ever since then. She complains of severe dyspnea with exertion and had to stop several times coming in today. She denies angina and is unaware that her heart is out of rhythm. She has had some mild worsening of edema. She denies PND, orthopnea or claudication. She has not missed any doses of her anticoagulation. Her blood pressure medicines have been adjusted somewhat recently because of some hypotension. Lab work from recent physician is currently  reviewed. ____________________________ PAST HISTORY  Past Medical Illnesses:  hypertension, hyperlipidemia, GERD, cervical disc disease, chronic kidney disease Stage 3, migraine headaches, obesity;  Cardiovascular Illnesses:  CAD, paroxysmal atrial fibrillation, LBBB;  Infectious Diseases:  no previous history of significant infectious diseases;  Surgical Procedures:  cystocoele repair, hysterectomy;  Trauma History:  no previous history of significant trauma;  NYHA Classification:  I;  Canadian Angina Classification:  Class 0: Asymptomatic;  Cardiology Procedures-Invasive:  cardiac cath (left) 2003, stent 2003;  Cardiology Procedures-Noninvasive:  echocardiogram January 2016;  Cardiac Cath Results:  normal left main, 80% mid LAD  2.5 x 13 mm CYpher stent to LAD Dr. Rex Kras, subsequent cath showed patent stent in LAD with no sig disease in RCA or Circ;  Peripheral Vascular Procedures:  no previous invasive peripheral vascular procedures.;  LVEF of 52% documented via echocardiogram on 03/20/2413,   ____________________________ CARDIO-PULMONARY TEST DATES EKG Date:  03/09/2017;   Cardiac Cath Date:  01/20/2003;  Nuclear Study Date:  03/22/2013;  Echocardiography Date: 05/09/2016;  Chest Xray Date: 11/27/2014;   ____________________________ FAMILY HISTORY Brother -- Brother alive with problem, Coronary artery bypass grafting, Coronary Artery Disease Father -- Father dead, Myocardial infarction Mother -- Myocardial infarction, Mother dead Sister -- Sister dead, Pacemaker in situ Sister -- Sister dead, Brain disorder Sister -- Sister dead, Cancer ____________________________ SOCIAL HISTORY Alcohol Use:  does not use alcohol;  Smoking:  nonsmoker;  Diet:  regular diet;  Lifestyle:  divorced and 3 children;  Exercise:  some exercise;  Occupation:  retired;  Residence:  lives alone;   ____________________________ REVIEW OF SYSTEMS General:  obesity, malaise and fatigue, weight loss of approximately  5 lbs  Eyes: cataract extraction bilaterally Ears, Nose, Throat, Mouth:  partial hearing loss, hearing aides Respiratory: dyspnea with exertion Cardiovascular:  please review HPI Abdominal: history of GERD and dyspepsiaGenitourinary-Female: recurrent urinary tract infections, hesitancyPsychiatric:  anxiety disorder, depression  ____________________________ PHYSICAL EXAMINATION VITAL SIGNS  Blood Pressure:  100/52 Sitting, Left arm, large cuff  , 100/50 Standing, Left arm and large cuff   Pulse:  100/min. Weight:  187.00 lbs. Height:  64"BMI: 32  Constitutional:  pleasant white female, in no acute distress, moderately obese Skin:  occasional ecchymosis present Head:  normocephalic, normal hair pattern, no masses or tenderness Neck:  supple, without massess. No JVD, thyromegaly or carotid bruits. Carotid upstroke normal. Chest:  normal symmetry, clear to auscultation. Cardiac:  irregularly irregular rhythm, normal S1 and S2, no S3 or S4 Peripheral Pulses:  the femoral,dorsalis pedis, and posterior tibial pulses are full and equal bilaterally with no bruits auscultated. Extremities & Back:  1+ edema Neurological:  no gross motor or sensory deficits noted, affect appropriate, oriented x3. ____________________________ MOST RECENT LIPID PANEL 01/26/17  CHOL TOTL 135 mg/dl, LDL 62 NM, HDL 41 mg/dl and TRIGLYCER 166 mg/dl ____________________________ IMPRESSIONS/PLAN  1. Paroxysmal atrial fibrillation currently out of rhythm with rapid ventricular response 2. Left bundle branch block 3. Hypertensive heart disease 4. Obesity with need to lose weight 5. Chronic diastolic dysfunction  Recommendations:  Twelve-lead EKG personally reviewed by me shows atrial fibrillation with rapid ventricular response. She is very symptomatic at the present time with that. I started her on amiodarone 200 mg twice daily and we'll see her back in 2 weeks with an EKG. She will likely need cardioversion if she fails to  convert to sinus rhythm prior to then. Lab work from recent physician reviewed. Some increase in edema.  Greater than 40 minutes spent was greater than 50%  of the time spent face-to-face. ____________________________ TODAYS ORDERS  1. 12 Lead EKG: Today  2. 12 Lead EKG: 2 weeks  3. Return Visit: 2 weeks                       ____________________________ Cardiology Physician:  Kerry Hough MD [facility]

## 2017-06-18 NOTE — Consult Note (Signed)
Cardiology Consult Note  Admit date: 06/17/2017 Name: [name] 83 y.o.  female DOB:  [birth year] MRN:  [id number]  Today's date:  06/18/2017  Referring Physician:    Dr. Daryll Drown  Primary Physician:  Rachell Cipro  Reason for Consultation:    Atrial fibrillation, congestive heart failure  IMPRESSIONS: 1.  Acute on chronic diastolic heart failure worsened by atrial fibrillation 2.  Persistent atrial fibrillation 3.  Hypertensive heart disease 4.  Left bundle branch block 5.  Chronic kidney disease stage III 6.  Long-term use of anticoagulation 7.  Obesity 8.  Anxiety and depression 9.  Noncompliance 10.  Prior history of CAD with stenting of the LAD in 2003 with no disease in the RCA and circumflex patent at catheterization in 2004  RECOMMENDATION: 1.  Reduce amiodarone to 200 mg daily and check TSH as well as sedimentation rate 2.  Review echocardiogram to evaluate left atrial size 3.  Consider cardioversion during this admission as this was my plan in April until she failed to return for follow-up  4.  Continue to diurese   HISTORY: This 83 year old female is seen at the request of internal medicine for evaluation of atrial fibrillation and heart failure.  The patient has a prior history of coronary artery disease and has a previous stent of the LAD.  She was taking care of a doctor out little and then changed to Dr. Nadyne Coombes and has seen several cardiologists until she switched to me in 2015.  I last saw her in April at which point in time she presented with worsening shortness of breath dyspnea and was found to be in rapid atrial fibrillation.  During that visit she was initiated on amiodarone therapy.  She had previously been on [place]'s without missing doses.  I had planned to see her back in 2 weeks however she canceled that appointment and despite being reminded 3 times by my office never scheduled a follow-up appointment.  She states that her problem started in the early  part of the year which she had the flu and developed fatigue shortness of breath some diarrhea.  She has intermittent diarrhea and nausea ever since she saw me in April and has not felt well and is really not been able to get out of the house.  She developed worsening edema bruising and then developed worsening shortness of breath and orthopnea.  She took a dose of furosemide that she had that was left over from a number of years ago given to her by prior physician.  She evidently lost weight with that but presented yesterday with rapid atrial fibrillation worsening shortness of breath and edema.  She was treated with IV Lasix and I am asked to see her today.  No definite angina that I can tell.  She does have two-pillow orthopnea as well as edema.  Appears to have poor understanding of her medical conditions and treatment.  Past Medical History:  Diagnosis Date  . Anxiety    "some; not treated for it" (11/27/2014)  . Bradycardia   . CAD (coronary artery disease), native coronary artery    2003 normal left main, 80% mid LAD  2.5 x 13 mm Cypher stent to LAD Dr. Rex Kras, subsequent cath 2004 showed patent stent in LAD with no sig disease in RCA or Circ;    . CHF (congestive heart failure) ([place])   . CKD (chronic kidney disease), stage III   . GERD (gastroesophageal reflux disease)   . History of hiatal hernia   .  Hyperlipidemia   . Hypertension   . LBBB (left bundle branch block)   . Migraine    "very rare now" (11/27/2014)  . Obesity (BMI 30-39.9) 06/18/2017  . On home oxygen therapy    "only at night; ? setting" (11/27/2014)  . Osteoarthritis cervical spine   . Sleep apnea 06/18/2017      Past Surgical History:  Procedure Laterality Date  . ABDOMINAL HYSTERECTOMY    . CATARACT EXTRACTION W/ INTRAOCULAR LENS  IMPLANT, BILATERAL Bilateral   . CYSTOCELE REPAIR      Allergies:  is allergic to ampicillin; hydromorphone hcl; and penicillins.   Medications: Prior to Admission medications    Medication Sig Start Date End Date Taking? Authorizing Provider  acetaminophen (TYLENOL) 325 MG tablet Take 2 tablets (650 mg total) by mouth every 6 (six) hours as needed for mild pain (or Fever >/= 101). 12/02/14  Yes Kelvin Cellar, MD  amiodarone (PACERONE) 200 MG tablet Take 200 mg by mouth 2 (two) times daily.   Yes [provider]  apixaban (ELIQUIS) 5 MG TABS tablet Take 1 tablet (5 mg total) by mouth 2 (two) times daily. 12/02/14  Yes Kelvin Cellar, MD  atenolol (TENORMIN) 50 MG tablet Take 50 mg by mouth daily.   Yes [provider]  butalbital-acetaminophen-caffeine (FIORICET, ESGIC) 50-325-40 MG tablet Take 1 tablet by mouth every 4 (four) hours as needed (for headaches).    Yes [provider]  diltiazem (CARDIZEM CD) 180 MG 24 hr capsule Take 180 mg by mouth daily.   Yes [provider]  losartan-hydrochlorothiazide (HYZAAR) 100-25 MG tablet Take 0.5 tablets by mouth daily.    Yes [provider]  omeprazole (PRILOSEC OTC) 20 MG tablet Take 20 mg by mouth daily as needed (for reflux).   Yes [provider]    Family History: Family Status  Relation Status  . Mother Deceased  . Father Deceased    Social History:   reports that she has never smoked. She has never used smokeless tobacco. She reports that she does not drink alcohol or use drugs.   Social History   Social History Narrative   Drinks tea, occasional coffee  lives alone  Review of Systems: She has been obese for several years and has significant malaise and fatigue.  She has had cataract extraction and has significant hearing loss and wears hearing aids.  She has had both diarrhea around 1 time a week as well as nausea esophageal reflux and dyspepsia.  She has recurrent urinary tract infections and has hesitancy.  She has a known anxiety disorder and depression and currently lives alone.  Other than as noted above the remainder of the review of systems is  unremarkable.  Physical Exam: BP (!) 136/52 (BP Location: Right Arm)   Pulse 97   Temp 98.2 F (36.8 C) (Oral)   Resp 18   Ht 5\' 4"  (1.626 m)   Wt 84.4 kg (186 lb) Comment: scale 21-30  SpO2 97%   BMI 31.93 kg/m   General appearance: She is an elderly female mildly anxious difficult historian currently in no acute distress and moderately obese. Head: Normocephalic, without obvious abnormality, atraumatic Eyes: conjunctivae/corneas clear. PERRL, EOM's intact. Fundi not examined Neck: no adenopathy, no carotid bruit, no JVD and supple, symmetrical, trachea midline Lungs: Reduced breath sounds at bases without wheezing Heart: Irregular rate and rhythm, normal S1 and S2, no S3, 2/6 murmur heard at apex Abdomen: soft, non-tender; bowel sounds normal; no masses,  no organomegaly and Moderately obese Pelvic: deferred Extremities: 2+ peripheral edema noted, changes of chronic venous insufficiency noted, normal range of motion no spinal abnormalities Pulses: 1+ peripheral pulses Skin: Ecchymoses noted that are scattered Neurologic: Grossly normal Psych: Alert and oriented x 3 Labs: CBC  Recent Labs  06/17/17 1644  WBC 12.4*  RBC 3.93  HGB 11.5*  HCT 36.4  PLT 284  MCV 92.6  MCH 29.3  MCHC 31.6  RDW 15.6*  LYMPHSABS 2.1  MONOABS 0.7  EOSABS 0.1  BASOSABS 0.0   CMP   Recent Labs  06/18/17 0344  NA 134*  K 4.8  CL 96*  CO2 28  GLUCOSE 105*  BUN 18  CREATININE 1.66*  CALCIUM 9.1  GFRNONAA 27*  GFRAA 32*   BNP (last 3 results) BNP    Component Value Date/Time   BNP 652.8 (H) 06/17/2017 1644   Cardiac Panel (last 3 results)  Recent Labs  06/17/17 2152 06/18/17 0344 06/18/17 1117  TROPONINI <0.03 <0.03 <0.03     Radiology:  Bilateral pleural effusions, atelectasis, cardiomegaly  EKG: IV conduction delay, atrial fibrillation with somewhat rapid response. Independently reviewed by me  Signed:  W. Doristine Church MD [facility]   Cardiology  Consultant  06/18/2017, 1:10 PM

## 2017-06-18 NOTE — Progress Notes (Signed)
  Echocardiogram 2D Echocardiogram has been performed.  [name] 06/18/2017, 3:16 PM

## 2017-06-18 NOTE — Progress Notes (Signed)
PROGRESS NOTE    [name]  NID:[id number] DOB: 03-06-1932 DOA: 06/17/2017 PCP: Fanny Bien, MD   Brief Narrative: 83 y.o. female with medical history significant of OA, migraines, HTN, HLD, GERD, CHF (last TTE in 2016 with preserved EF), Afib on Eliquis and amiodarone, CKD who presents for a month of worsening swelling and SOB.  Patient with mild elevation in BNP, chest x-ray with vascular congestion. EKG with A. fib with RVR on admission. Treated with IV Lasix and admitted for further evaluation.  Assessment & Plan:  # Acute CHF exacerbation likely acute diastolic CHF: -Shortness of breath is better. Continue low-dose IV Lasix. Troponin negative. Follow-up echocardiogram. -Continue current medication including atenolol, losartan -Cardiology consult requested -Strict ins and outs, daily weight  #A. fib with RVR: Patient with history of atrial fibrillation. Heart rate is better controlled. Continue amiodarone, atenolol and diltiazem. On eliquis for anticoagulation.  #Essential hypertension: Blood pressure acceptable. Continue current cardiac medication. Monitor BP.  #Chronic kidney disease is stage III: Mild increase in serum creatinine level today. Continue current medications including diuretics and cardiac medication. Avoid nephrotoxins.  #Asymptomatic bacteriuria: Urine culture ordered. Patient denied urinary symptoms. Holding off on antibiotics.  #Hyperlipidemia: Continue statin.  #Hyponatremia and hypokalemia improved. Monitor BMP.  PT OT evaluation  Active Problems:   Essential hypertension   Atrial fibrillation with RVR (HCC)   Acute exacerbation of CHF (congestive heart failure) (HCC)  DVT prophylaxis: eliquis Code Status: Full code Family Communication: No family at bedside Disposition Plan: Likely discharge home in 1-2 days    Consultants:   Cardiology  Procedures: Echo pending Antimicrobials: None  Subjective: Seen and examined at bedside.  Shortness of breath better. Denied chest pain, nausea vomiting headache or dizziness.  Objective: Vitals:   06/17/17 2154 06/18/17 0209 06/18/17 0557 06/18/17 0900  BP: 131/73 (!) 141/72 130/84 (!) 136/52  Pulse: (!) 102 (!) 118 76 97  Resp: 20  18 18   Temp: 98.1 F (36.7 C) 98 F (36.7 C) (!) 97.5 F (36.4 C) 98.2 F (36.8 C)  TempSrc: Oral Oral Oral Oral  SpO2: 96% 98% 95% 97%  Weight: 86.1 kg (189 lb 12.8 oz) 84.4 kg (186 lb)    Height: 5\' 4"  (1.626 m)       Intake/Output Summary (Last 24 hours) at 06/18/17 1016 Last data filed at 06/18/17 0900  Gross per 24 hour  Intake              240 ml  Output             2050 ml  Net            -1810 ml   Filed Weights   06/17/17 1644 06/17/17 2154 06/18/17 0209  Weight: 88.2 kg (194 lb 7.1 oz) 86.1 kg (189 lb 12.8 oz) 84.4 kg (186 lb)    Examination:  General exam: Appears calm and comfortable  Respiratory system: Bibasal decreased withfinecrackles.Nowheezing Cardiovascular system: S1 & S2 heard, RRR.  Trace bilateral lower extremities edema Gastrointestinal system: Abdomen is nondistended, soft and nontender. Normal bowel sounds heard. Central nervous system: Alert and oriented. No focal neurological deficits. Extremities: Symmetric 5 x 5 power. Skin: No rashes, lesions or ulcers Psychiatry: Judgement and insight appear normal. Mood & affect appropriate.     Data Reviewed: I have personally reviewed following labs and imaging studies  CBC:  Recent Labs Lab 06/17/17 1644  WBC 12.4*  NEUTROABS 9.6*  HGB 11.5*  HCT 36.4  MCV 92.6  PLT 915   Basic Metabolic Panel:  Recent Labs Lab 06/17/17 1644 06/18/17 0344  NA 130* 134*  K 4.9 4.8  CL 95* 96*  CO2 25 28  GLUCOSE 124* 105*  BUN 19 18  CREATININE 1.49* 1.66*  CALCIUM 9.0 9.1   GFR: Estimated Creatinine Clearance: 27 mL/min (A) (by C-G formula based on SCr of 1.66 mg/dL (H)). Liver Function Tests: No results for input(s): AST, ALT, ALKPHOS, BILITOT,  PROT, ALBUMIN in the last 168 hours. No results for input(s): LIPASE, AMYLASE in the last 168 hours. No results for input(s): AMMONIA in the last 168 hours. Coagulation Profile: No results for input(s): INR, PROTIME in the last 168 hours. Cardiac Enzymes:  Recent Labs Lab 06/17/17 1644 06/17/17 2152 06/18/17 0344  TROPONINI <0.03 <0.03 <0.03   BNP (last 3 results) No results for input(s): PROBNP in the last 8760 hours. HbA1C: No results for input(s): HGBA1C in the last 72 hours. CBG: No results for input(s): GLUCAP in the last 168 hours. Lipid Profile: No results for input(s): CHOL, HDL, LDLCALC, TRIG, CHOLHDL, LDLDIRECT in the last 72 hours. Thyroid Function Tests: No results for input(s): TSH, T4TOTAL, FREET4, T3FREE, THYROIDAB in the last 72 hours. Anemia Panel: No results for input(s): VITAMINB12, FOLATE, FERRITIN, TIBC, IRON, RETICCTPCT in the last 72 hours. Sepsis Labs: No results for input(s): PROCALCITON, LATICACIDVEN in the last 168 hours.  No results found for this or any previous visit (from the past 240 hour(s)).       Radiology Studies: Dg Chest 2 View  Result Date: 06/17/2017 CLINICAL DATA:  Shortness of breath EXAM: CHEST  2 VIEW COMPARISON:  11/27/2014 FINDINGS: AP and lateral views of the chest show low volumes. Stable asymmetric elevation right hemidiaphragm. Cardiopericardial silhouette is at upper limits of normal for size. There is pulmonary vascular congestion without overt pulmonary edema. Probable atelectasis at the bases. Small bilateral pleural effusions are evident. IMPRESSION: Low volume film with vascular congestion and small bilateral pleural effusions. Bibasilar atelectasis. Electronically Signed   By: Misty Stanley M.D.   On: 06/17/2017 17:37        Scheduled Meds: . amiodarone  200 mg Oral Daily  . apixaban  5 mg Oral BID  . aspirin EC  81 mg Oral Daily  . atenolol  50 mg Oral Daily  . atorvastatin  80 mg Oral Daily  . diltiazem  180  mg Oral Daily  . furosemide  20 mg Intravenous BID  . losartan  50 mg Oral Daily  . sodium chloride flush  3 mL Intravenous Q12H   Continuous Infusions: . sodium chloride       LOS: 1 day    Sonda Coppens Tanna Furry, MD [facility] Pager [phone]  If 7PM-7AM, please contact night-coverage [email or web address] Password [facility] 06/18/2017, 10:16 AM

## 2017-06-19 ENCOUNTER — Inpatient Hospital Stay (HOSPITAL_COMMUNITY): Payer: Medicare Other

## 2017-06-19 DIAGNOSIS — E875 Hyperkalemia: Secondary | ICD-10-CM

## 2017-06-19 DIAGNOSIS — I4891 Unspecified atrial fibrillation: Secondary | ICD-10-CM

## 2017-06-19 DIAGNOSIS — I5023 Acute on chronic systolic (congestive) heart failure: Secondary | ICD-10-CM

## 2017-06-19 LAB — BASIC METABOLIC PANEL
Anion gap: 10 (ref 5–15)
BUN: 25 mg/dL — AB (ref 6–20)
CALCIUM: 8.5 mg/dL — AB (ref 8.9–10.3)
CO2: 25 mmol/L (ref 22–32)
Chloride: 96 mmol/L — ABNORMAL LOW (ref 101–111)
Creatinine, Ser: 1.71 mg/dL — ABNORMAL HIGH (ref 0.44–1.00)
GFR calc Af Amer: 31 mL/min — ABNORMAL LOW (ref >60)
GFR, EST NON AFRICAN AMERICAN: 26 mL/min — AB (ref >60)
GLUCOSE: 106 mg/dL — AB (ref 65–99)
POTASSIUM: 5.7 mmol/L — AB (ref 3.5–5.1)
Sodium: 131 mmol/L — ABNORMAL LOW (ref 135–145)

## 2017-06-19 LAB — URINALYSIS, ROUTINE W REFLEX MICROSCOPIC
Bilirubin Urine: NEGATIVE
Glucose, UA: NEGATIVE mg/dL
Hgb urine dipstick: NEGATIVE
KETONES UR: NEGATIVE mg/dL
LEUKOCYTES UA: NEGATIVE
NITRITE: NEGATIVE
PROTEIN: NEGATIVE mg/dL
Specific Gravity, Urine: 1.011 (ref 1.005–1.030)
pH: 7 (ref 5.0–8.0)

## 2017-06-19 LAB — URINE CULTURE

## 2017-06-19 LAB — TSH: TSH: 138.351 u[IU]/mL — ABNORMAL HIGH (ref 0.350–4.500)

## 2017-06-19 LAB — SEDIMENTATION RATE: Sed Rate: 16 mm/hr (ref 0–22)

## 2017-06-19 MED ORDER — APIXABAN 2.5 MG PO TABS
2.5000 mg | ORAL_TABLET | Freq: Two times a day (BID) | ORAL | Status: DC
  Administered 2017-06-19 – 2017-06-21 (×4): 2.5 mg via ORAL
  Filled 2017-06-19 (×3): qty 1

## 2017-06-19 MED ORDER — LEVOTHYROXINE SODIUM 50 MCG PO TABS
50.0000 ug | ORAL_TABLET | Freq: Every day | ORAL | Status: DC
  Administered 2017-06-20: 50 ug via ORAL
  Filled 2017-06-19: qty 1

## 2017-06-19 MED ORDER — SODIUM POLYSTYRENE SULFONATE 15 GM/60ML PO SUSP
30.0000 g | Freq: Once | ORAL | Status: AC
  Administered 2017-06-19: 30 g via ORAL
  Filled 2017-06-19: qty 120

## 2017-06-19 NOTE — Progress Notes (Signed)
Subjective:  C/o easy bruising.  Not SOB.  ECHO with mild to mod LAE, LBBB, mild reduced LV  Objective:  Vital Signs in the last 24 hours: BP 119/71 (BP Location: Right Arm)   Pulse 77   Temp 98.2 F (36.8 C) (Oral)   Resp 18   Ht 5\' 4"  (1.626 m)   Wt 83.6 kg (184 lb 6.4 oz)   SpO2 98%   BMI 31.65 kg/m   Physical Exam: Elderly WF in NAD Lungs:  Clear  Cardiac: irregular rhythm, normal S1 and S2, no S3 Abdomen:  Soft, nontender, no masses Extremities:  1-2+ edema present  Intake/Output from previous day: 07/22 0701 - 07/23 0700 In: 600 [P.O.:600] Out: 2300 [Urine:2300] Weight Filed Weights   06/17/17 2154 06/18/17 0209 06/19/17 0500  Weight: 86.1 kg (189 lb 12.8 oz) 84.4 kg (186 lb) 83.6 kg (184 lb 6.4 oz)    Lab Results: Basic Metabolic Panel:  Recent Labs  06/18/17 0344 06/19/17 0357  NA 134* 131*  K 4.8 5.7*  CL 96* 96*  CO2 28 25  GLUCOSE 105* 106*  BUN 18 25*  CREATININE 1.66* 1.71*    CBC:  Recent Labs  06/17/17 1644  WBC 12.4*  NEUTROABS 9.6*  HGB 11.5*  HCT 36.4  MCV 92.6  PLT 284    BNP    Component Value Date/Time   BNP 652.8 (H) 06/17/2017 1644    Cardiac Panel (last 3 results)  Recent Labs  06/17/17 2152 06/18/17 0344 06/18/17 1117  TROPONINI <0.03 <0.03 <0.03   Telemetry: atrial fibrillation  Assessment/Plan:  1. Persistent atrial fibrillation 2. Hyperkalemia 3. Worsening renal failureon top of chronic  Rec:  Unable to schedule cardioversion today.  Need to hold ACE and would hold lasix for now and treat hyperkalemia.      Kerry Hough  MD [facility] Cardiology  06/19/2017, 9:39 AM

## 2017-06-19 NOTE — Progress Notes (Signed)
PROGRESS NOTE    Mikayla WHITELAW  KXF:[id number] DOB: Sep 27, 1932 DOA: 06/17/2017 PCP: Fanny Bien, MD   Brief Narrative: 83 y.o. female with medical history significant of OA, migraines, HTN, HLD, GERD, CHF (last TTE in 2016 with preserved EF), Afib on Eliquis and amiodarone, CKD who presents for a month of worsening swelling and SOB.  Patient with mild elevation in BNP, chest x-ray with vascular congestion. EKG with A. fib with RVR on admission. Treated with IV Lasix and admitted for further evaluation.  Assessment & Plan:  # Acute systolic CHF: -Shortness of breath is better. Echo with mildly reduced systolic function with EF of 40-45%. IV Lasix held by cardiologist today. Patient denied chest pain, shortness of breath. Cardiology consult appreciated. Troponin negative.  -Continue current medication including atenolol. Losartan on hold today because of mild hyperkalemia -Strict ins and outs, daily weight  #A. fib with RVR: Patient with history of atrial fibrillation. Heart rate is better controlled. Continue amiodarone, atenolol and diltiazem. On eliquis for anticoagulation. Plan for cardioversion as per cardiologist.  #Essential hypertension: Blood pressure acceptable. Continue current cardiac medication. Monitor BP.  #Acute kidney injury on Chronic kidney disease is stage III/ mild hyperkalemia: Mild increase in serum creatinine level today.  -Holding Lasix and losartan today. Treated with a dose of Kayexalate. -Check UA, ultrasound kidneys -Monitor BMP and electrolytes.   #Asymptomatic bacteriuria: Urine culture pending. Patient denied urinary symptoms. Holding off on antibiotics.  #Hyperlipidemia: Continue statin.  #Hyponatremia and hypokalemia improved. Monitor BMP.  PT OT evaluation  Active Problems:   Hypertensive heart disease without CHF   Persistent atrial fibrillation (HCC)   Acute exacerbation of CHF (congestive heart failure) (HCC)   CKD (chronic kidney  disease), stage III   Current use of long term anticoagulation   Sleep apnea   Obesity (BMI 30-39.9)   LBBB (left bundle branch block)  DVT prophylaxis: eliquis Code Status: Full code Family Communication: No family at bedside Disposition Plan: Likely discharge home in 1-2 days    Consultants:   Cardiology  Procedures: Echo  Antimicrobials: None  Subjective: Seen and examined at bedside. No chest pain, shortness of breath, nausea vomiting or headache.  Objective: Vitals:   06/18/17 1329 06/18/17 1952 06/19/17 0030 06/19/17 0500  BP: (!) 118/53 (!) 115/51 136/61 119/71  Pulse: 76 83 70 77  Resp: 18 18 18 18   Temp: 98.4 F (36.9 C) 98.2 F (36.8 C) 98 F (36.7 C) 98.2 F (36.8 C)  TempSrc: Oral Oral Oral Oral  SpO2: 97% 97% 97% 98%  Weight:    83.6 kg (184 lb 6.4 oz)  Height:        Intake/Output Summary (Last 24 hours) at 06/19/17 1101 Last data filed at 06/19/17 0600  Gross per 24 hour  Intake              360 ml  Output             1800 ml  Net            -1440 ml   Filed Weights   06/17/17 2154 06/18/17 0209 06/19/17 0500  Weight: 86.1 kg (189 lb 12.8 oz) 84.4 kg (186 lb) 83.6 kg (184 lb 6.4 oz)    Examination:  General exam: Not in distress, lying flat on bed  Respiratory system: Clear bilaterally, no wheezing Cardiovascular system: Irregularly irregular, S1-S2 normal. No lower extremity edema Gastrointestinal system: Abdomen is nondistended, soft and nontender. Normal bowel sounds heard. Central nervous  system: Alert and oriented. No focal neurological deficits. Skin: No rashes, lesions or ulcers Psychiatry: Judgement and insight appear normal. Mood & affect appropriate.     Data Reviewed: I have personally reviewed following labs and imaging studies  CBC:  Recent Labs Lab 06/17/17 1644  WBC 12.4*  NEUTROABS 9.6*  HGB 11.5*  HCT 36.4  MCV 92.6  PLT 116   Basic Metabolic Panel:  Recent Labs Lab 06/17/17 1644 06/18/17 0344  06/19/17 0357  NA 130* 134* 131*  K 4.9 4.8 5.7*  CL 95* 96* 96*  CO2 25 28 25   GLUCOSE 124* 105* 106*  BUN 19 18 25*  CREATININE 1.49* 1.66* 1.71*  CALCIUM 9.0 9.1 8.5*   GFR: Estimated Creatinine Clearance: 26.1 mL/min (A) (by C-G formula based on SCr of 1.71 mg/dL (H)). Liver Function Tests: No results for input(s): AST, ALT, ALKPHOS, BILITOT, PROT, ALBUMIN in the last 168 hours. No results for input(s): LIPASE, AMYLASE in the last 168 hours. No results for input(s): AMMONIA in the last 168 hours. Coagulation Profile: No results for input(s): INR, PROTIME in the last 168 hours. Cardiac Enzymes:  Recent Labs Lab 06/17/17 1644 06/17/17 2152 06/18/17 0344 06/18/17 1117  TROPONINI <0.03 <0.03 <0.03 <0.03   BNP (last 3 results) No results for input(s): PROBNP in the last 8760 hours. HbA1C: No results for input(s): HGBA1C in the last 72 hours. CBG: No results for input(s): GLUCAP in the last 168 hours. Lipid Profile: No results for input(s): CHOL, HDL, LDLCALC, TRIG, CHOLHDL, LDLDIRECT in the last 72 hours. Thyroid Function Tests:  Recent Labs  06/19/17 0805  TSH 138.351*   Anemia Panel: No results for input(s): VITAMINB12, FOLATE, FERRITIN, TIBC, IRON, RETICCTPCT in the last 72 hours. Sepsis Labs: No results for input(s): PROCALCITON, LATICACIDVEN in the last 168 hours.  No results found for this or any previous visit (from the past 240 hour(s)).       Radiology Studies: Dg Chest 2 View  Result Date: 06/17/2017 CLINICAL DATA:  Shortness of breath EXAM: CHEST  2 VIEW COMPARISON:  11/27/2014 FINDINGS: AP and lateral views of the chest show low volumes. Stable asymmetric elevation right hemidiaphragm. Cardiopericardial silhouette is at upper limits of normal for size. There is pulmonary vascular congestion without overt pulmonary edema. Probable atelectasis at the bases. Small bilateral pleural effusions are evident. IMPRESSION: Low volume film with vascular  congestion and small bilateral pleural effusions. Bibasilar atelectasis. Electronically Signed   By: Misty Stanley M.D.   On: 06/17/2017 17:37        Scheduled Meds: . amiodarone  200 mg Oral Daily  . apixaban  5 mg Oral BID  . aspirin EC  81 mg Oral Daily  . atenolol  50 mg Oral Daily  . atorvastatin  80 mg Oral Daily  . diltiazem  180 mg Oral Daily  . sodium chloride flush  3 mL Intravenous Q12H   Continuous Infusions: . sodium chloride       LOS: 2 days    Warrene Kapfer Tanna Furry, MD [facility] Pager [phone]  If 7PM-7AM, please contact night-coverage [email or web address] Password [email or web address] 06/19/2017, 11:01 AM

## 2017-06-19 NOTE — Progress Notes (Signed)
Patient remains in atrial fibrillation.  Lab work returns with profound hypothyroidism with TSH 138.  I was planning to do cardioversion tomorrow but I am going to cancel this until we have had a chance to replace her thyroid hormone.  Likely the hypothyroidism may be due to amiodarone but she may have had some pre-existing hypothyroidism also.  Kerry Hough MD [facility]

## 2017-06-19 NOTE — Progress Notes (Signed)
ANTICOAGULATION CONSULT NOTE - Initial Consult  Pharmacy Consult for Eliquis Indication: atrial fibrillation  Allergies  Allergen Reactions  . Ampicillin Shortness Of Breath, Swelling and Rash  . Hydromorphone Hcl Shortness Of Breath, Itching, Swelling and Rash  . Penicillins Anaphylaxis    Has patient had a PCN reaction causing immediate rash, facial/tongue/throat swelling, SOB or lightheadedness with hypotension: Yes Has patient had a PCN reaction causing severe rash involving mucus membranes or skin necrosis: Unknown Has patient had a PCN reaction that required hospitalization: Yes (was in the hosp) Has patient had a PCN reaction occurring within the last 10 years: No If all of the above answers are "NO", then may proceed with Cephalosporin use.     Patient Measurements: Height: 5\' 4"  (162.6 cm) Weight: 184 lb 6.4 oz (83.6 kg) IBW/kg (Calculated) : 54.7 Heparin Dosing Weight: n/a  Vital Signs: Temp: 98.4 F (36.9 C) (07/23 1215) Temp Source: Oral (07/23 1215) BP: 124/78 (07/23 1215) Pulse Rate: 81 (07/23 1215)  Labs:  Recent Labs  06/17/17 1644 06/17/17 2152 06/18/17 0344 06/18/17 1117 06/19/17 0357  HGB 11.5*  --   --   --   --   HCT 36.4  --   --   --   --   PLT 284  --   --   --   --   CREATININE 1.49*  --  1.66*  --  1.71*  TROPONINI <0.03 <0.03 <0.03 <0.03  --     Estimated Creatinine Clearance: 26.1 mL/min (A) (by C-G formula based on SCr of 1.71 mg/dL (H)).   Medical History: Past Medical History:  Diagnosis Date  . Anxiety    "some; not treated for it" (11/27/2014)  . Bradycardia   . CAD (coronary artery disease), native coronary artery    2003 normal left main, 80% mid LAD  2.5 x 13 mm Cypher stent to LAD Dr. Rex Kras, subsequent cath 2004 showed patent stent in LAD with no sig disease in RCA or Circ;    . CHF (congestive heart failure) ([place])   . CKD (chronic kidney disease), stage III   . GERD (gastroesophageal reflux disease)   . History of  hiatal hernia   . Hyperlipidemia   . Hypertension   . LBBB (left bundle branch block)   . Migraine    "very rare now" (11/27/2014)  . Obesity (BMI 30-39.9) 06/18/2017  . On home oxygen therapy    "only at night; ? setting" (11/27/2014)  . Osteoarthritis cervical spine   . Sleep apnea 06/18/2017     Assessment: 83 yo female with atrial fibrillation.  On Eliquis 5 mg BID prior to admission.  Now with Scr > 1.5, and dose reduced to 2.5 mg BID by Dr. Wynonia Lawman.  Goal of Therapy:  Monitor platelets by anticoagulation protocol: Yes   Plan:  1.Continue Eliquis 2.5 mg BID. 2. F/u plans for cardioversion.  Uvaldo Rising, BCPS  Clinical Pharmacist Pager [phone]  06/19/2017 7:38 PM

## 2017-06-20 ENCOUNTER — Encounter (HOSPITAL_COMMUNITY)
Admission: EM | Disposition: A | Discharge status: Home / Self Care | Payer: Self-pay | Source: Home / Self Care | Attending: Nephrology

## 2017-06-20 DIAGNOSIS — I482 Chronic atrial fibrillation, unspecified: Secondary | ICD-10-CM

## 2017-06-20 LAB — BASIC METABOLIC PANEL
Anion gap: 9 (ref 5–15)
BUN: 27 mg/dL — AB (ref 6–20)
CO2: 27 mmol/L (ref 22–32)
CREATININE: 1.8 mg/dL — AB (ref 0.44–1.00)
Calcium: 8.3 mg/dL — ABNORMAL LOW (ref 8.9–10.3)
Chloride: 96 mmol/L — ABNORMAL LOW (ref 101–111)
GFR calc Af Amer: 29 mL/min — ABNORMAL LOW (ref >60)
GFR, EST NON AFRICAN AMERICAN: 25 mL/min — AB (ref >60)
GLUCOSE: 115 mg/dL — AB (ref 65–99)
Potassium: 3.7 mmol/L (ref 3.5–5.1)
SODIUM: 132 mmol/L — AB (ref 135–145)

## 2017-06-20 LAB — T4, FREE: FREE T4: 0.42 ng/dL — AB (ref 0.61–1.12)

## 2017-06-20 SURGERY — CARDIOVERSION
Anesthesia: General

## 2017-06-20 MED ORDER — LEVOTHYROXINE SODIUM 100 MCG IV SOLR
100.0000 ug | Freq: Every day | INTRAVENOUS | Status: DC
  Administered 2017-06-20 – 2017-06-21 (×2): 100 ug via INTRAVENOUS
  Filled 2017-06-20 (×2): qty 5

## 2017-06-20 MED ORDER — POLYETHYLENE GLYCOL 3350 17 G PO PACK
17.0000 g | PACK | Freq: Every day | ORAL | Status: DC
  Administered 2017-06-20 – 2017-06-21 (×2): 17 g via ORAL
  Filled 2017-06-20 (×2): qty 1

## 2017-06-20 MED ORDER — SENNOSIDES-DOCUSATE SODIUM 8.6-50 MG PO TABS
1.0000 | ORAL_TABLET | Freq: Two times a day (BID) | ORAL | Status: DC
  Administered 2017-06-20 – 2017-06-21 (×3): 1 via ORAL
  Filled 2017-06-20 (×3): qty 1

## 2017-06-20 MED ORDER — LACTULOSE 10 GM/15ML PO SOLN
20.0000 g | Freq: Two times a day (BID) | ORAL | Status: AC
  Administered 2017-06-20 (×2): 20 g via ORAL
  Filled 2017-06-20 (×2): qty 30

## 2017-06-20 MED ORDER — DIPHENHYDRAMINE HCL 12.5 MG/5ML PO ELIX
12.5000 mg | ORAL_SOLUTION | Freq: Every evening | ORAL | Status: DC | PRN
  Administered 2017-06-20: 12.5 mg via ORAL
  Filled 2017-06-20: qty 10

## 2017-06-20 NOTE — Progress Notes (Addendum)
PROGRESS NOTE    [name]  NWG:[id number] DOB: [birth year] DOA: 06/17/2017 PCP: Fanny Bien, MD   Brief Narrative: 83 y.o. female with medical history significant of OA, migraines, HTN, HLD, GERD, CHF (last TTE in 2016 with preserved EF), Afib on Eliquis and amiodarone, CKD who presents for a month of worsening swelling and SOB.  Patient with mild elevation in BNP, chest x-ray with vascular congestion. EKG with A. fib with RVR on admission. Treated with IV Lasix and admitted for further evaluation.  Assessment & Plan:  # Acute systolic CHF: -Shortness of breath is better. Echo with mildly reduced systolic function with EF of 40-45%.  Patient denied chest pain, shortness of breath. Cardiology consult appreciated. Troponin negative.  -Continue current medication including atenolol. Losartan on hold because of mild elevation in serum creatinine level. Potassium normalized. -Strict ins and outs, daily weight  #A. fib with RVR: Patient with history of atrial fibrillation. Heart rate is better controlled. Continue amiodarone, atenolol and diltiazem. On eliquis for anticoagulation. Plan for cardioversion as per cardiologist. May do as outpatient.  #Essential hypertension: Blood pressure acceptable. Continue current cardiac medication. Monitor BP.  #Acute kidney injury on Chronic kidney disease is stage III/ mild hyperkalemia:  -Mild elevation in serum creatinine level today. Continue to hold Lasix and losartan. Potassium level and acceptable today. Ultrasound of kidneys with no hydronephrosis. Repeat BMP in the morning. -UA unremarkable.  #Asymptomatic bacteriuria: Urine culture with insignificant growth. Patient denied urinary symptoms. Holding off on antibiotics.  #Hyperlipidemia: Continue statin.  #Hyponatremia and hypokalemia improved. Monitor BMP.  #Severe hypothyroidism: Patient with TSH of 138. Taken off Synthroid by her daughter. Patient with clinical symptoms of  generalized weakness fatigue, constipation and not feeling well. Started on Synthroid IV 100 g today. Discussed with pharmacist. May consider discharging home with oral Synthroid with outpatient follow-up to monitor her thyroid function tests.  #Generalized weakness and fatigue: Patient not feeling well today. Repleting thyroid hormone. PT OT evaluation.  Active Problems:   Hypertensive heart disease without CHF   Persistent atrial fibrillation (HCC)   Acute exacerbation of CHF (congestive heart failure) (HCC)   CKD (chronic kidney disease), stage III   Current use of long term anticoagulation   Sleep apnea   Obesity (BMI 30-39.9)   LBBB (left bundle branch block)   Atrial fibrillation with RVR (HCC)   Hyperkalemia  DVT prophylaxis: eliquis Code Status: Full code Family Communication: No family at bedside Disposition Plan: Likely discharge home in 1-2 days    Consultants:   Cardiology  Procedures: Echo  Antimicrobials: None  Subjective: Seen and examined at bedside. He feels weak and tired, constipated. Denied headache chest pain or shortness of breath.  Objective: Vitals:   06/19/17 2010 06/20/17 0517 06/20/17 0740 06/20/17 1146  BP: 125/63 (!) 150/78 137/80 132/67  Pulse: 86 89 79 83  Resp: 18 18 18 18   Temp: 98.3 F (36.8 C) 98.3 F (36.8 C) 97.8 F (36.6 C) 98.1 F (36.7 C)  TempSrc: Oral Oral Oral Oral  SpO2: 97% 97% 99% 96%  Weight:  84.5 kg (186 lb 4.8 oz)    Height:        Intake/Output Summary (Last 24 hours) at 06/20/17 1247 Last data filed at 06/20/17 1131  Gross per 24 hour  Intake              893 ml  Output             2300 ml  Net            -  1407 ml   Filed Weights   06/18/17 0209 06/19/17 0500 06/20/17 0517  Weight: 84.4 kg (186 lb) 83.6 kg (184 lb 6.4 oz) 84.5 kg (186 lb 4.8 oz)    Examination:  General exam: Not in distress Respiratory system: Clear bilateral, no wheezing Cardiovascular system: Irregular heart rate, S1-S2 normal.  No lower extremity edema. Gastrointestinal system: Abdomen soft, nontender nondistended. Bowel sound positive. Central nervous system: Alert and oriented. No focal neurological deficits. Skin: No rashes, lesions or ulcers Psychiatry: Judgement and insight appear normal. Mood & affect appropriate.     Data Reviewed: I have personally reviewed following labs and imaging studies  CBC:  Recent Labs Lab 06/17/17 1644  WBC 12.4*  NEUTROABS 9.6*  HGB 11.5*  HCT 36.4  MCV 92.6  PLT 332   Basic Metabolic Panel:  Recent Labs Lab 06/17/17 1644 06/18/17 0344 06/19/17 0357 06/20/17 0325  NA 130* 134* 131* 132*  K 4.9 4.8 5.7* 3.7  CL 95* 96* 96* 96*  CO2 25 28 25 27   GLUCOSE 124* 105* 106* 115*  BUN 19 18 25* 27*  CREATININE 1.49* 1.66* 1.71* 1.80*  CALCIUM 9.0 9.1 8.5* 8.3*   GFR: Estimated Creatinine Clearance: 24.9 mL/min (A) (by C-G formula based on SCr of 1.8 mg/dL (H)). Liver Function Tests: No results for input(s): AST, ALT, ALKPHOS, BILITOT, PROT, ALBUMIN in the last 168 hours. No results for input(s): LIPASE, AMYLASE in the last 168 hours. No results for input(s): AMMONIA in the last 168 hours. Coagulation Profile: No results for input(s): INR, PROTIME in the last 168 hours. Cardiac Enzymes:  Recent Labs Lab 06/17/17 1644 06/17/17 2152 06/18/17 0344 06/18/17 1117  TROPONINI <0.03 <0.03 <0.03 <0.03   BNP (last 3 results) No results for input(s): PROBNP in the last 8760 hours. HbA1C: No results for input(s): HGBA1C in the last 72 hours. CBG: No results for input(s): GLUCAP in the last 168 hours. Lipid Profile: No results for input(s): CHOL, HDL, LDLCALC, TRIG, CHOLHDL, LDLDIRECT in the last 72 hours. Thyroid Function Tests:  Recent Labs  06/19/17 0805 06/20/17 0921  TSH 138.351*  --   FREET4  --  0.42*   Anemia Panel: No results for input(s): VITAMINB12, FOLATE, FERRITIN, TIBC, IRON, RETICCTPCT in the last 72 hours. Sepsis Labs: No results for  input(s): PROCALCITON, LATICACIDVEN in the last 168 hours.  Recent Results (from the past 240 hour(s))  Culture, Urine     Status: Abnormal   Collection Time: 06/18/17  8:30 AM  Result Value Ref Range Status   Specimen Description URINE, RANDOM  Final   Special Requests NONE  Final   Culture <10,000 COLONIES/mL INSIGNIFICANT GROWTH (A)  Final   Report Status 06/19/2017 FINAL  Final         Radiology Studies: US Renal  Result Date: 06/19/2017 CLINICAL DATA:  83 year old hypertensive female with renal failure. Initial encounter. EXAM: RENAL / URINARY TRACT ULTRASOUND COMPLETE COMPARISON:  None. FINDINGS: Right Kidney: Length: 9.2 cm. Echogenicity within normal limits. Slightly lobulated contour. No mass or hydronephrosis visualized. Left Kidney: Length: 10.2 cm. Echogenicity within normal limits. Slightly lobulated contour. No mass or hydronephrosis visualized. Bladder: Appears normal for degree of bladder distention. IMPRESSION: No hydronephrosis. Slightly lobulated renal contour bilaterally without mass identified. Electronically Signed   By: Genia Del M.D.   On: 06/19/2017 12:20        Scheduled Meds: . amiodarone  200 mg Oral Daily  . apixaban  2.5 mg Oral BID  . atenolol  50 mg Oral Daily  . atorvastatin  80 mg Oral Daily  . diltiazem  180 mg Oral Daily  . levothyroxine  100 mcg Intravenous Daily  . sodium chloride flush  3 mL Intravenous Q12H   Continuous Infusions: . sodium chloride       LOS: 3 days    Mikayla Cunliffe Tanna Furry, MD [facility] Pager [phone]  If 7PM-7AM, please contact night-coverage [email or web address] Password [facility] 06/20/2017, 12:47 PM

## 2017-06-20 NOTE — Progress Notes (Signed)
Educated patient on using the call bell for assistance. Also educated the patient on the reason for the bed alarm.    Virl Diamond, Student-RN

## 2017-06-20 NOTE — Progress Notes (Signed)
Subjective:  She is not having any shortness of breath this morning.  No chest pain.  Remains in atrial fibrillation overnight.  Potassium is improved today.  Some increasing creatinine overnight.  She does have a previous history of having been treated for hypothyroidism number of years ago but evidently a previous endocrinologist took her off of it.  She has only been on amiodarone out 3 months and it is unlikely that she would've developed this degree of found hypothyroidism with TSH 138 in that time.  Because of the nature of it it's best course to postpone anesthesia and cardioversion until we have had a chance to replete her thyroid hormone.  Her creatinine is up some and her eloquent since been adjusted to allow for her age and her renal function.    BP 137/80 (BP Location: Left Arm)   Pulse 79   Temp 97.8 F (36.6 C) (Oral)   Resp 18   Ht 5\' 4"  (1.626 m)   Wt 84.5 kg (186 lb 4.8 oz)   SpO2 99%   BMI 31.98 kg/m    Physical Exam: Elderly WF in NAD Lungs:  Clear  Cardiac: irregular rhythm, normal S1 and S2, no S3 Abdomen:  Soft, nontender, no masses Extremities:  1-2+ edema present  Intake/Output from previous day: 07/23 0701 - 07/24 0700 In: 770 [P.O.:770] Out: 1550 [Urine:1550] Weight Filed Weights   06/18/17 0209 06/19/17 0500 06/20/17 0517  Weight: 84.4 kg (186 lb) 83.6 kg (184 lb 6.4 oz) 84.5 kg (186 lb 4.8 oz)    Lab Results: Basic Metabolic Panel:  Recent Labs  06/19/17 0357 06/20/17 0325  NA 131* 132*  K 5.7* 3.7  CL 96* 96*  CO2 25 27  GLUCOSE 106* 115*  BUN 25* 27*  CREATININE 1.71* 1.80*    CBC:  Recent Labs  06/17/17 1644  WBC 12.4*  NEUTROABS 9.6*  HGB 11.5*  HCT 36.4  MCV 92.6  PLT 284    BNP    Component Value Date/Time   BNP 652.8 (H) 06/17/2017 1644    Cardiac Panel (last 3 results)  Recent Labs  06/17/17 2152 06/18/17 0344 06/18/17 1117  TROPONINI <0.03 <0.03 <0.03   Telemetry: atrial fibrillation-personally  reviewed  Assessment/Plan:  1. Persistent atrial fibrillation 2. Hyperkalemia-resolved 3. Worsening renal failure on top of chronic 4.  Profound hypothyroidism.    Rec:  Synthroid was started yesterday and will need to start at a lower dose and titrate up and follow as an outpatient.  Once she has been repleted we can consider cardioversion to see if it will help her symptomatically.  From a cardiovascular viewpoint would be acceptable to go home in stable from her other medical viewpoint.  I would need to see her in follow-up in 2 weeks.Kerry Hough  MD [facility]  06/20/2017, 8:49 AM

## 2017-06-21 DIAGNOSIS — I5021 Acute systolic (congestive) heart failure: Secondary | ICD-10-CM

## 2017-06-21 DIAGNOSIS — I447 Left bundle-branch block, unspecified: Secondary | ICD-10-CM

## 2017-06-21 DIAGNOSIS — I4891 Unspecified atrial fibrillation: Secondary | ICD-10-CM

## 2017-06-21 DIAGNOSIS — I482 Chronic atrial fibrillation: Secondary | ICD-10-CM

## 2017-06-21 DIAGNOSIS — Z7901 Long term (current) use of anticoagulants: Secondary | ICD-10-CM

## 2017-06-21 DIAGNOSIS — N183 Chronic kidney disease, stage 3 (moderate): Secondary | ICD-10-CM

## 2017-06-21 DIAGNOSIS — E875 Hyperkalemia: Secondary | ICD-10-CM

## 2017-06-21 DIAGNOSIS — E871 Hypo-osmolality and hyponatremia: Secondary | ICD-10-CM

## 2017-06-21 LAB — BASIC METABOLIC PANEL
ANION GAP: 6 (ref 5–15)
BUN: 23 mg/dL — ABNORMAL HIGH (ref 6–20)
CALCIUM: 8.7 mg/dL — AB (ref 8.9–10.3)
CO2: 31 mmol/L (ref 22–32)
Chloride: 99 mmol/L — ABNORMAL LOW (ref 101–111)
Creatinine, Ser: 1.63 mg/dL — ABNORMAL HIGH (ref 0.44–1.00)
GFR calc Af Amer: 33 mL/min — ABNORMAL LOW (ref >60)
GFR calc non Af Amer: 28 mL/min — ABNORMAL LOW (ref >60)
GLUCOSE: 115 mg/dL — AB (ref 65–99)
POTASSIUM: 3.9 mmol/L (ref 3.5–5.1)
Sodium: 136 mmol/L (ref 135–145)

## 2017-06-21 LAB — T3, FREE: T3, Free: 1.2 pg/mL — ABNORMAL LOW (ref 2.0–4.4)

## 2017-06-21 MED ORDER — SENNOSIDES-DOCUSATE SODIUM 8.6-50 MG PO TABS
1.0000 | ORAL_TABLET | Freq: Every day | ORAL | 0 refills | Status: DC
Start: 2017-06-21 — End: 2018-10-03

## 2017-06-21 MED ORDER — LEVOTHYROXINE SODIUM 50 MCG PO TABS: 50.0000 ug | ORAL_TABLET | Freq: Every day | ORAL | 0 refills | Status: DC

## 2017-06-21 MED ORDER — AMIODARONE HCL 200 MG PO TABS: 200.0000 mg | ORAL_TABLET | Freq: Every day | ORAL | 0 refills | Status: DC

## 2017-06-21 MED ORDER — ATORVASTATIN CALCIUM 80 MG PO TABS
80.0000 mg | ORAL_TABLET | Freq: Every day | ORAL | 0 refills | Status: AC
Start: 2017-06-22 — End: ?

## 2017-06-21 MED ORDER — FUROSEMIDE 20 MG PO TABS: 20.0000 mg | ORAL_TABLET | ORAL | 0 refills | Status: DC

## 2017-06-21 MED ORDER — POLYETHYLENE GLYCOL 3350 17 G PO PACK: 17.0000 g | PACK | Freq: Every day | ORAL | 0 refills | Status: DC

## 2017-06-21 NOTE — Progress Notes (Signed)
Pt has had a restful night with prn given at bedtime, no BM over night however potassium had decrease with labs from yesterday.

## 2017-06-21 NOTE — Evaluation (Signed)
Occupational Therapy Evaluation and Discharge Patient Details Name: [name] MRN: [id number] DOB: 04-06-1932 Today's Date: 06/21/2017    History of Present Illness [name] is a 83 y.o. female with medical history sigificant of OA, migraines, HTN, HLD, GERD, CHF (last TTE in 2016 with preserved EF), Afib on Eliquis and amiodarone, CKD who presents for a month of worsening swelling and SOB. Found to have acute CHF   Clinical Impression   This 83 yo female admitted with above presents to acute OT at an independent level, we will sign off.     Follow Up Recommendations  No OT follow up    Equipment Recommendations  None recommended by OT       Precautions / Restrictions Precautions Precautions: None Restrictions Weight Bearing Restrictions: No      Mobility Bed Mobility               General bed mobility comments: pt received in recliner  Transfers Overall transfer level: Independent                    Balance Overall balance assessment: No apparent balance deficits (not formally assessed)                                         ADL either performed or assessed with clinical judgement   ADL Overall ADL's : Independent                                             Vision Patient Visual Report: No change from baseline              Pertinent Vitals/Pain Pain Assessment: No/denies pain     Hand Dominance Right   Extremity/Trunk Assessment Upper Extremity Assessment Upper Extremity Assessment: Overall WFL for tasks assessed   Lower Extremity Assessment Lower Extremity Assessment: Defer to PT evaluation       Communication Communication Communication: HOH   Cognition Arousal/Alertness: Awake/alert Behavior During Therapy: WFL for tasks assessed/performed Overall Cognitive Status: Within Functional Limits for tasks assessed                                                 Home Living Family/patient expects to be discharged to:: Private residence Living Arrangements: Alone Available Help at Discharge: Family;Available PRN/intermittently Type of Home: House Home Access: Stairs to enter [employer] of Steps: 5 Entrance Stairs-Rails: Right;Left;Can reach both Home Layout: One level     Bathroom Shower/Tub: Walk-in Hydrologist: Standard     Home Equipment: Shower seat          Prior Functioning/Environment Level of Independence: Independent                          OT Goals(Current goals can be found in the care plan section) Acute Rehab OT Goals Patient Stated Goal: home today  OT Frequency:                AM-PAC PT "6 Clicks" Daily Activity     Outcome Measure Help from another person eating meals?:  None Help from another person taking care of personal grooming?: None Help from another person toileting, which includes using toliet, bedpan, or urinal?: None Help from another person bathing (including washing, rinsing, drying)?: None Help from another person to put on and taking off regular upper body clothing?: None Help from another person to put on and taking off regular lower body clothing?: None 6 Click Score: 24   End of Session Equipment Utilized During Treatment:  (none)  Activity Tolerance: Patient tolerated treatment well (sats 98% on RA) Patient left: in chair;with call bell/phone within reach                   Time: 1120-1128 OT Time Calculation (min): 8 min Charges:  OT General Charges $OT Visit: 1 Procedure OT Evaluation $OT Eval Low Complexity: 1 Procedure [place], OTR/L [phone] 06/21/2017

## 2017-06-21 NOTE — Evaluation (Signed)
Physical Therapy Evaluation and Discharge  Patient Details Name: [name] MRN: [id number] DOB: 04/28/1932 Today's Date: 06/21/2017   History of Present Illness  [name] is an 83 y.o. female with medical history sigificant of OA, migraines, HTN, HLD, GERD, CHF (last TTE in 2016 with preserved EF), Afib on Eliquis and amiodarone, CKD who presents for a month of worsening swelling and SOB. Found to have acute CHF  Clinical Impression  Pt presented sitting OOB in recliner chair, awake and willing to participate in therapy session. Prior to admission, pt reported that she was independent with all functional mobility and ADLs. Pt ambulated in hallway with supervision without LOB or need for physical assistance. Pt appears to be at her functional baseline. No further acute PT needs identified at this time. PT signing off.     Follow Up Recommendations No PT follow up    Equipment Recommendations  None recommended by PT    Recommendations for Other Services       Precautions / Restrictions Precautions Precautions: Fall Restrictions Weight Bearing Restrictions: No      Mobility  Bed Mobility               General bed mobility comments: pt received in recliner  Transfers Overall transfer level: Modified independent Equipment used: None                Ambulation/Gait Ambulation/Gait assistance: Supervision Ambulation Distance (Feet): 200 Feet Assistive device: None Gait Pattern/deviations: Step-through pattern Gait velocity: WFL Gait velocity interpretation: at or above normal speed for age/gender General Gait Details: mild instability but no overt LOB or need for physical assistance, supervision for safety  Stairs            Wheelchair Mobility    Modified Rankin (Stroke Patients Only)       Balance Overall balance assessment: Needs assistance Sitting-balance support: Feet supported Sitting balance-Leahy Scale: Normal     Standing  balance support: No upper extremity supported;During functional activity Standing balance-Leahy Scale: Good                               Pertinent Vitals/Pain Pain Assessment: No/denies pain    Home Living Family/patient expects to be discharged to:: Private residence Living Arrangements: Alone Available Help at Discharge: Family;Friend(s);Available PRN/intermittently Type of Home: House Home Access: Stairs to enter Entrance Stairs-Rails: Right;Left;Can reach both Entrance Stairs-Number of Steps: 5 Home Layout: One level Home Equipment: Shower seat      Prior Function Level of Independence: Independent               Hand Dominance   Dominant Hand: Right    Extremity/Trunk Assessment   Upper Extremity Assessment Upper Extremity Assessment: Defer to OT evaluation    Lower Extremity Assessment Lower Extremity Assessment: Overall WFL for tasks assessed       Communication   Communication: HOH  Cognition Arousal/Alertness: Awake/alert Behavior During Therapy: WFL for tasks assessed/performed Overall Cognitive Status: Within Functional Limits for tasks assessed                                        General Comments      Exercises     Assessment/Plan    PT Assessment Patent does not need any further PT services  PT Problem List  PT Treatment Interventions      PT Goals (Current goals can be found in the Care Plan section)  Acute Rehab PT Goals Patient Stated Goal: home today    Frequency     Barriers to discharge        Co-evaluation               AM-PAC PT "6 Clicks" Daily Activity  Outcome Measure Difficulty turning over in bed (including adjusting bedclothes, sheets and blankets)?: None Difficulty moving from lying on back to sitting on the side of the bed? : None Difficulty sitting down on and standing up from a chair with arms (e.g., wheelchair, bedside commode, etc,.)?: None Help needed  moving to and from a bed to chair (including a wheelchair)?: None Help needed walking in hospital room?: None Help needed climbing 3-5 steps with a railing? : A Little 6 Click Score: 23    End of Session   Activity Tolerance: Patient tolerated treatment well Patient left: in chair;with call bell/phone within reach Nurse Communication: Mobility status PT Visit Diagnosis: Other abnormalities of gait and mobility (R26.89)    Time: 1230-1240 PT Time Calculation (min) (ACUTE ONLY): 10 min   Charges:   PT Evaluation $PT Eval Low Complexity: 1 Procedure     PT G Codes:        Sherie Don, PT, DPT [place] 06/21/2017, 1:17 PM

## 2017-06-21 NOTE — Discharge Instructions (Signed)

## 2017-06-21 NOTE — Progress Notes (Signed)
Subjective:  No complaints of shortness of breath or chest pain today.  She is feeling better and slept better last night.  She was started on IV Synthroid yesterday by the hospitalist.  Her renal functions improved somewhat but creatinine remains above 1.5 so her Eliquis dose has been adjusted.    BP 137/65 (BP Location: Left Arm)   Pulse 80   Temp (!) 97.3 F (36.3 C) (Oral)   Resp 17   Ht 5\' 4"  (1.626 m)   Wt 85.1 kg (187 lb 9.6 oz) Comment: Scale B  SpO2 98%   BMI 32.20 kg/m    Physical Exam: Elderly WF in NAD Lungs:  Clear  Cardiac: irregular rhythm, normal S1 and S2, no S3 Abdomen:  Soft, nontender, no masses Extremities:  1-2+ edema present  Intake/Output from previous day: 07/24 0701 - 07/25 0700 In: 603 [P.O.:600; I.V.:3] Out: 2800 [Urine:2800] Weight Filed Weights   06/19/17 0500 06/20/17 0517 06/21/17 0426  Weight: 83.6 kg (184 lb 6.4 oz) 84.5 kg (186 lb 4.8 oz) 85.1 kg (187 lb 9.6 oz)    Lab Results: Basic Metabolic Panel:  Recent Labs  06/20/17 0325 06/21/17 0542  NA 132* 136  K 3.7 3.9  CL 96* 99*  CO2 27 31  GLUCOSE 115* 115*  BUN 27* 23*  CREATININE 1.80* 1.63*   BNP    Component Value Date/Time   BNP 652.8 (H) 06/17/2017 1644   Cardiac Panel (last 3 results)  Recent Labs  06/18/17 1117  TROPONINI <0.03   Telemetry: atrial fibrillation-personally reviewed  Assessment/Plan:  1. Persistent atrial fibrillation 2. Hyperkalemia-resolved 3. Worsening renal failure on top of chronic-Creatinine has improved today 4.  Profound hypothyroidism-replacement started.   5.  Acute on chronic systolic heart failure some clinical improvement  Rec:  No change in recommendations from yesterday.  I would be careful with rapid replacement at her age and would discharge on 0.05 mg of Synthroid and we can titrate up as an outpatient.  She will need to see me in 2 weeks.  We'll follow TSHs outpatient and planned to do cardioversion once her thyroid has  been repleted.      Kerry Hough  MD [facility]  06/21/2017, 8:42 AM

## 2017-06-21 NOTE — Care Management Note (Signed)
Case Management Note  Patient Details  Name: [name] MRN: [id number] Date of Birth: [birth year]  Subjective/Objective:       CHF            Action/Plan: CM talked to patient ( very [place]) about [place] choices, pt chose [facility] [place]); Mary with [facility] called for arrangements; patient stated that she does not need any DME ( walker or cane) at this time.  Expected Discharge Date:  06/21/17               Expected Discharge Plan:  [place]  In-House Referral:   [facility]  Discharge planning Services  CM Consult Choice offered to:  Patient  HH Arranged:  RN, Nurse's Aide [place] Agency:  [facility] ([facility])  Status of Service:  Completed, signed off  Sherrilyn Rist [phone] 06/21/2017, 3:06 PM

## 2017-06-21 NOTE — Discharge Summary (Signed)
Discharge Summary  [name]:[id number] DOB: 1932/06/09  PCP: Fanny Bien, MD  Admit date: 06/17/2017 Discharge date: 06/21/2017  Time spent: >8mins, more than 50% time spent on coordination of care  Recommendations for Outpatient Follow-up:  1. F/u with PMD within a week  for hospital discharge follow up, repeat cbc/bmp at follow up 2. F/u with cardiology  Discharge Diagnoses:  Active Hospital Problems   Diagnosis Date Noted  . Chronic atrial fibrillation ([place])   . Atrial fibrillation with RVR ([place])   . Hyperkalemia   . Current use of long term anticoagulation 06/18/2017  . Sleep apnea 06/18/2017  . Obesity (BMI 30-39.9) 06/18/2017  . LBBB (left bundle branch block) 06/18/2017  . CKD (chronic kidney disease), stage III   . Acute exacerbation of CHF (congestive heart failure) ([place]) 06/17/2017  . Persistent atrial fibrillation ([place])   . Hypertensive heart disease without CHF 02/14/2011    Resolved Hospital Problems   Diagnosis Date Noted Date Resolved  No resolved problems to display.    Discharge Condition: stable  Diet recommendation: heart healthy  Filed Weights   06/19/17 0500 06/20/17 0517 06/21/17 0426  Weight: 83.6 kg (184 lb 6.4 oz) 84.5 kg (186 lb 4.8 oz) 85.1 kg (187 lb 9.6 oz)    History of present illness:  PCP: Fanny Bien, MD  Patient coming from: Home  Chief Complaint: SOB, bloating  HPI: [name] is a 83 y.o. female with medical history significant of OA, migraines, HTN, HLD, GERD, CHF (last TTE in 2016 with preserved EF), Afib on Eliquis and amiodarone, CKD who presents for a month of worsening swelling and SOB.  She notes that she has been getting worse for the past year, but her symptoms really progressed starting Wednesday.  She has noticed increased abdominal bloating, shortness of breath, difficulty lying flat.  She has progressed from needing 1 pillow to sleep to 2 and for the last 2 nights she had to sit up in the  recliner to sleep.  She further notes a cough without sputum production and wheezing.  She believes the worst part of her symptoms started in May when she developed the stomach flu and it just never got better.  She also notes increased nausea since starting a new medication at Dr. Thurman Coyer office in April.  She notes that since that time she will have intermittent diarrhea and nausea.  She notes that her symptoms became so bad yesterday that she took an old lasix from 2011 hoping it would help with her SOB.  She reports being on lasix for 20+ years due to chronic swelling in her legs which she notes all of her family have.  She also noted some very mild chest pain which is mainly in the lower middle chest, epigastrium and radiates around to the axilla.  She could not characterize it more than that and was not concerned about it.  SHe further reports a weight gain from 183 - 192 in the last few days.   On questioning, she noted mild pain back pain and suprapubic pain which has been going on for months.    I reviewed the Neurology note from February which noted need for sleep study, appears to have been done in December of 2017.  I do not see any Cardiology notes in our records.   ED Course: In the ED, she was noted to have a Na of 130, K of 4.9, Cr of 1.49 which appears to be within  her baseline.  She had a WBC of 12.4 and a nitrite + UA (squamous cells not mentioned, many bacteria).  BNP was 652.  TnI was < 0.03.  CXR done showed vascular congestion.  EKG showed Afib with RVR of 118.  She had a nonspecific conduction delay.  I reviewed her EKG from 2016 and it appeared similar, likely LBBB.  TTE from 2016 showed preserved EF, diastolic function could not be determined.     Hospital Course:  Active Problems:   Hypertensive heart disease without CHF   Persistent atrial fibrillation ([facility])   Acute exacerbation of CHF (congestive heart failure) (H[facility])   CKD (chronic kidney disease), stage III   Current  use of long term anticoagulation   Sleep apnea   Obesity (BMI 30-39.9)   LBBB (left bundle branch block)   Atrial fibrillation with RVR (H[facility])   Hyperkalemia   Chronic atrial fibrillation (H[facility])   Acute systolic CH F: -she presented with sob, cxr on admission with vascular congestion and small bilateral pleural effusions. Echo with mildly reduced systolic function with EF of 40-45%. - Patient denied chest pain, shortness of breath.Troponin negative.  -she is treated with iv lasix initially with good urine output, she is feeling better, she is continued on atenolol. Losartan on hold and not restarted at discharge due to cr elevation and hyperkalemia tendency. She is discharged on oral lasix 20mg  every MWF -cardiology consulted, input appreciated, she is to follow with cardiology Dr Wynonia Lawman in two weeks, further meds adjustment per cardiology.  A. fib with RVR: Patient with history of atrial fibrillation. She remain in afib, Heart rate is better controlled. Continue amiodarone 200mg  daily, atenolol and diltiazem. On eliquis for anticoagulation. Plan for cardioversion as per cardiologist as outpatient.  Hyponatremia, sodium 130 on admission, sodium normalized at discharge 136.   Acute kidney injury on Chronic kidney disease is stage III/ mild hyperkalemia:  - Ultrasound of kidneys with no hydronephrosis. ua no infection - potassium normalized, cr peaked at 1.8, trending down at discharge. Continue to hold losartan -she is discharge on low dose lasix every mwf, repeat bmp at hospital discharge follow up.  Severe hypothyroidism: Patient with TSH of 138. She report was on synthroid many years ago. Patient with clinical symptoms of generalized weakness fatigue, constipation and not feeling well. She is Started on Synthroid IV 100 g in the hospital.  -she is discharged on oral synthroid 42mcg daily, need to repeat tsh in 4-6 weeks -she has been on amiodarone, this could also contribute to  thyroids dysfunction, she is to follow with cardiology.  Essential hypertension: Blood pressure acceptable. Continue current cardiac medication. Monitor BP.  Hperlipidemia: Continue statin.   Generalized weakness: feeling better, PT/OT has evaluated her, no PT/OT follow up recommended. Home health RN ordered for medication management/heart failure management  constipation: stool softener, replace thyroids hormone.   DVT prophylaxis: eliquis Code Status: Full code Family Communication: No family at bedside Disposition Plan: discharge home  With cardiology clearance, case discussed with cardiology Dr Wynonia Lawman prior to discharge    Consultants:   Cardiology Dr Wynonia Lawman  Procedures: Echo  Antimicrobials: None   Discharge Exam: BP 134/67 (BP Location: Left Arm)   Pulse 75   Temp 97.8 F (36.6 C) (Oral)   Resp 18   Ht 5\' 4"  (1.626 m)   Wt 85.1 kg (187 lb 9.6 oz) Comment: Scale B  SpO2 99%   BMI 32.20 kg/m   General: NAD Cardiovascular: IRRR Respiratory: CTABL Extremity: no  edema  Discharge Instructions You were cared for by a hospitalist during your hospital stay. If you have any questions about your discharge medications or the care you received while you were in the hospital after you are discharged, you can call the unit and asked to speak with the hospitalist on call if the hospitalist that took care of you is not available. Once you are discharged, your primary care physician will handle any further medical issues. Please note that NO REFILLS for any discharge medications will be authorized once you are discharged, as it is imperative that you return to your primary care physician (or establish a relationship with a primary care physician if you do not have one) for your aftercare needs so that they can reassess your need for medications and monitor your lab values.  Discharge Instructions    Diet - low sodium heart healthy    Complete by:  As directed     Discharge instructions    Complete by:  As directed    Repeat basic lab work BMP at hospital discharge follow up.   Increase activity slowly    Complete by:  As directed      Allergies as of 06/21/2017      Reactions   Ampicillin Shortness Of Breath, Swelling, Rash   Hydromorphone Hcl Shortness Of Breath, Itching, Swelling, Rash   Penicillins Anaphylaxis   Has patient had a PCN reaction causing immediate rash, facial/tongue/throat swelling, SOB or lightheadedness with hypotension: Yes Has patient had a PCN reaction causing severe rash involving mucus membranes or skin necrosis: Unknown Has patient had a PCN reaction that required hospitalization: Yes (was in the hosp) Has patient had a PCN reaction occurring within the last 10 years: No If all of the above answers are "NO", then may proceed with Cephalosporin use.      Medication List    STOP taking these medications   losartan-hydrochlorothiazide 100-25 MG tablet Commonly known as:  HYZAAR     TAKE these medications   acetaminophen 325 MG tablet Commonly known as:  TYLENOL Take 2 tablets (650 mg total) by mouth every 6 (six) hours as needed for mild pain (or Fever >/= 101).   amiodarone 200 MG tablet Commonly known as:  PACERONE Take 1 tablet (200 mg total) by mouth daily. What changed:  when to take this   apixaban 5 MG Tabs tablet Commonly known as:  ELIQUIS Take 1 tablet (5 mg total) by mouth 2 (two) times daily.   atenolol 50 MG tablet Commonly known as:  TENORMIN Take 50 mg by mouth daily.   atorvastatin 80 MG tablet Commonly known as:  LIPITOR Take 1 tablet (80 mg total) by mouth daily.   butalbital-acetaminophen-caffeine 50-325-40 MG tablet Commonly known as:  FIORICET, ESGIC Take 1 tablet by mouth every 4 (four) hours as needed (for headaches).   diltiazem 180 MG 24 hr capsule Commonly known as:  CARDIZEM CD Take 180 mg by mouth daily.   furosemide 20 MG tablet Commonly known as:  LASIX Take 1 tablet  (20 mg total) by mouth every Monday, Wednesday, and Friday.   levothyroxine 50 MCG tablet Commonly known as:  SYNTHROID Take 1 tablet (50 mcg total) by mouth daily.   omeprazole 20 MG tablet Commonly known as:  PRILOSEC OTC Take 20 mg by mouth daily as needed (for reflux).   polyethylene glycol packet Commonly known as:  MIRALAX / GLYCOLAX Take 17 g by mouth daily.   senna-docusate 8.6-50 MG tablet  Commonly known as:  Senokot-S Take 1 tablet by mouth at bedtime.      Allergies  Allergen Reactions  . Ampicillin Shortness Of Breath, Swelling and Rash  . Hydromorphone Hcl Shortness Of Breath, Itching, Swelling and Rash  . Penicillins Anaphylaxis    Has patient had a PCN reaction causing immediate rash, facial/tongue/throat swelling, SOB or lightheadedness with hypotension: Yes Has patient had a PCN reaction causing severe rash involving mucus membranes or skin necrosis: Unknown Has patient had a PCN reaction that required hospitalization: Yes (was in the hosp) Has patient had a PCN reaction occurring within the last 10 years: No If all of the above answers are "NO", then may proceed with Cephalosporin use.    Follow-up Information    Fanny Bien, MD Follow up on 06/30/2017.   Specialty:  Family Medicine Why:  @2 :30 pm, for hospital discharge follow up, repeat bmp at follow up,  repeat tsh in 3-4 weeks, Contact information: [address] [place] [address] [phone]        Jacolyn Reedy, MD Follow up on 06/29/2017.   Specialty:  Cardiology Why:  for afib and CHF, @1 :45pm Contact information: [address] [place] [address] [phone]            The results of significant diagnostics from this hospitalization (including imaging, microbiology, ancillary and laboratory) are listed below for reference.    Significant Diagnostic Studies: Dg Chest 2 View  Result Date: 06/17/2017 CLINICAL DATA:  Shortness of breath EXAM:  CHEST  2 VIEW COMPARISON:  11/27/2014 FINDINGS: AP and lateral views of the chest show low volumes. Stable asymmetric elevation right hemidiaphragm. Cardiopericardial silhouette is at upper limits of normal for size. There is pulmonary vascular congestion without overt pulmonary edema. Probable atelectasis at the bases. Small bilateral pleural effusions are evident. IMPRESSION: Low volume film with vascular congestion and small bilateral pleural effusions. Bibasilar atelectasis. Electronically Signed   By: Misty Stanley M.D.   On: 06/17/2017 17:37   US Renal  Result Date: 06/19/2017 CLINICAL DATA:  83 year old hypertensive female with renal failure. Initial encounter. EXAM: RENAL / URINARY TRACT ULTRASOUND COMPLETE COMPARISON:  None. FINDINGS: Right Kidney: Length: 9.2 cm. Echogenicity within normal limits. Slightly lobulated contour. No mass or hydronephrosis visualized. Left Kidney: Length: 10.2 cm. Echogenicity within normal limits. Slightly lobulated contour. No mass or hydronephrosis visualized. Bladder: Appears normal for degree of bladder distention. IMPRESSION: No hydronephrosis. Slightly lobulated renal contour bilaterally without mass identified. Electronically Signed   By: Genia Del M.D.   On: 06/19/2017 12:20    Microbiology: Recent Results (from the past 240 hour(s))  Culture, Urine     Status: Abnormal   Collection Time: 06/18/17  8:30 AM  Result Value Ref Range Status   Specimen Description URINE, RANDOM  Final   Special Requests NONE  Final   Culture <10,000 COLONIES/mL INSIGNIFICANT GROWTH (A)  Final   Report Status 06/19/2017 FINAL  Final     Labs: Basic Metabolic Panel:  Recent Labs Lab 06/17/17 1644 06/18/17 0344 06/19/17 0357 06/20/17 0325 06/21/17 0542  NA 130* 134* 131* 132* 136  K 4.9 4.8 5.7* 3.7 3.9  CL 95* 96* 96* 96* 99*  CO2 25 28 25 27 31   GLUCOSE 124* 105* 106* 115* 115*  BUN 19 18 25* 27* 23*  CREATININE 1.49* 1.66* 1.71* 1.80* 1.63*  CALCIUM 9.0  9.1 8.5* 8.3* 8.7*   Liver Function Tests: No results for input(s):  AST, ALT, ALKPHOS, BILITOT, PROT, ALBUMIN in the last 168 hours. No results for input(s): LIPASE, AMYLASE in the last 168 hours. No results for input(s): AMMONIA in the last 168 hours. CBC:  Recent Labs Lab 06/17/17 1644  WBC 12.4*  NEUTROABS 9.6*  HGB 11.5*  HCT 36.4  MCV 92.6  PLT 284   Cardiac Enzymes:  Recent Labs Lab 06/17/17 1644 06/17/17 2152 06/18/17 0344 06/18/17 1117  TROPONINI <0.03 <0.03 <0.03 <0.03   BNP: BNP (last 3 results)  Recent Labs  06/17/17 1644  BNP 652.8*    ProBNP (last 3 results) No results for input(s): PROBNP in the last 8760 hours.  CBG: No results for input(s): GLUCAP in the last 168 hours.     SignedFlorencia Reasons MD, [name]  [facility] 06/21/2017, 3:00 PM

## 2017-06-27 ENCOUNTER — Other Ambulatory Visit: Payer: Self-pay

## 2017-06-27 NOTE — Patient Outreach (Signed)
[place] [facility]) Care Management  06/27/2017  [name] 24-Jul-1932 [id number]       EMMI-HF RED ON EMMI ALERT Day # 4 Date: 06/26/17 Red Alert Reason: "Weight? 190 lbs"   Outreach attempt #1 to patient. No answer. RN CM left HIPAA compliant voicemail message along with contact info.      Plan: RN CM will make outreach attempt to patient within one business day if no return call from patient.    Enzo Montgomery, RN,BSN,CCM [place] Management Telephonic Care Management Coordinator Direct Phone: [phone] Toll Free: [phone] Fax: [phone]

## 2017-06-27 NOTE — Patient Outreach (Signed)
[place] [facility]) Care Management  06/27/2017  ANEDRA PENAFIEL [birth year] [id number]   EMMI-HF RED ON EMMI ALERT Day # 4 Date: 06/26/17 Red Alert Reason: "Weight? 190 lbs"   Voicemail message received from patient returning RN CM call. Return call placed to patient. No answer at present and unable to leave message.     Plan: RN CM will make outreach attempt to patient within one business day.    Enzo Montgomery, RN,BSN,CCM [place] Management Telephonic Care Management Coordinator Direct Phone: [phone] Toll Free: [phone] Fax: [phone]

## 2017-06-28 ENCOUNTER — Other Ambulatory Visit: Payer: Self-pay

## 2017-06-28 NOTE — Patient Outreach (Addendum)
[place] [facility]) Care Management  06/28/2017  [name] 22-Jul-1932 [id number]     EMMI-HF RED ON EMMI ALERT Day # 4 Date: 06/26/17 Red Alert Reason: "Weight? 190 lbs"   Incoming call from patient returning RN CM call.  Reviewed and addressed red alert with patient. Patient confirms that she did go up in weight a few pounds. She is back down to 189 lbs today. Patient aware and able to verbalize s/s of worsening condition and when to notify MD of changes in weight. She reports that she has occasional edema and SOB at times but symptoms have not been worse than normal. She reports she was very tired on yesterday and decided to rescheduled her PCP f/u appt. She changed appt to 07/03/17. She has f/u appt with cardiologist on tomorrow. Patient able to drive herself to medical appts. She voices no issues with her meds and is able to self manage her meds. She voices that she has not had visit from [facility] yet. She states she had a missed call from agency and returned call but no one answered and she did not hear back further from agency. Advised patient that RN CM would follow up with agency.   Plan: RN CM will notify [facility] administrative assistant of case status. RN CM will send EMMI HF educational info to patient via mail.  10:55am-RN CM called [facility]. Spoke with Deneise Lever and advised that patient had not received visit yet. Advised that Anderson Malta from [facility] would follow up with patient today. 11:05am-Return call placed to patient and patient made aware that [facility] agency would be contacting her today.  Enzo Montgomery, RN,BSN,CCM [place] Management Telephonic Care Management Coordinator Direct Phone: [phone] Toll Free: [phone] Fax: [phone]

## 2017-06-28 NOTE — Patient Outreach (Signed)
[place] [facility]) Care Management  06/28/2017  HERMENIA FRITCHER [birth year] [id number]   EMMI-HF RED ON EMMI ALERT Day # 4 Date: 06/26/17 Red Alert Reason: "Weight? 190 lbs"    Outreach attempt #2 to patient. No answer at present. RN CM left HIPAA compliant voicemail message along with contact info.     Plan: RN CM will send unsuccessful outreach letter to patient and close case if no response within 10 business days.   Enzo Montgomery, RN,BSN,CCM [place] Management Telephonic Care Management Coordinator Direct Phone: [phone] Toll Free: [phone] Fax: [phone]

## 2017-07-03 ENCOUNTER — Other Ambulatory Visit: Payer: Self-pay

## 2017-07-03 NOTE — Patient Outreach (Signed)
[place] [facility]) Care Management  07/03/2017  [name] 1932/03/24 [id number]     EMMI-HF RED ON EMMI ALERT Day # 10 Date: 07/02/17 Red Alert Reason: "Weight: 190 lbs"   Outreach attempt # 1 to patient. Spoke with patient .She states she is not feeling well to today and complains of feeling nauseated. She denies any other GI symptoms. She voices that she was able to eat some yogurt and take her morning meds. RN CM provided patient with other food options to possible help alleviate symptoms. She is aware to contact MD for any worsening and/or unrelieved symptoms. Patient states she has cardiologist f/u appt tomorrow. Reviewed and addressed red alert with patient. Her weight did go up two pounds within a day. She denies any worsening HF symptoms. Patient aware that she is to alert MD of weight gain of 5 lbs in one week and/or 3lb weight gain in one day. She states since she is not feeling well she has not weighed herself today yet. No further RN CM needs or concerns voiced by patient at this time.         Plan: RN CM will notify [facility] administrative assistant of case status.    Enzo Montgomery, RN,BSN,CCM [place] Management Telephonic Care Management Coordinator Direct Phone: [phone] Toll Free: [phone] Fax: [phone]

## 2017-07-21 DIAGNOSIS — Z79899 Other long term (current) drug therapy: Secondary | ICD-10-CM | POA: Diagnosis not present

## 2017-07-21 DIAGNOSIS — I10 Essential (primary) hypertension: Secondary | ICD-10-CM | POA: Diagnosis not present

## 2017-07-21 DIAGNOSIS — N289 Disorder of kidney and ureter, unspecified: Secondary | ICD-10-CM | POA: Diagnosis not present

## 2017-07-21 DIAGNOSIS — I4891 Unspecified atrial fibrillation: Secondary | ICD-10-CM | POA: Diagnosis not present

## 2017-08-01 ENCOUNTER — Other Ambulatory Visit: Payer: Self-pay

## 2017-08-01 NOTE — Patient Outreach (Signed)
[place] [facility]) Care Management  08/01/2017  [name] 03-31-1932 [id number]     EMMI-HF RED ON EMMI ALERT Day # 39 Date: 07/31/17 Red Alert Reason: "New/worsening problems? Yes     New swelling? Yes"   Outreach attempt # 1 to patient. Spoke with patient who states she is not feeling too well today. She voices that she has good and bad days. She states that she woke up today with a  little more swelling in her legs. Her weight was 189 lbs today. She denies any SOB or resp symptoms. Patient reports she had PCP f/u appt scheduled for today but cancelled due to not feeling well and also because her friend who was going to take her didn't feel well either. Patient states she did not feel comfortable driving today to her appt. RN CM inquired further regarding patient's symptoms and patient voiced that she has not taken her Lasix in four days. She reports that she is "unhappy with cardiologist." She plans to find a new one and did not "feel right" about calling in refill on Lasix  that cardiologist had prescribed. Patient did check pill bottle while on the phone with RN CM. She confirmed that she does have a refill on med. RN CM encouraged patient that she should still get med refilled to prevent worsening of symptoms and HF exacerbation. RN CM also instructed patient that she should alert MD if her symptoms are unimproved and/or worsened within the next 24 hrs. RN CM offered to contact MD office for patient but she declined. She voiced understanding and reported she would call in Lasix refill and have med picked up today and take a dose. Patient voices that she is adhering to low salt diet as she does not add or cook with salt at all. No further RN CM needs or concerns at this time. Patient is on day #39 of 45 EMMI-HF post discharge calls.        Plan: RN CM will notify [facility] administrative assistant of case status. RN CM will send HF educational info to patient to  review.   Enzo Montgomery, RN,BSN,CCM [place] Management Telephonic Care Management Coordinator Direct Phone: [phone] Toll Free: [phone] Fax: [phone]

## 2017-08-07 DIAGNOSIS — Z23 Encounter for immunization: Secondary | ICD-10-CM | POA: Diagnosis not present

## 2017-08-07 DIAGNOSIS — N289 Disorder of kidney and ureter, unspecified: Secondary | ICD-10-CM | POA: Diagnosis not present

## 2017-08-07 DIAGNOSIS — I10 Essential (primary) hypertension: Secondary | ICD-10-CM | POA: Diagnosis not present

## 2017-08-07 DIAGNOSIS — E876 Hypokalemia: Secondary | ICD-10-CM | POA: Diagnosis not present

## 2017-08-07 DIAGNOSIS — R51 Headache: Secondary | ICD-10-CM | POA: Diagnosis not present

## 2017-08-07 DIAGNOSIS — E039 Hypothyroidism, unspecified: Secondary | ICD-10-CM | POA: Diagnosis not present

## 2017-08-17 DIAGNOSIS — E039 Hypothyroidism, unspecified: Secondary | ICD-10-CM | POA: Diagnosis not present

## 2017-08-17 DIAGNOSIS — I480 Paroxysmal atrial fibrillation: Secondary | ICD-10-CM | POA: Diagnosis not present

## 2017-08-17 DIAGNOSIS — R0609 Other forms of dyspnea: Secondary | ICD-10-CM | POA: Diagnosis not present

## 2017-08-17 DIAGNOSIS — Z7901 Long term (current) use of anticoagulants: Secondary | ICD-10-CM | POA: Diagnosis not present

## 2017-08-17 DIAGNOSIS — N183 Chronic kidney disease, stage 3 (moderate): Secondary | ICD-10-CM | POA: Diagnosis not present

## 2017-08-17 DIAGNOSIS — I2510 Atherosclerotic heart disease of native coronary artery without angina pectoris: Secondary | ICD-10-CM | POA: Diagnosis not present

## 2017-08-17 DIAGNOSIS — G4733 Obstructive sleep apnea (adult) (pediatric): Secondary | ICD-10-CM | POA: Diagnosis not present

## 2017-08-17 DIAGNOSIS — I119 Hypertensive heart disease without heart failure: Secondary | ICD-10-CM | POA: Diagnosis not present

## 2017-08-17 DIAGNOSIS — R0602 Shortness of breath: Secondary | ICD-10-CM | POA: Diagnosis not present

## 2017-08-17 DIAGNOSIS — E668 Other obesity: Secondary | ICD-10-CM | POA: Diagnosis not present

## 2017-08-17 DIAGNOSIS — I5032 Chronic diastolic (congestive) heart failure: Secondary | ICD-10-CM | POA: Diagnosis not present

## 2017-08-17 DIAGNOSIS — I447 Left bundle-branch block, unspecified: Secondary | ICD-10-CM | POA: Diagnosis not present

## 2017-11-24 ENCOUNTER — Encounter (HOSPITAL_COMMUNITY): Payer: Self-pay | Admitting: *Deleted

## 2017-11-24 ENCOUNTER — Emergency Department (HOSPITAL_COMMUNITY): Payer: Medicare Other

## 2017-11-24 ENCOUNTER — Inpatient Hospital Stay (HOSPITAL_COMMUNITY)
Admission: EM | Admit: 2017-11-24 | Discharge: 2017-12-08 | DRG: 291 | Disposition: A | Discharge status: Skilled Nursing Facility | Payer: Medicare Other | Attending: Internal Medicine | Admitting: Internal Medicine

## 2017-11-24 ENCOUNTER — Other Ambulatory Visit: Payer: Self-pay

## 2017-11-24 DIAGNOSIS — I5021 Acute systolic (congestive) heart failure: Secondary | ICD-10-CM | POA: Diagnosis not present

## 2017-11-24 DIAGNOSIS — I5022 Chronic systolic (congestive) heart failure: Secondary | ICD-10-CM | POA: Diagnosis present

## 2017-11-24 DIAGNOSIS — Z79890 Hormone replacement therapy (postmenopausal): Secondary | ICD-10-CM | POA: Diagnosis not present

## 2017-11-24 DIAGNOSIS — H9190 Unspecified hearing loss, unspecified ear: Secondary | ICD-10-CM | POA: Diagnosis present

## 2017-11-24 DIAGNOSIS — R609 Edema, unspecified: Secondary | ICD-10-CM | POA: Diagnosis not present

## 2017-11-24 DIAGNOSIS — I5040 Unspecified combined systolic (congestive) and diastolic (congestive) heart failure: Secondary | ICD-10-CM | POA: Diagnosis not present

## 2017-11-24 DIAGNOSIS — I083 Combined rheumatic disorders of mitral, aortic and tricuspid valves: Secondary | ICD-10-CM | POA: Diagnosis present

## 2017-11-24 DIAGNOSIS — I2510 Atherosclerotic heart disease of native coronary artery without angina pectoris: Secondary | ICD-10-CM | POA: Diagnosis present

## 2017-11-24 DIAGNOSIS — I340 Nonrheumatic mitral (valve) insufficiency: Secondary | ICD-10-CM

## 2017-11-24 DIAGNOSIS — I5023 Acute on chronic systolic (congestive) heart failure: Secondary | ICD-10-CM | POA: Diagnosis not present

## 2017-11-24 DIAGNOSIS — I482 Chronic atrial fibrillation, unspecified: Secondary | ICD-10-CM | POA: Diagnosis present

## 2017-11-24 DIAGNOSIS — I50813 Acute on chronic right heart failure: Secondary | ICD-10-CM

## 2017-11-24 DIAGNOSIS — M6389 Disorders of muscle in diseases classified elsewhere, multiple sites: Secondary | ICD-10-CM | POA: Diagnosis not present

## 2017-11-24 DIAGNOSIS — M479 Spondylosis, unspecified: Secondary | ICD-10-CM | POA: Diagnosis present

## 2017-11-24 DIAGNOSIS — Z880 Allergy status to penicillin: Secondary | ICD-10-CM | POA: Diagnosis not present

## 2017-11-24 DIAGNOSIS — I700 Atherosclerosis of aorta: Secondary | ICD-10-CM | POA: Diagnosis present

## 2017-11-24 DIAGNOSIS — N179 Acute kidney failure, unspecified: Secondary | ICD-10-CM | POA: Diagnosis present

## 2017-11-24 DIAGNOSIS — I481 Persistent atrial fibrillation: Secondary | ICD-10-CM | POA: Diagnosis present

## 2017-11-24 DIAGNOSIS — Z6834 Body mass index (BMI) 34.0-34.9, adult: Secondary | ICD-10-CM

## 2017-11-24 DIAGNOSIS — S90821A Blister (nonthermal), right foot, initial encounter: Secondary | ICD-10-CM | POA: Diagnosis not present

## 2017-11-24 DIAGNOSIS — R0603 Acute respiratory distress: Secondary | ICD-10-CM | POA: Diagnosis not present

## 2017-11-24 DIAGNOSIS — E876 Hypokalemia: Secondary | ICD-10-CM | POA: Diagnosis present

## 2017-11-24 DIAGNOSIS — I480 Paroxysmal atrial fibrillation: Secondary | ICD-10-CM | POA: Diagnosis present

## 2017-11-24 DIAGNOSIS — Z9841 Cataract extraction status, right eye: Secondary | ICD-10-CM

## 2017-11-24 DIAGNOSIS — R5383 Other fatigue: Secondary | ICD-10-CM | POA: Diagnosis not present

## 2017-11-24 DIAGNOSIS — Z955 Presence of coronary angioplasty implant and graft: Secondary | ICD-10-CM

## 2017-11-24 DIAGNOSIS — I130 Hypertensive heart and chronic kidney disease with heart failure and stage 1 through stage 4 chronic kidney disease, or unspecified chronic kidney disease: Principal | ICD-10-CM

## 2017-11-24 DIAGNOSIS — R0989 Other specified symptoms and signs involving the circulatory and respiratory systems: Secondary | ICD-10-CM | POA: Diagnosis not present

## 2017-11-24 DIAGNOSIS — E039 Hypothyroidism, unspecified: Secondary | ICD-10-CM

## 2017-11-24 DIAGNOSIS — Z881 Allergy status to other antibiotic agents status: Secondary | ICD-10-CM

## 2017-11-24 DIAGNOSIS — I255 Ischemic cardiomyopathy: Secondary | ICD-10-CM | POA: Diagnosis present

## 2017-11-24 DIAGNOSIS — I447 Left bundle-branch block, unspecified: Secondary | ICD-10-CM | POA: Diagnosis present

## 2017-11-24 DIAGNOSIS — L97519 Non-pressure chronic ulcer of other part of right foot with unspecified severity: Secondary | ICD-10-CM | POA: Diagnosis present

## 2017-11-24 DIAGNOSIS — Z8249 Family history of ischemic heart disease and other diseases of the circulatory system: Secondary | ICD-10-CM

## 2017-11-24 DIAGNOSIS — Z9119 Patient's noncompliance with other medical treatment and regimen: Secondary | ICD-10-CM

## 2017-11-24 DIAGNOSIS — E873 Alkalosis: Secondary | ICD-10-CM | POA: Diagnosis present

## 2017-11-24 DIAGNOSIS — I878 Other specified disorders of veins: Secondary | ICD-10-CM | POA: Diagnosis not present

## 2017-11-24 DIAGNOSIS — X58XXXA Exposure to other specified factors, initial encounter: Secondary | ICD-10-CM | POA: Diagnosis not present

## 2017-11-24 DIAGNOSIS — Z961 Presence of intraocular lens: Secondary | ICD-10-CM | POA: Diagnosis present

## 2017-11-24 DIAGNOSIS — D631 Anemia in chronic kidney disease: Secondary | ICD-10-CM | POA: Diagnosis present

## 2017-11-24 DIAGNOSIS — J90 Pleural effusion, not elsewhere classified: Secondary | ICD-10-CM | POA: Diagnosis present

## 2017-11-24 DIAGNOSIS — R41841 Cognitive communication deficit: Secondary | ICD-10-CM | POA: Diagnosis not present

## 2017-11-24 DIAGNOSIS — R05 Cough: Secondary | ICD-10-CM

## 2017-11-24 DIAGNOSIS — R001 Bradycardia, unspecified: Secondary | ICD-10-CM | POA: Diagnosis not present

## 2017-11-24 DIAGNOSIS — K219 Gastro-esophageal reflux disease without esophagitis: Secondary | ICD-10-CM | POA: Diagnosis present

## 2017-11-24 DIAGNOSIS — Z79899 Other long term (current) drug therapy: Secondary | ICD-10-CM

## 2017-11-24 DIAGNOSIS — I509 Heart failure, unspecified: Secondary | ICD-10-CM | POA: Diagnosis not present

## 2017-11-24 DIAGNOSIS — G4733 Obstructive sleep apnea (adult) (pediatric): Secondary | ICD-10-CM

## 2017-11-24 DIAGNOSIS — I5043 Acute on chronic combined systolic (congestive) and diastolic (congestive) heart failure: Secondary | ICD-10-CM | POA: Diagnosis present

## 2017-11-24 DIAGNOSIS — N189 Chronic kidney disease, unspecified: Secondary | ICD-10-CM

## 2017-11-24 DIAGNOSIS — J9601 Acute respiratory failure with hypoxia: Secondary | ICD-10-CM

## 2017-11-24 DIAGNOSIS — N183 Chronic kidney disease, stage 3 (moderate): Secondary | ICD-10-CM | POA: Diagnosis present

## 2017-11-24 DIAGNOSIS — Z7901 Long term (current) use of anticoagulants: Secondary | ICD-10-CM | POA: Diagnosis not present

## 2017-11-24 DIAGNOSIS — E785 Hyperlipidemia, unspecified: Secondary | ICD-10-CM | POA: Diagnosis not present

## 2017-11-24 DIAGNOSIS — R10815 Periumbilic abdominal tenderness: Secondary | ICD-10-CM | POA: Diagnosis not present

## 2017-11-24 DIAGNOSIS — Z9842 Cataract extraction status, left eye: Secondary | ICD-10-CM

## 2017-11-24 DIAGNOSIS — Z90710 Acquired absence of both cervix and uterus: Secondary | ICD-10-CM

## 2017-11-24 DIAGNOSIS — E874 Mixed disorder of acid-base balance: Secondary | ICD-10-CM | POA: Diagnosis not present

## 2017-11-24 DIAGNOSIS — I872 Venous insufficiency (chronic) (peripheral): Secondary | ICD-10-CM | POA: Diagnosis present

## 2017-11-24 DIAGNOSIS — R000 Tachycardia, unspecified: Secondary | ICD-10-CM | POA: Diagnosis not present

## 2017-11-24 DIAGNOSIS — J9600 Acute respiratory failure, unspecified whether with hypoxia or hypercapnia: Secondary | ICD-10-CM | POA: Diagnosis not present

## 2017-11-24 DIAGNOSIS — S0990XA Unspecified injury of head, initial encounter: Secondary | ICD-10-CM | POA: Diagnosis not present

## 2017-11-24 DIAGNOSIS — R51 Headache: Secondary | ICD-10-CM | POA: Diagnosis not present

## 2017-11-24 DIAGNOSIS — R0602 Shortness of breath: Secondary | ICD-10-CM | POA: Diagnosis not present

## 2017-11-24 DIAGNOSIS — Z885 Allergy status to narcotic agent status: Secondary | ICD-10-CM

## 2017-11-24 DIAGNOSIS — Z6830 Body mass index (BMI) 30.0-30.9, adult: Secondary | ICD-10-CM | POA: Diagnosis not present

## 2017-11-24 DIAGNOSIS — E669 Obesity, unspecified: Secondary | ICD-10-CM | POA: Diagnosis present

## 2017-11-24 DIAGNOSIS — M6281 Muscle weakness (generalized): Secondary | ICD-10-CM | POA: Diagnosis not present

## 2017-11-24 DIAGNOSIS — R2681 Unsteadiness on feet: Secondary | ICD-10-CM | POA: Diagnosis not present

## 2017-11-24 DIAGNOSIS — R278 Other lack of coordination: Secondary | ICD-10-CM | POA: Diagnosis not present

## 2017-11-24 LAB — CBC WITH DIFFERENTIAL/PLATELET
BASOS ABS: 0 10*3/uL (ref 0.0–0.1)
Basophils Relative: 0 %
EOS ABS: 0 10*3/uL (ref 0.0–0.7)
EOS PCT: 0 %
HCT: 34.1 % — ABNORMAL LOW (ref 36.0–46.0)
Hemoglobin: 10.5 g/dL — ABNORMAL LOW (ref 12.0–15.0)
LYMPHS PCT: 14 %
Lymphs Abs: 1.8 10*3/uL (ref 0.7–4.0)
MCH: 25.9 pg — AB (ref 26.0–34.0)
MCHC: 30.8 g/dL (ref 30.0–36.0)
MCV: 84 fL (ref 78.0–100.0)
MONO ABS: 0.7 10*3/uL (ref 0.1–1.0)
Monocytes Relative: 6 %
Neutro Abs: 9.7 10*3/uL — ABNORMAL HIGH (ref 1.7–7.7)
Neutrophils Relative %: 80 %
PLATELETS: 315 10*3/uL (ref 150–400)
RBC: 4.06 MIL/uL (ref 3.87–5.11)
RDW: 19.3 % — AB (ref 11.5–15.5)
WBC: 12.1 10*3/uL — ABNORMAL HIGH (ref 4.0–10.5)

## 2017-11-24 LAB — RESPIRATORY PANEL BY PCR
Adenovirus: NOT DETECTED
Bordetella pertussis: NOT DETECTED
CHLAMYDOPHILA PNEUMONIAE-RVPPCR: NOT DETECTED
Coronavirus 229E: NOT DETECTED
Coronavirus HKU1: NOT DETECTED
Coronavirus NL63: NOT DETECTED
Coronavirus OC43: NOT DETECTED
INFLUENZA B-RVPPCR: NOT DETECTED
Influenza A: NOT DETECTED
Metapneumovirus: NOT DETECTED
Mycoplasma pneumoniae: NOT DETECTED
PARAINFLUENZA VIRUS 4-RVPPCR: NOT DETECTED
Parainfluenza Virus 1: NOT DETECTED
Parainfluenza Virus 2: NOT DETECTED
Parainfluenza Virus 3: NOT DETECTED
RESPIRATORY SYNCYTIAL VIRUS-RVPPCR: NOT DETECTED
Rhinovirus / Enterovirus: NOT DETECTED

## 2017-11-24 LAB — BASIC METABOLIC PANEL
Anion gap: 13 (ref 5–15)
BUN: 19 mg/dL (ref 6–20)
CALCIUM: 8.4 mg/dL — AB (ref 8.9–10.3)
CO2: 28 mmol/L (ref 22–32)
CREATININE: 1.15 mg/dL — AB (ref 0.44–1.00)
Chloride: 97 mmol/L — ABNORMAL LOW (ref 101–111)
GFR calc non Af Amer: 43 mL/min — ABNORMAL LOW (ref >60)
GFR, EST AFRICAN AMERICAN: 50 mL/min — AB (ref >60)
GLUCOSE: 101 mg/dL — AB (ref 65–99)
Potassium: 3.7 mmol/L (ref 3.5–5.1)
Sodium: 138 mmol/L (ref 135–145)

## 2017-11-24 LAB — BRAIN NATRIURETIC PEPTIDE: B NATRIURETIC PEPTIDE 5: 550.6 pg/mL — AB (ref 0.0–100.0)

## 2017-11-24 LAB — TROPONIN I: Troponin I: <0.03 ng/mL (ref <0.03)

## 2017-11-24 MED ORDER — FUROSEMIDE 10 MG/ML IJ SOLN
40.0000 mg | Freq: Every day | INTRAMUSCULAR | Status: DC
  Administered 2017-11-25: 40 mg via INTRAVENOUS
  Filled 2017-11-24: qty 4

## 2017-11-24 MED ORDER — ACETAMINOPHEN 325 MG PO TABS
650.0000 mg | ORAL_TABLET | Freq: Four times a day (QID) | ORAL | Status: DC | PRN
  Administered 2017-11-24 – 2017-12-08 (×28): 650 mg via ORAL
  Filled 2017-11-24 (×29): qty 2

## 2017-11-24 MED ORDER — LEVOTHYROXINE SODIUM 137 MCG PO TABS
135.0000 ug | ORAL_TABLET | Freq: Every day | ORAL | Status: DC
  Administered 2017-11-25: 137 ug via ORAL
  Filled 2017-11-24: qty 1

## 2017-11-24 MED ORDER — FUROSEMIDE 10 MG/ML IJ SOLN
20.0000 mg | Freq: Once | INTRAMUSCULAR | Status: AC
  Administered 2017-11-24: 20 mg via INTRAVENOUS
  Filled 2017-11-24: qty 2

## 2017-11-24 MED ORDER — AMIODARONE HCL 200 MG PO TABS
200.0000 mg | ORAL_TABLET | Freq: Every day | ORAL | Status: DC
  Administered 2017-11-24 – 2017-12-08 (×15): 200 mg via ORAL
  Filled 2017-11-24 (×15): qty 1

## 2017-11-24 MED ORDER — DILTIAZEM HCL ER COATED BEADS 180 MG PO CP24
180.0000 mg | ORAL_CAPSULE | Freq: Every day | ORAL | Status: DC
  Administered 2017-11-24 – 2017-12-02 (×9): 180 mg via ORAL
  Filled 2017-11-24 (×9): qty 1

## 2017-11-24 MED ORDER — LEVOFLOXACIN IN D5W 750 MG/150ML IV SOLN
750.0000 mg | Freq: Once | INTRAVENOUS | Status: AC
  Administered 2017-11-24: 750 mg via INTRAVENOUS
  Filled 2017-11-24: qty 150

## 2017-11-24 MED ORDER — IPRATROPIUM-ALBUTEROL 0.5-2.5 (3) MG/3ML IN SOLN
3.0000 mL | Freq: Once | RESPIRATORY_TRACT | Status: AC
  Administered 2017-11-24: 3 mL via RESPIRATORY_TRACT
  Filled 2017-11-24: qty 3

## 2017-11-24 MED ORDER — ATORVASTATIN CALCIUM 80 MG PO TABS
80.0000 mg | ORAL_TABLET | Freq: Every day | ORAL | Status: DC
  Administered 2017-11-24 – 2017-11-25 (×2): 80 mg via ORAL
  Filled 2017-11-24 (×2): qty 1

## 2017-11-24 MED ORDER — ACETAMINOPHEN 650 MG RE SUPP: 650.0000 mg | Freq: Four times a day (QID) | RECTAL | Status: DC | PRN

## 2017-11-24 MED ORDER — ATENOLOL 50 MG PO TABS
150.0000 mg | ORAL_TABLET | Freq: Every day | ORAL | Status: DC
  Administered 2017-11-25 – 2017-12-02 (×8): 150 mg via ORAL
  Filled 2017-11-24 (×9): qty 3

## 2017-11-24 MED ORDER — APIXABAN 5 MG PO TABS
5.0000 mg | ORAL_TABLET | Freq: Two times a day (BID) | ORAL | Status: DC
  Administered 2017-11-24: 5 mg via ORAL
  Filled 2017-11-24 (×2): qty 1

## 2017-11-24 NOTE — ED Notes (Signed)
Pure wick applied. Patient doesn't appear as sob.

## 2017-11-24 NOTE — ED Provider Notes (Signed)
[facility] EMERGENCY DEPARTMENT Provider Note   CSN: [id number] Arrival date & time: 11/24/17  1039     History   Chief Complaint Chief Complaint  Patient presents with  . Shortness of Breath    HPI [name] is a 83 y.o. female w/ h/o HTN, HLD, CHF (EF 45-50%), atrial fibrillation on eloquis, cardizem and amiodorone, CKD presents to ED for evaluation of shortness of breath x 1 week, worse all the time especially with walking and laying flat. States she sleep sitting up on recliner but that is now also difficulty could not sleep last night. Associated symptoms include dry cough, abdominal swelling, swelling of hands and legs. Thinks it is her heart failure. Reports mechanical fall 1 week ago when getting out of commode, improving right eye brow bruising. Is on lasix 20 mg MWF, has been compliant. Base weight 189#, last checked two weeks ago 200#.  HPI  Past Medical History:  Diagnosis Date  . Anxiety    "some; not treated for it" (11/27/2014)  . Bradycardia   . CAD (coronary artery disease), native coronary artery    2003 normal left main, 80% mid LAD  2.5 x 13 mm Cypher stent to LAD Dr. Rex Kras, subsequent cath 2004 showed patent stent in LAD with no sig disease in RCA or Circ;    . CHF (congestive heart failure) ([place])   . CKD (chronic kidney disease), stage III ([place])   . GERD (gastroesophageal reflux disease)   . History of hiatal hernia   . Hyperlipidemia   . Hypertension   . LBBB (left bundle branch block)   . Migraine    "very rare now" (11/27/2014)  . Obesity (BMI 30-39.9) 06/18/2017  . On home oxygen therapy    "only at night; ? setting" (11/27/2014)  . Osteoarthritis cervical spine   . Sleep apnea 06/18/2017    Patient Active Problem List   Diagnosis Date Noted  . Chronic atrial fibrillation ([place])   . Atrial fibrillation with RVR ([place])   . Hyperkalemia   . Current use of long term anticoagulation 06/18/2017  . Sleep apnea 06/18/2017  .  Obesity (BMI 30-39.9) 06/18/2017  . LBBB (left bundle branch block) 06/18/2017  . CKD (chronic kidney disease), stage III ([place])   . Acute exacerbation of CHF (congestive heart failure) ([place]) 06/17/2017  . Persistent atrial fibrillation ([place])   . Dyspnea 11/27/2014  . Fatigue 11/27/2014  . AKI (acute kidney injury) ([place]) 11/27/2014  . Hyperlipidemia 02/14/2011  . Anxiety state 02/14/2011  . Depressive disorder 02/14/2011  . Hypertensive heart disease without CHF 02/14/2011    Past Surgical History:  Procedure Laterality Date  . ABDOMINAL HYSTERECTOMY    . CATARACT EXTRACTION W/ INTRAOCULAR LENS  IMPLANT, BILATERAL Bilateral   . CYSTOCELE REPAIR      OB History    No data available       Home Medications    Prior to Admission medications   Medication Sig Start Date End Date Taking? Authorizing Provider  acetaminophen (TYLENOL) 325 MG tablet Take 2 tablets (650 mg total) by mouth every 6 (six) hours as needed for mild pain (or Fever >/= 101). 12/02/14   Kelvin Cellar, MD  amiodarone (PACERONE) 200 MG tablet Take 1 tablet (200 mg total) by mouth daily. 06/22/17   Florencia Reasons, MD  apixaban (ELIQUIS) 5 MG TABS tablet Take 1 tablet (5 mg total) by mouth 2 (two) times daily. 12/02/14   Kelvin Cellar, MD  atenolol (TENORMIN)  50 MG tablet Take 50 mg by mouth daily.    [provider]  atorvastatin (LIPITOR) 80 MG tablet Take 1 tablet (80 mg total) by mouth daily. 06/22/17   Florencia Reasons, MD  butalbital-acetaminophen-caffeine (FIORICET, ESGIC) [phone] MG tablet Take 1 tablet by mouth every 4 (four) hours as needed (for headaches).     [provider]  diltiazem (CARDIZEM CD) 180 MG 24 hr capsule Take 180 mg by mouth daily.    [provider]  furosemide (LASIX) 20 MG tablet Take 1 tablet (20 mg total) by mouth every Monday, Wednesday, and Friday. 06/21/17 07/21/17  Florencia Reasons, MD  levothyroxine (SYNTHROID) 50 MCG tablet Take 1 tablet (50 mcg total) by mouth daily.  06/21/17 06/21/18  Florencia Reasons, MD  omeprazole (PRILOSEC OTC) 20 MG tablet Take 20 mg by mouth daily as needed (for reflux).    [provider]  polyethylene glycol (MIRALAX / GLYCOLAX) packet Take 17 g by mouth daily. 06/22/17   Florencia Reasons, MD  senna-docusate (SENOKOT-S) 8.6-50 MG tablet Take 1 tablet by mouth at bedtime. 06/21/17   Florencia Reasons, MD    Family History Family History  Problem Relation Age of Onset  . Heart attack Mother   . Hypertension Mother   . Heart Problems Mother   . Heart attack Father   . Hypertension Father     Social History Social History   Tobacco Use  . Smoking status: Never Smoker  . Smokeless tobacco: Never Used  Substance Use Topics  . Alcohol use: No    Alcohol/week: 0.0 oz  . Drug use: No     Allergies   Ampicillin; Hydromorphone hcl; and Penicillins   Review of Systems Review of Systems  Constitutional: Negative for fever.  Respiratory: Positive for cough and shortness of breath.   Cardiovascular: Positive for leg swelling.       +abdominal swelling +arm swelling   Gastrointestinal: Positive for abdominal distention.  Genitourinary: Negative for difficulty urinating and dysuria.  All other systems reviewed and are negative.    Physical Exam Updated Vital Signs BP 121/79 (BP Location: Right Arm)   Pulse (!) 117   Resp (!) 22   Ht 5' 4.5" (1.638 m)   Wt 90.7 kg (200 lb)   SpO2 100%   BMI 33.80 kg/m   Physical Exam  Constitutional: She is oriented to person, place, and time. She appears well-developed and well-nourished. No distress.  NAD. Non toxic. 98% SpO2 on 2L .   HENT:  Head: Normocephalic and atraumatic.  Right Ear: External ear normal.  Left Ear: External ear normal.  Nose: Nose normal.  Mouth/Throat: Oropharynx is clear and moist.  Eyes: Conjunctivae and EOM are normal. No scleral icterus.  Neck: Normal range of motion. Neck supple.  Cardiovascular: Normal heart sounds. An irregularly irregular rhythm present.  Tachycardia present.  Pulses:      Radial pulses are 1+ on the right side, and 1+ on the left side.       Dorsalis pedis pulses are 1+ on the right side, and 1+ on the left side.  2+ pitting edema to LE, symmetric. 1+ edema to UE. Orthopnea. No obvious JVD, difficult exam due to body habitus.   Pulmonary/Chest: Effort normal. She has decreased breath sounds. She has no wheezes.  Decreased breath sounds to lower/mid lobes bilaterally. No crackles. Tachypnic. Requiring supplemental O2. Drops to 85% on RA.   Musculoskeletal: Normal range of motion. She exhibits no deformity.  Neurological: She is  alert and oriented to person, place, and time.  Skin: Skin is warm and dry. Capillary refill takes less than 2 seconds. Bruising noted.  Ecchymosis to top of right foot with skin abrasion, mildly tender w/o obvious erythema, fluctuance or streaking up leg.   Psychiatric: She has a normal mood and affect. Her behavior is normal. Judgment and thought content normal.  Nursing note and vitals reviewed.    ED Treatments / Results  Labs (all labs ordered are listed, but only abnormal results are displayed) Labs Reviewed  CBC WITH DIFFERENTIAL/PLATELET - Abnormal; Notable for the following components:      Result Value   WBC 12.1 (*)    Hemoglobin 10.5 (*)    HCT 34.1 (*)    MCH 25.9 (*)    RDW 19.3 (*)    Neutro Abs 9.7 (*)    All other components within normal limits  BRAIN NATRIURETIC PEPTIDE - Abnormal; Notable for the following components:   B Natriuretic Peptide 550.6 (*)    All other components within normal limits  BASIC METABOLIC PANEL - Abnormal; Notable for the following components:   Chloride 97 (*)    Glucose, Bld 101 (*)    Creatinine, Ser 1.15 (*)    Calcium 8.4 (*)    GFR calc non Af Amer 43 (*)    GFR calc Af Amer 50 (*)    All other components within normal limits  URINE CULTURE  RESPIRATORY PANEL BY PCR  TROPONIN I  URINALYSIS, ROUTINE W REFLEX MICROSCOPIC    EKG  EKG  Interpretation  Date/Time:  Friday November 24 2017 10:44:35 EST Ventricular Rate:  125 PR Interval:    QRS Duration: 125 QT Interval:  356 QTC Calculation: 514 R Axis:   -101 Text Interpretation:  Atrial fibrillation Ventricular premature complex Nonspecific IVCD with LAD Consider anterior infarct Nonspecific T abnormalities, lateral leads Baseline wander in lead(s) V1 Confirmed by Pattricia Boss [phone]) on 11/24/2017 11:54:28 AM Also confirmed by Pattricia Boss [phone]), editor Philomena Doheny [phone])  on 11/24/2017 12:02:23 PM       Radiology Dg Chest 2 View  Result Date: 11/24/2017 CLINICAL DATA:  83 year old female with bilateral extremity edema for 5 days. Nonproductive cough and increased shortness of breath. EXAM: CHEST  2 VIEW COMPARISON:  06/17/2017 and earlier. FINDINGS: Seated AP upright and lateral views of the chest. Moderate-size veiling opacity in the right lower lung compatible with pleural effusion, increased since July (small at that time). Associated dense right middle and anterior lower lobe region lung opacity. Visible mediastinal contours are stable. No superimposed pneumothorax. Pulmonary vascularity appears stable without overt pulmonary edema. Possible small left pleural effusion. No other confluent pulmonary opacity. Calcified aortic atherosclerosis. Negative visible bowel gas pattern. No acute osseous abnormality identified. IMPRESSION: 1. Moderate size right pleural effusion, probably related to volume overload in this clinical setting, but superimposed right lung base pneumonia is difficult to exclude. 2. No acute pulmonary edema.  Probable small left pleural effusion. 3.  Aortic Atherosclerosis (ICD10-I70.0). Electronically Signed   By: Genevie Ann M.D.   On: 11/24/2017 12:39   Ct Head Wo Contrast  Result Date: 11/24/2017 CLINICAL DATA:  Upper and lower extremity edema for 5 days. Fell 2 weeks ago at home. Healing bruise over the right eye. EXAM: CT HEAD WITHOUT CONTRAST  TECHNIQUE: Contiguous axial images were obtained from the base of the skull through the vertex without intravenous contrast. COMPARISON:  Brain MRI 07/25/2010 FINDINGS: Brain: Stable age related cerebral atrophy,  ventriculomegaly and periventricular white matter disease. Stable remote lacunar type infarct in the top cavum left basal ganglia. No extra-axial fluid collections are identified. No CT findings for acute hemispheric infarction or intracranial hemorrhage. No mass lesions. The brainstem and cerebellum are normal. Vascular: Moderate vascular calcifications but no definite aneurysm or hyperdense vessels. Skull: No skull fracture or bone lesion. Sinuses/Orbits: The paranasal sinuses and mastoid air cells are clear. The globes are intact. Other: No scalp lesions or hematoma. IMPRESSION: Chronic age related changes but no acute findings or mass lesion. Electronically Signed   By: Marijo Sanes M.D.   On: 11/24/2017 12:59    Procedures Procedures (including critical care time)  Medications Ordered in ED Medications  levofloxacin (LEVAQUIN) IVPB 750 mg (not administered)  furosemide (LASIX) injection 20 mg (20 mg Intravenous Given 11/24/17 1321)  ipratropium-albuterol (DUONEB) 0.5-2.5 (3) MG/3ML nebulizer solution 3 mL (3 mLs Nebulization Given 11/24/17 1321)     Initial Impression / Assessment and Plan / ED Course  I have reviewed the triage vital signs and the nursing notes.  Pertinent labs & imaging results that were available during my care of the patient were reviewed by me and considered in my medical decision making (see chart for details).  Clinical Course as of Nov 25 1351  Fri Nov 24, 2017  1250 WBC: (!) 12.1 [CG]  1312 WBC: (!) 12.1 [CG]  1312 Hemoglobin: (!) 10.5 [CG]  1312 B Natriuretic Peptide: (!) 550.6 [CG]  1312 Creatinine: (!) 1.15 [CG]  1312 GFR, Est Non African American: (!) 43 [CG]  1349 FINDINGS: Seated AP upright and lateral views of the chest.  Moderate-size veiling opacity in the right lower lung compatible with pleural effusion, increased since July (small at that time). Associated dense right middle and anterior lower lobe region lung opacity. Visible mediastinal contours are stable. No superimposed pneumothorax. Pulmonary vascularity appears stable without overt pulmonary edema. Possible small left pleural effusion. No other confluent pulmonary opacity. Calcified aortic atherosclerosis. Negative visible bowel gas pattern. No acute osseous abnormality identified.   DG Chest 2 View [CG]    Clinical Course User Index [CG] Kinnie Feil, PA-C   83 year old female with history of atrial fibrillation and CHF (EF 45-50%) presents to ED for evaluation of gradually worsening shortness of breath, orthopnea, extremity edema, abdominal swelling and mild dry cough.  On exam, she is tachycardic and in A. Fib HR fluctuating from 100 to 115, no morning meds today. She is obviously fluid overloaded on exam. Requiring 2 L nasal cannula, desaturates to 85% on room air while at rest.  Lab work remarkable for mild leukocytosis with WBC at 12.1, anemia with hemoglobin at baseline at 10.8. Mild elevation in creatinine in setting up CKD, creatinine 1.15 today not too far off baseline. BNP 550.   Final Clinical Impressions(s) / ED Diagnoses   Spoke to internal medicine teaching service who will evaluate pt in ED and admit for acute CHF exacerbation and likely CAP. Pt given lasix, duoneb, levofloxacin in ED. VS stable. Pt comfortable with 2L  states breathing is improved. Pt shared with SP who agrees with ED tx and plan.  Final diagnoses:  None    ED Discharge Orders    None       Arlean Hopping 11/24/17 1352    Pattricia Boss, MD 11/24/17 1447

## 2017-11-24 NOTE — ED Notes (Signed)
Patient presents to ed via [facility] states c/o upper and lower extremity edema onset 5 days ago, states she is taking her fluid pills however they aren't helping. States she fell 2 weeks ago in her bathroom at home. Healing bruise to right eye. C/o dry hacky cough. Skin is weeping when attempted IV stick. Broken blister to the top of her right foot. Bilateral pedal pulses present. States she is getting more sob.

## 2017-11-24 NOTE — Plan of Care (Signed)
  Education: Knowledge of General Education information will improve 11/24/2017 1722 - Progressing by Lavonna Rua, RN

## 2017-11-24 NOTE — H&P (Signed)
Date: 11/24/2017               Patient Name:  [name] MRN: [id number]  DOB: 1932-07-15 Age / Sex: 83 y.o., female   PCP: Fanny Bien, MD         Medical Service: Internal Medicine Teaching Service         Attending Physician: Dr. Annia Belt, MD    First Contact: Dr. Vickki Muff Pager: [phone]  Second Contact: Dr. Einar Gip Pager: [phone]       After Hours (After 5p/  First Contact Pager: [phone]  weekends / holidays): Second Contact Pager: [phone]   Chief Complaint: SOB, dry cough  History of Present Illness: Pt is an 83 yo female with PMH of CAD, CKD 3, HLD, HTN, OSA, Chronic Afib, HFmrEF, Hypothyroidism.  She presents with 1 week of increasing shortness of breath, orthopnea.  She has not noticed any recent illnesses has had no fever has a chronic nonproductive cough that is worse in the last 2 weeks.  Additionally she notes a weight gain over the past month or so Pt states that her baseline weight is around 183 pounds and she is now about 200.  She noticed that over the last month her Lasix does not seem to be working well and she has decreased urine output, during this time she has noticed increased lower extremity and upper extremity edema.  She lives home alone but has support from her daughter who comes by and helps clean the house and do some of her ADLs.  She cannot really tell when she is in A. fib with RVR and is not noticed a faster rate than usual.  She denies any recent chest pain, she has had no nausea or vomiting or diarrhea.  ED course: Patient arrived severely fluid overloaded, her chest x-ray demonstrated a moderate right sided pleural effusion, or patient had a fall about 2 weeks ago with a healing bruise over the right eye she had no fractures on her head CT or signs of bleeding.  She had one negative troponin, her BMP actually demonstrated a creatinine that was improved from her baseline, her BNP was 550.6.  Her EKG showed atrial  fibrillation with RVR left bundle branch block which is chronic, she was initially given 20 mg IV Lasix.  She additionally had a new O2 requirement and was placed on 2L [place].    Meds:  Current Meds  Medication Sig  . acetaminophen (TYLENOL) 325 MG tablet Take 2 tablets (650 mg total) by mouth every 6 (six) hours as needed for mild pain (or Fever >/= 101).  Marland Kitchen amiodarone (PACERONE) 200 MG tablet Take 1 tablet (200 mg total) by mouth daily.  Marland Kitchen apixaban (ELIQUIS) 5 MG TABS tablet Take 1 tablet (5 mg total) by mouth 2 (two) times daily.  Marland Kitchen atenolol (TENORMIN) 50 MG tablet Take 150 mg by mouth daily. Patient takes 1 1/2 tablet(s) for 150mg  dose daily  . atorvastatin (LIPITOR) 80 MG tablet Take 1 tablet (80 mg total) by mouth daily.  . butalbital-acetaminophen-caffeine (FIORICET, ESGIC) 50-325-40 MG tablet Take 1 tablet by mouth every 4 (four) hours as needed (for headaches).   . diltiazem (CARDIZEM CD) 180 MG 24 hr capsule Take 180 mg by mouth daily.  . furosemide (LASIX) 20 MG tablet Take 1 tablet (20 mg total) by mouth every Monday, Wednesday, and Friday.  . levothyroxine (SYNTHROID) 50 MCG tablet Take 1 tablet (50 mcg total) by  mouth daily. (Patient taking differently: Take 125 mcg by mouth daily. )     Allergies: Allergies as of 11/24/2017 - Review Complete 06/17/2017  Allergen Reaction Noted  . Ampicillin Shortness Of Breath, Swelling, and Rash 02/14/2011  . Hydromorphone hcl Shortness Of Breath, Itching, Swelling, and Rash 02/14/2011  . Penicillins Anaphylaxis 02/14/2011   Past Medical History:  Diagnosis Date  . Anxiety    "some; not treated for it" (11/27/2014)  . Bradycardia   . CAD (coronary artery disease), native coronary artery    2003 normal left main, 80% mid LAD  2.5 x 13 mm Cypher stent to LAD Dr. Rex Kras, subsequent cath 2004 showed patent stent in LAD with no sig disease in RCA or Circ;    . CHF (congestive heart failure) ([place])   . CKD (chronic kidney disease), stage III  ([place])   . GERD (gastroesophageal reflux disease)   . History of hiatal hernia   . Hyperlipidemia   . Hypertension   . LBBB (left bundle branch block)   . Migraine    "very rare now" (11/27/2014)  . Obesity (BMI 30-39.9) 06/18/2017  . On home oxygen therapy    "only at night; ? setting" (11/27/2014)  . Osteoarthritis cervical spine   . Sleep apnea 06/18/2017    Family History:  Family History  Problem Relation Age of Onset  . Heart attack Mother   . Hypertension Mother   . Heart Problems Mother   . Heart attack Father   . Hypertension Father     Social History:  Social History   Socioeconomic History  . Marital status: Divorced    Spouse name: Not on file  . Number of children: 3  . Years of education: college  . Highest education level: Not on file  Social Needs  . Financial resource strain: Not on file  . Food insecurity - worry: Not on file  . Food insecurity - inability: Not on file  . Transportation needs - medical: Not on file  . Transportation needs - non-medical: Not on file  Occupational History  . Occupation: Retired   Tobacco Use  . Smoking status: Never Smoker  . Smokeless tobacco: Never Used  Substance and Sexual Activity  . Alcohol use: No    Alcohol/week: 0.0 oz  . Drug use: No  . Sexual activity: No  Other Topics Concern  . Not on file  Social History Narrative   Drinks tea, occasional coffee    Review of Systems: A complete ROS was negative except as per HPI.   Physical Exam: Blood pressure 115/74, pulse (!) 112, resp. rate (!) 23, height 5' 4.5" (1.638 m), weight 200 lb (90.7 kg), SpO2 98 %. Physical Exam  Constitutional: She is oriented to person, place, and time. She appears well-developed and well-nourished. No distress.  Neck: JVD (to angle of mandible) present.  Cardiovascular: An irregularly irregular rhythm present. Exam reveals distant heart sounds. Exam reveals no gallop and no friction rub.  No murmur heard. Pulmonary/Chest:  Effort normal. No respiratory distress. She has no wheezes. She has rales in the right lower field and the left lower field. She exhibits no tenderness.  Abdominal: Soft. Bowel sounds are normal. She exhibits distension. She exhibits no mass. There is no tenderness. There is no rebound and no guarding.  Musculoskeletal:       Right lower leg: She exhibits edema.       Left lower leg: She exhibits edema.  Neurological: She is alert and oriented  to person, place, and time.  Skin: Rash (LE stasis dermatitis bilaterally) noted. She is not diaphoretic.  Psychiatric: She has a normal mood and affect. Her behavior is normal.    EKG: personally reviewed my interpretation is Afib w/RVR, LBBB chronic  CXR: personally reviewed my interpretation is moderate R pleural effusion, pulm vascular congestion, bronchial thickening.    Assessment & Plan by Problem: Active Problems:   Acute exacerbation of CHF (congestive heart failure) ([place])  Patient Profile: Pt is an 83 yo female with PMH of CAD, CKD 3, HLD, HTN, OSA, Chronic Afib, HFmrEF, Hypothyroidism, presenting with acute on chronic HFmrEF with severe volume overload.      Acute on chronic HFmrEF: last ECHO in 06/18/17 showed moderate LVH, EF 45-50%, moderate to severe MR, PA pressure mildly to moderately increased.  Presented volume overloaded was on 20mg  PO lasix MWF noticed decreased urine output over the last month.    -Will add additional 20mg  IV lasix this evening -repeat ECHO -wound care for stasis ulcers/dermatitis -[facility] boots tomorrow   CKD III: pts creatinine better than baseline at 1.15 on admission, baseline appears around 1.6  -follow BMP   Afib: permantent,  on eliquis, amiodarone 200mg  po daily, diltiazem 180mg , atenolol 150mg , in RVR on arrival but did not take these medicines this morning  -restart home meds above  Hypothyroidism: was subtherapeutic on synthroid 50 in July, dosage increased to 125 but pt never followed up for  recheck  -will recheck TSH, T4  Dispo: Admit patient to Inpatient with expected length of stay greater than 2 midnights.  Signed: Katherine Roan, MD 11/24/2017, 4:15 PM  Vickki Muff MD PGY-1 Internal Medicine Pager # [phone]

## 2017-11-25 ENCOUNTER — Inpatient Hospital Stay (HOSPITAL_COMMUNITY): Payer: Medicare Other

## 2017-11-25 ENCOUNTER — Encounter (HOSPITAL_COMMUNITY): Payer: Self-pay | Admitting: *Deleted

## 2017-11-25 ENCOUNTER — Other Ambulatory Visit: Payer: Self-pay

## 2017-11-25 DIAGNOSIS — I5022 Chronic systolic (congestive) heart failure: Secondary | ICD-10-CM | POA: Diagnosis present

## 2017-11-25 DIAGNOSIS — J90 Pleural effusion, not elsewhere classified: Secondary | ICD-10-CM

## 2017-11-25 DIAGNOSIS — E039 Hypothyroidism, unspecified: Secondary | ICD-10-CM | POA: Diagnosis present

## 2017-11-25 LAB — BASIC METABOLIC PANEL
Anion gap: 10 (ref 5–15)
BUN: 16 mg/dL (ref 6–20)
CHLORIDE: 97 mmol/L — AB (ref 101–111)
CO2: 30 mmol/L (ref 22–32)
CREATININE: 1.12 mg/dL — AB (ref 0.44–1.00)
Calcium: 8 mg/dL — ABNORMAL LOW (ref 8.9–10.3)
GFR calc Af Amer: 51 mL/min — ABNORMAL LOW (ref >60)
GFR calc non Af Amer: 44 mL/min — ABNORMAL LOW (ref >60)
Glucose, Bld: 97 mg/dL (ref 65–99)
POTASSIUM: 3.4 mmol/L — AB (ref 3.5–5.1)
SODIUM: 137 mmol/L (ref 135–145)

## 2017-11-25 LAB — ECHOCARDIOGRAM COMPLETE
Height: 64 in
Weight: 3269.86 oz

## 2017-11-25 LAB — CBC
HEMATOCRIT: 29.3 % — AB (ref 36.0–46.0)
Hemoglobin: 9.2 g/dL — ABNORMAL LOW (ref 12.0–15.0)
MCH: 26.6 pg (ref 26.0–34.0)
MCHC: 31.4 g/dL (ref 30.0–36.0)
MCV: 84.7 fL (ref 78.0–100.0)
Platelets: 239 10*3/uL (ref 150–400)
RBC: 3.46 MIL/uL — ABNORMAL LOW (ref 3.87–5.11)
RDW: 19.5 % — AB (ref 11.5–15.5)
WBC: 9.8 10*3/uL (ref 4.0–10.5)

## 2017-11-25 LAB — TSH: TSH: 108.169 u[IU]/mL — ABNORMAL HIGH (ref 0.350–4.500)

## 2017-11-25 LAB — MAGNESIUM: Magnesium: 1.7 mg/dL (ref 1.7–2.4)

## 2017-11-25 MED ORDER — POTASSIUM CHLORIDE CRYS ER 20 MEQ PO TBCR
20.0000 meq | EXTENDED_RELEASE_TABLET | Freq: Two times a day (BID) | ORAL | Status: AC
  Administered 2017-11-25: 20 meq via ORAL
  Filled 2017-11-25: qty 1

## 2017-11-25 MED ORDER — FUROSEMIDE 10 MG/ML IJ SOLN
40.0000 mg | Freq: Two times a day (BID) | INTRAMUSCULAR | Status: DC
  Administered 2017-11-25 – 2017-11-26 (×2): 40 mg via INTRAVENOUS
  Filled 2017-11-25 (×2): qty 4

## 2017-11-25 MED ORDER — APIXABAN 2.5 MG PO TABS
2.5000 mg | ORAL_TABLET | Freq: Two times a day (BID) | ORAL | Status: DC
  Administered 2017-11-25 – 2017-12-08 (×27): 2.5 mg via ORAL
  Filled 2017-11-25 (×26): qty 1

## 2017-11-25 MED ORDER — LEVOTHYROXINE SODIUM 75 MCG PO TABS
150.0000 ug | ORAL_TABLET | Freq: Every day | ORAL | Status: DC
  Administered 2017-11-26 – 2017-11-28 (×3): 150 ug via ORAL
  Filled 2017-11-25 (×3): qty 2

## 2017-11-25 NOTE — Evaluation (Signed)
Physical Therapy Evaluation Patient Details Name: [name] MRN: [id number] DOB: 1932-08-26 Today's Date: 11/25/2017   History of Present Illness  Pt is an 83 yo female with PMH of CAD, CKD 3, HLD, HTN, OSA, Chronic Afib, HFmrEF, and Hypothyroidism.  She presented  with 1 week of increasing shortness of breath, orthopnea. Pt admitted with dx of CHF.   Clinical Impression  Pt admitted with above diagnosis. Pt currently with functional limitations due to the deficits listed below (see PT Problem List). PTA, pt independent with all functional mobility. On eval, pt required min assist bed mobility and sit to stand transfers. Mobility limited by fatigue and weakness.  Pt reports she is still very tired today and requesting return to bed. Pt will benefit from skilled PT to increase their independence and safety with mobility to allow discharge to the venue listed below.       Follow Up Recommendations Home health PT;Supervision/Assistance - 24 hour    Equipment Recommendations  None recommended by PT    Recommendations for Other Services       Precautions / Restrictions Precautions Precautions: Fall      Mobility  Bed Mobility Overal bed mobility: Needs Assistance Bed Mobility: Supine to Sit;Sit to Supine     Supine to sit: Min assist Sit to supine: Min assist   General bed mobility comments: +rail, increased time and effort  Transfers Overall transfer level: Needs assistance Equipment used: None Transfers: Sit to/from Stand Sit to Stand: Min assist            Ambulation/Gait             General Gait Details: unable due to fatigue  Stairs            Wheelchair Mobility    Modified Rankin (Stroke Patients Only)       Balance                                             Pertinent Vitals/Pain Pain Assessment: No/denies pain    Home Living Family/patient expects to be discharged to:: Private residence Living Arrangements:  Alone Available Help at Discharge: Family;Friend(s);Available 24 hours/day Type of Home: House Home Access: Stairs to enter Entrance Stairs-Rails: Right;Left;Can reach both Entrance Stairs-Number of Steps: 5 Home Layout: One level Home Equipment: Clinical cytogeneticist - 4 wheels Additional Comments: Pt fell at home 1.5 weeks prior to admission sustaining injury to R brow bone.  Pt's son and DIL have been staying with her since the fall. DIL brought pt a rollator but she has not been using it.     Prior Function Level of Independence: Independent               Hand Dominance   Dominant Hand: Right    Extremity/Trunk Assessment   Upper Extremity Assessment Upper Extremity Assessment: Generalized weakness    Lower Extremity Assessment Lower Extremity Assessment: Generalized weakness    Cervical / Trunk Assessment Cervical / Trunk Assessment: Normal  Communication   Communication: HOH  Cognition Arousal/Alertness: Awake/alert Behavior During Therapy: WFL for tasks assessed/performed Overall Cognitive Status: Within Functional Limits for tasks assessed                                        General  Comments      Exercises     Assessment/Plan    PT Assessment Patient needs continued PT services  PT Problem List Decreased strength;Decreased mobility;Decreased safety awareness;Decreased knowledge of precautions;Decreased activity tolerance;Cardiopulmonary status limiting activity;Decreased knowledge of use of DME       PT Treatment Interventions DME instruction;Therapeutic activities;Gait training;Therapeutic exercise;Patient/family education;Stair training;Balance training;Functional mobility training    PT Goals (Current goals can be found in the Care Plan section)  Acute Rehab PT Goals Patient Stated Goal: feel better PT Goal Formulation: With patient Time For Goal Achievement: 12/09/17 Potential to Achieve Goals: Good    Frequency Min  3X/week   Barriers to discharge        Co-evaluation               AM-PAC PT "6 Clicks" Daily Activity  Outcome Measure Difficulty turning over in bed (including adjusting bedclothes, sheets and blankets)?: A Little Difficulty moving from lying on back to sitting on the side of the bed? : Unable Difficulty sitting down on and standing up from a chair with arms (e.g., wheelchair, bedside commode, etc,.)?: Unable Help needed moving to and from a bed to chair (including a wheelchair)?: A Little Help needed walking in hospital room?: A Little Help needed climbing 3-5 steps with a railing? : A Lot 6 Click Score: 13    End of Session Equipment Utilized During Treatment: Gait belt;Oxygen Activity Tolerance: Patient limited by fatigue Patient left: in bed;with call bell/phone within reach;with bed alarm set;Other (comment)(ortho tech in room to apply unna boots) Nurse Communication: Mobility status PT Visit Diagnosis: Other abnormalities of gait and mobility (R26.89);Muscle weakness (generalized) (M62.81)    Time: 2671-2458 PT Time Calculation (min) (ACUTE ONLY): 15 min   Charges:   PT Evaluation $PT Eval Low Complexity: 1 Low     PT G Codes:        Lorrin Goodell, PT  Office # [phone] Pager [phone]   Lorriane Shire 11/25/2017, 2:46 PM

## 2017-11-25 NOTE — Progress Notes (Signed)
Orthopedic Tech Progress Note Patient Details:  [name] [birth year] [id number]  Ortho Devices Ortho Device/Splint Location: Bilateral unna boots Ortho Device/Splint Interventions: Application   Post Interventions Patient Tolerated: Well Instructions Provided: Care of device   [place] Pink 11/25/2017, 2:06 PM

## 2017-11-25 NOTE — Plan of Care (Signed)
  Education: Knowledge of General Education information will improve 11/25/2017 [phone] - Completed/Met by Evert Kohl, RN   Health Behavior/Discharge Planning: Ability to manage health-related needs will improve 11/25/2017 0649 - Completed/Met by Evert Kohl, RN   Clinical Measurements: Ability to maintain clinical measurements within normal limits will improve 11/25/2017 0649 - Completed/Met by Evert Kohl, RN

## 2017-11-25 NOTE — Consult Note (Addendum)
[facility] Nurse wound consult note Reason for Consult: Consult requested for bilat legs. Assessed with primary team at the bedside.  Pt has generalized edema to bilat feet and legs, more on the left side.  There are patchy areas of red petechia scattered to bilat calves.  Right anterior foot with a partial thickness wound from a previous blister which has ruptured. 4X4X.1cm, dark red and moist, small amt tan drainage, no odor. Dressing procedure/placement/frequency: Primary team has recommended Una boots to control edema.  Paged ortho tech to apply and change Q Sat.  Pt will need home health follow-up for continued compression wraps; please order upon discharge. Please re-consult if further assistance is needed.  Thank-you,  Julien Girt MSN, [place], [place], [place], [place]

## 2017-11-25 NOTE — Consult Note (Signed)
Cardiology Consult Note  Admit date: 11/24/2017 Name: [name] 83 y.o.  female DOB:  1932/06/13 MRN:  [id number]  Today's date:  11/25/2017  Referring Physician:   Teaching service   Primary Physician:    Dr. Rachell Cipro   Reason for Consultation:    Atrial fibrillation and heart failure   IMPRESSIONS: 1.  Chronic atrial fibrillation 2.  Profound hypothyroidism still not adequately replaced 3.  Long-term use of anticoagulation 4.  Combined systolic and diastolic heart failure 5.  Medical noncompliance 6.  History of CAD with stenting  RECOMMENDATION: She has had repeated episodes of noncompliance with follow-up in my office.  At the present time she is severely hypothyroid and needs to be on a higher dose of Synthroid.  She doesn't currently have her medicines with her according to our records she was supposed to be a 125 g but the bottle says 50 g.  She might need to have IV Synthroid given while she is in the hospital and its can be difficult to cardiovert her unless her thyroid is in better shape than it is now because of the anesthetic risk.  She needs to be diuresed while she is in the hospital.  I would repeat the echocardiogram while she is in.  HISTORY: This 83 year old female is seen at the request of internal medicine for evaluation of atrial fibrillation and heart failure.  The patient has a prior history of coronary artery disease and has a previous stent of the LAD.  She was taking care of by Dr. Rex Kras and then changed to Dr. Nadyne Coombes and has seen several cardiologists until she switched to me in 2015.  I last saw her in April of this year at which point in time she presented with worsening shortness of breath dyspnea and was found to be in rapid atrial fibrillation.  During that visit she was initiated on amiodarone therapy.  She had previously been on Eliquis without missing doses.  I had planned to see her back in 2 weeks however she canceled that appointment and  despite being reminded 3 times by my office never scheduled a follow-up appointment.  She states that her problem started in the early part of the year which she had the flu and developed fatigue shortness of breath some diarrhea.  She has intermittent diarrhea and nausea ever since she saw me in April and was readmitted to the hospital in July and found to be profoundly hypothyroid as well as with rapid atrial fibrillation.  She was diuresed during that admission and was feeling much better.  She did not return to see me until about 2 months later when she came to the office on September 20 and was in atrial fibrillation.  Her TSH earlier that month was 121.  She failed to return after that visit and she states it is due to the fact that her daughter and states full has some mental illness.  She has had progressive weakness weight gain and development peripheral edema as well as ulceration and was readmitted to the hospital with worsening heart failure.  She does state that she has been taking her throat at home although it is entirely unclear to me exactly what dose she has been taking.  I was asked to consult on her.  Past Medical History:  Diagnosis Date  . Anxiety    "some; not treated for it" (11/27/2014)  . Bradycardia   . CAD (coronary artery disease), native coronary artery  2003 normal left main, 80% mid LAD  2.5 x 13 mm Cypher stent to LAD Dr. Rex Kras, subsequent cath 2004 showed patent stent in LAD with no sig disease in RCA or Circ;    . CHF (congestive heart failure) ([place])   . CKD (chronic kidney disease), stage III ([place])   . GERD (gastroesophageal reflux disease)   . History of hiatal hernia   . Hyperlipidemia   . Hypertension   . LBBB (left bundle branch block)   . Migraine    "very rare now" (11/27/2014)  . Obesity (BMI 30-39.9) 06/18/2017  . On home oxygen therapy    "only at night; ? setting" (11/27/2014)  . Osteoarthritis cervical spine   . Sleep apnea 06/18/2017      Past  Surgical History:  Procedure Laterality Date  . ABDOMINAL HYSTERECTOMY    . CATARACT EXTRACTION W/ INTRAOCULAR LENS  IMPLANT, BILATERAL Bilateral   . CYSTOCELE REPAIR      Allergies:  is allergic to ampicillin; hydromorphone hcl; and penicillins.   Medications: Prior to Admission medications   Medication Sig Start Date End Date Taking? Authorizing Provider  acetaminophen (TYLENOL) 325 MG tablet Take 2 tablets (650 mg total) by mouth every 6 (six) hours as needed for mild pain (or Fever >/= 101). 12/02/14  Yes Kelvin Cellar, MD  amiodarone (PACERONE) 200 MG tablet Take 1 tablet (200 mg total) by mouth daily. 06/22/17  Yes Florencia Reasons, MD  apixaban (ELIQUIS) 5 MG TABS tablet Take 1 tablet (5 mg total) by mouth 2 (two) times daily. 12/02/14  Yes Kelvin Cellar, MD  atorvastatin (LIPITOR) 80 MG tablet Take 1 tablet (80 mg total) by mouth daily. 06/22/17  Yes Florencia Reasons, MD  butalbital-acetaminophen-caffeine (FIORICET, ESGIC) [id number] MG tablet Take 1 tablet by mouth every 4 (four) hours as needed (for headaches).    Yes [provider]  diltiazem (CARDIZEM CD) 180 MG 24 hr capsule Take 180 mg by mouth daily.   Yes [provider]  furosemide (LASIX) 20 MG tablet Take 1 tablet (20 mg total) by mouth every Monday, Wednesday, and Friday. 06/21/17 11/24/17 Yes Florencia Reasons, MD  levothyroxine (SYNTHROID) 50 MCG tablet Take 1 tablet (50 mcg total) by mouth daily. Patient taking differently: Take 125 mcg by mouth daily.  06/21/17 06/21/18 Yes Florencia Reasons, MD  atenolol (TENORMIN) 50 MG tablet Take 150 mg by mouth daily. Patient takes 1 1/2 tablet(s) for 150mg  dose daily    [provider]  omeprazole (PRILOSEC OTC) 20 MG tablet Take 20 mg by mouth daily as needed (for reflux).    [provider]  polyethylene glycol (MIRALAX / GLYCOLAX) packet Take 17 g by mouth daily. 06/22/17   Florencia Reasons, MD  senna-docusate (SENOKOT-S) 8.6-50 MG tablet Take 1 tablet by mouth at bedtime. 06/21/17   Florencia Reasons, MD    Family History: Family Status  Relation Name Status  . Mother  Deceased  . Father  Deceased   Social History:   reports that  has never smoked. she has never used smokeless tobacco. She reports that she does not drink alcohol or use drugs.   Social History   Social History Narrative   Drinks tea, occasional coffee    Review of Systems: Significant malaise and fatigue, significant anxiety, urinary frequency, orthopnea and PND, mild nausea, severe weakness, ataxia with difficult gait.  Other than as noted above the remainder of the review of systems is unremarkable.  Physical Exam: BP 130/76 (BP Location: Right  Arm)   Pulse 97   Temp 97.6 F (36.4 C) (Oral)   Resp (!) 1   Ht 5\' 4"  (1.626 m)   Wt 92.7 kg (204 lb 5.9 oz)   SpO2 97%   BMI 35.08 kg/m   General appearance: Obese elderly white female in no acute distress  Skin: Occasional ecchymoses noted, small ulcer on leg  Head: Normocephalic, without obvious abnormality, Bruits present on her right orbital area Eyes: conjunctivae/corneas clear. PERRL, EOM's intact. Fundi not examined Neck: no adenopathy, no carotid bruit, no JVD and supple, symmetrical, trachea midline Lungs: Crackles at the bases Heart: Irregular rhythm, normal S1-S2 no S3, no murmur  Abdomen: soft, non-tender; bowel sounds normal; no masses,  no organomegaly Extremities: Mild rash on lower extremities, 3-4+ edema Pulses: 2+ and symmetric Neurologic: Grossly normal Psych: Alert and oriented x 3 Labs: CBC Recent Labs    11/24/17 1155 11/25/17 0413  WBC 12.1* 9.8  RBC 4.06 3.46*  HGB 10.5* 9.2*  HCT 34.1* 29.3*  PLT 315 239  MCV 84.0 84.7  MCH 25.9* 26.6  MCHC 30.8 31.4  RDW 19.3* 19.5*  LYMPHSABS 1.8  --   MONOABS 0.7  --   EOSABS 0.0  --   BASOSABS 0.0  --    CMP  Recent Labs    11/25/17 0413  NA 137  K 3.4*  CL 97*  CO2 30  GLUCOSE 97  BUN 16  CREATININE 1.12*  CALCIUM 8.0*  GFRNONAA 44*  GFRAA 51*   BNP (last 3  results) BNP    Component Value Date/Time   BNP 550.6 (H) 11/24/2017 1155   Cardiac Panel (last 3 results)  Recent Labs    11/24/17 [place] <0.03     Radiology:  Moderate right pleural effusion  EKG: Atrial fibrillation with left bundle branch block and somewhat rapid response Independently reviewed by me  Signed:  W. Doristine Church MD [facility]   Cardiology Consultant  11/25/2017, 12:32 PM  c

## 2017-11-25 NOTE — Plan of Care (Signed)
  Education: Knowledge of General Education information will improve 11/25/2017 [phone] - Completed/Met by Evert Kohl, RN   Health Behavior/Discharge Planning: Ability to manage health-related needs will improve 11/25/2017 0649 - Completed/Met by Evert Kohl, RN   Clinical Measurements: Ability to maintain clinical measurements within normal limits will improve 11/25/2017 0649 - Completed/Met by Evert Kohl, RN   Education: Ability to demonstrate management of disease process will improve 11/25/2017 0650 - Completed/Met by Evert Kohl, RN

## 2017-11-25 NOTE — Progress Notes (Signed)
   Subjective: [name] says she is doing fine today, not complaining of shortness of breath, no chest pain or abdominal pain, nausea or vomiting.  She is thankful for the help we explained how we are trying to get the fluid off of her and that Dr. Wynonia Lawman her cardiologist would be stopping by today.    Objective:  Vital signs in last 24 hours: Vitals:   11/24/17 2100 11/25/17 0200 11/25/17 0442 11/25/17 0700  BP: 122/71 124/76 133/74 130/76  Pulse: 89 82 85 97  Resp: 18 20 (!) 1   Temp: 97.9 F (36.6 C) (!) 97.4 F (36.3 C) 97.6 F (36.4 C)   TempSrc: Oral Oral Oral   SpO2: 98% 95% 97%   Weight:   204 lb 5.9 oz (92.7 kg)   Height:       Physical Exam  Constitutional: She is oriented to person, place, and time. She appears well-developed and well-nourished. No distress.  Neck: JVD (11cm H2O) present.  Cardiovascular: An irregularly irregular rhythm present. Exam reveals 2/6 systolic murmur. Exam reveals no gallop and no friction rub.  No murmur heard. Pulmonary/Chest: Effort normal. No respiratory distress. She has no wheezes. She has rales in the right lower field and the left lower field. She exhibits no tenderness.  Abdominal: Soft. Bowel sounds are normal. She exhibits distension. She exhibits no mass. There is no tenderness. There is no rebound and no guarding.  Musculoskeletal:       Right lower leg: She exhibits edema.       Left lower leg: She exhibits edema.  Neurological: She is alert and oriented to person, place, and time.  Skin: Rash (LE stasis dermatitis bilaterally) noted. She is not diaphoretic.  Psychiatric: She has a normal mood and affect. Her behavior is normal.     Assessment/Plan:  Principal Problem:   Acute exacerbation of CHF (congestive heart failure) (HCC)  Acute on chronic HFmrEF: last ECHO in 06/18/17 showed moderate LVH, EF 45-50%, moderate to severe MR, PA pressure mildly to moderately increased.  Presented volume overloaded was on 20mg  PO lasix  MWF noticed decreased urine output over the last month.     -Lasix IV 40mg  BID -strict I's/O's, daily weights -repeat ECHO shows EF 40-45%, no wall motion abnormalities, increased TR and RA dilation from prior ECHO -wound care for stasis ulcers/dermatitis -UNNA boots    CKD III: pts creatinine better than baseline at 1.15 on admission, baseline appears around 1.6   -follow BMP   Afib: permanent,  on eliquis, amiodarone 200mg  po daily, diltiazem 180mg , atenolol 150mg    -restart home meds above -eliquis switched to 2.5mg  BID due to age and renal function   Hypothyroidism: was subtherapeutic on synthroid 50 in July, dosage increased to 125 but pt never followed up for recheck   -repeat TSH still very elevated -started on synthroid 141mcg daily   Dispo: Anticipated discharge in approximately 2-3 day(s).   Katherine Roan, MD 11/25/2017, 12:05 PM Vickki Muff MD PGY-1 Internal Medicine Pager # [phone]

## 2017-11-25 NOTE — Progress Notes (Signed)
  Echocardiogram 2D Echocardiogram has been performed.  [name] 11/25/2017, 1:49 PM

## 2017-11-26 LAB — BASIC METABOLIC PANEL
Anion gap: 7 (ref 5–15)
BUN: 19 mg/dL (ref 6–20)
CALCIUM: 8.2 mg/dL — AB (ref 8.9–10.3)
CO2: 30 mmol/L (ref 22–32)
CREATININE: 1.49 mg/dL — AB (ref 0.44–1.00)
Chloride: 99 mmol/L — ABNORMAL LOW (ref 101–111)
GFR, EST AFRICAN AMERICAN: 36 mL/min — AB (ref >60)
GFR, EST NON AFRICAN AMERICAN: 31 mL/min — AB (ref >60)
Glucose, Bld: 118 mg/dL — ABNORMAL HIGH (ref 65–99)
Potassium: 4.3 mmol/L (ref 3.5–5.1)
SODIUM: 136 mmol/L (ref 135–145)

## 2017-11-26 LAB — CBC
HCT: 32.2 % — ABNORMAL LOW (ref 36.0–46.0)
Hemoglobin: 9.7 g/dL — ABNORMAL LOW (ref 12.0–15.0)
MCH: 26.3 pg (ref 26.0–34.0)
MCHC: 30.1 g/dL (ref 30.0–36.0)
MCV: 87.3 fL (ref 78.0–100.0)
PLATELETS: 263 10*3/uL (ref 150–400)
RBC: 3.69 MIL/uL — ABNORMAL LOW (ref 3.87–5.11)
RDW: 19.4 % — AB (ref 11.5–15.5)
WBC: 8.6 10*3/uL (ref 4.0–10.5)

## 2017-11-26 MED ORDER — FUROSEMIDE 10 MG/ML IJ SOLN
80.0000 mg | Freq: Two times a day (BID) | INTRAMUSCULAR | Status: DC
  Administered 2017-11-26: 80 mg via INTRAVENOUS
  Filled 2017-11-26: qty 8

## 2017-11-26 MED ORDER — ATORVASTATIN CALCIUM 20 MG PO TABS
60.0000 mg | ORAL_TABLET | Freq: Every day | ORAL | Status: DC
  Administered 2017-11-26 – 2017-12-07 (×12): 60 mg via ORAL
  Filled 2017-11-26 (×12): qty 1

## 2017-11-26 NOTE — Plan of Care (Signed)
  Skin Integrity: Risk for impaired skin integrity will decrease 11/26/2017 0818 - Completed/Met by Evert Kohl, RN

## 2017-11-26 NOTE — Progress Notes (Signed)
Medicine attending: We appreciate ongoing assistance from cardiology.  Clinical status and database reviewed with resident physician Dr. Katherine Roan and I concur with his evaluation and management plan which we discussed together. She is improving slowly with only moderate diuresis and we will increase her diuretics today.  I am concerned that some of her fluid accumulation is directly related to her hypothyroidism and that increasing diuretic intensity may not increase the therapeutic effect.  This is being addressed in her replacement dose has been increased but may take time to show an effect.   With respect to her chronic atrial fibrillation, Dr. Shan Levans suggested that the patient could be considered for AV nodal ablation and biventricular pacemaker placement given left bundle branch block and likely permanent atrial fibrillation as an alternative to cardioversion which may only give a temporary reversion to sinus rhythm.  We will discuss this idea with cardiology.

## 2017-11-26 NOTE — Progress Notes (Signed)
Subjective: [name] says she is doing fine today, not complaining of shortness of breath, no chest pain or abdominal pain, nausea or vomiting.  We talked a little about how her urine output was not very good yesterday despite the increase in Lasix.     Objective:  Vital signs in last 24 hours: Vitals:   11/25/17 0700 11/25/17 1240 11/25/17 2021 11/26/17 0638  BP: 130/76 108/60 112/70 128/88  Pulse: 97 77 78 76  Resp:  18 18 18   Temp:  98.3 F (36.8 C) 98.4 F (36.9 C) (!) 97.3 F (36.3 C)  TempSrc:  Oral Oral Oral  SpO2:  98% 93% 95%  Weight:    202 lb 9.6 oz (91.9 kg)  Height:       Physical Exam  Constitutional: She is oriented to person, place, and time. She appears well-developed and well-nourished. No distress.  Neck: JVD difficult to assess today  Cardiovascular: An irregularly irregular rhythm present. Exam reveals 2/6 systolic murmur. Exam reveals no gallop and no friction rub.  No murmur heard. Pulmonary/Chest: Effort normal. No respiratory distress. She has no wheezes. She has rales in the right lower field and the left lower field. She exhibits no tenderness.  Abdominal: Soft. Bowel sounds are normal. She exhibits distension. She exhibits no mass. There is no tenderness. There is no rebound and no guarding.  Musculoskeletal:       Right lower leg: She exhibits edema.       Left lower leg: She exhibits edema.  Neurological: She is alert and oriented to person, place, and time.  Skin: Rash (LE stasis dermatitis bilaterally) noted. She is not diaphoretic.  Psychiatric: She has a normal mood and affect. Her behavior is normal.     Assessment/Plan:  Principal Problem:   Acute exacerbation of CHF (congestive heart failure) (HCC) Active Problems:   Chronic atrial fibrillation with RVR (HCC)   Acute on chronic systolic heart failure (HCC)   Hypothyroidism   Myxedema   Pleural effusion, right  Acute on chronic HFmrEF: ischemic cardiomyopathy, LAD stented in  2004, last ECHO in 06/18/17 showed moderate LVH, EF 45-50%, moderate to severe MR, PA pressure mildly to moderately increased.  Presented volume overloaded was on 20mg  PO lasix MWF noticed decreased urine output over the last month.     -Lasix IV 40mg  BID yesterday, however pt only put out 1.8L with net neg 600cc, weight stable -will increase diuretics today -strict I's/O's, daily weights -repeat ECHO shows EF 40-45%, no wall motion abnormalities, increased TR and RA dilation from prior ECHO -wound care for stasis ulcers/dermatitis -UNNA boots    CKD III: pts creatinine better than baseline at 1.15 on admission, baseline appears around 1.6   -follow BMP   Afib: chronic, likely permanent,  on eliquis, amiodarone 200mg  po daily, diltiazem 180mg , atenolol 150mg    -restart home meds above -eliquis switched to 2.5mg  BID due to age and renal function -will need better control of hypothyroidism, which is complicated by amiodarone toxicity, also likely has OSA needs formal sleep study on discharge and would benefit from CPAP -Even with CPAP and euthyroid state, I am unsure she will stay out of afib for long with cardioversion given significant valvular disease and dilated heart.   -Would EP consider AV nodal ablation with BiV pacemaker given LBBB and likely permanent afib?   Hypothyroidism: was subtherapeutic on synthroid 50 in July, dosage increased to 125 but pt never followed up for recheck   -repeat TSH still  very elevated -started on synthroid 123mcg daily   Dispo: Anticipated discharge in approximately 2-3 day(s).   Mikayla Roan, MD 11/26/2017, 11:08 AM Vickki Muff MD PGY-1 Internal Medicine Pager # [phone]

## 2017-11-26 NOTE — Progress Notes (Signed)
Subjective:  She thinks her breathing is a little better today.  No real change in weight and some increase in urinary output but still not brisk.  Atrial fibrillation rate fairly well controlled.  Some increase in creatinine today.  Objective:  Vital Signs in the last 24 hours: BP 127/69 (BP Location: Right Arm)   Pulse 77   Temp 98.6 F (37 C) (Oral)   Resp 18   Ht 5\' 4"  (1.626 m)   Wt 91.9 kg (202 lb 9.6 oz)   SpO2 97%   BMI 34.78 kg/m   Physical Exam: Elderly obese white female somewhat hard of hearing Lungs:  Reduced breath sounds  Cardiac:  Irregular rhythm, normal S1 and S2, no S3, 2/6 systolic murmur at apex Extremities:  Both legs are wrapped, some edema present of toes still.    Intake/Output from previous day: 12/29 0701 - 12/30 0700 In: 1200 [P.O.:1200] Out: 1800 [Urine:1800] Weight Filed Weights   11/24/17 1714 11/25/17 0442 11/26/17 0638  Weight: 92.2 kg (203 lb 4.2 oz) 92.7 kg (204 lb 5.9 oz) 91.9 kg (202 lb 9.6 oz)    Lab Results: Basic Metabolic Panel: Recent Labs    11/25/17 0413 11/26/17 0414  NA 137 136  K 3.4* 4.3  CL 97* 99*  CO2 30 30  GLUCOSE 97 118*  BUN 16 19  CREATININE 1.12* 1.49*    CBC: Recent Labs    11/24/17 1155 11/25/17 0413 11/26/17 0414  WBC 12.1* 9.8 8.6  NEUTROABS 9.7*  --   --   HGB 10.5* 9.2* 9.7*  HCT 34.1* 29.3* 32.2*  MCV 84.0 84.7 87.3  PLT 315 239 263    BNP    Component Value Date/Time   BNP 550.6 (H) 11/24/2017 1155    Cardiac Panel (last 3 results) Recent Labs    11/24/17 1155  TROPONINI <0.03   Telemetry: Atrial fibrillation currently rate controlled  Assessment/Plan:  1.  Acute on chronic combined systolic and diastolic heart failure 2.  Profound hypothyroidism 3.  Chronic atrial fibrillation currently rate controlled 4.  Long-term use of anticoagulation 5.  Medical noncompliance 6.  Slight increase in creatinine will need to watch  Recommendations:  Agree with increase in  diuretics.  Continue to follow renal function.  Continue to try to replete thyroid at higher dose.  Will follow with you.     Kerry Hough  MD [facility]  11/26/2017, 1:09 PM

## 2017-11-27 LAB — BASIC METABOLIC PANEL
Anion gap: 13 (ref 5–15)
BUN: 23 mg/dL — AB (ref 6–20)
CHLORIDE: 91 mmol/L — AB (ref 101–111)
CO2: 28 mmol/L (ref 22–32)
CREATININE: 1.38 mg/dL — AB (ref 0.44–1.00)
Calcium: 7.8 mg/dL — ABNORMAL LOW (ref 8.9–10.3)
GFR calc Af Amer: 40 mL/min — ABNORMAL LOW (ref >60)
GFR calc non Af Amer: 34 mL/min — ABNORMAL LOW (ref >60)
Glucose, Bld: 107 mg/dL — ABNORMAL HIGH (ref 65–99)
Potassium: 4.7 mmol/L (ref 3.5–5.1)
SODIUM: 132 mmol/L — AB (ref 135–145)

## 2017-11-27 LAB — HEPATIC FUNCTION PANEL
ALK PHOS: 75 U/L (ref 38–126)
ALT: 27 U/L (ref 14–54)
AST: 42 U/L — ABNORMAL HIGH (ref 15–41)
Albumin: 2.4 g/dL — ABNORMAL LOW (ref 3.5–5.0)
BILIRUBIN DIRECT: 0.3 mg/dL (ref 0.1–0.5)
BILIRUBIN INDIRECT: 0.4 mg/dL (ref 0.3–0.9)
BILIRUBIN TOTAL: 0.7 mg/dL (ref 0.3–1.2)
Total Protein: 5.2 g/dL — ABNORMAL LOW (ref 6.5–8.1)

## 2017-11-27 MED ORDER — FUROSEMIDE 10 MG/ML IJ SOLN
60.0000 mg | Freq: Two times a day (BID) | INTRAMUSCULAR | Status: DC
  Administered 2017-11-27: 60 mg via INTRAVENOUS
  Filled 2017-11-27: qty 6

## 2017-11-27 MED ORDER — FUROSEMIDE 10 MG/ML IJ SOLN
80.0000 mg | Freq: Two times a day (BID) | INTRAMUSCULAR | Status: DC
  Administered 2017-11-27: 80 mg via INTRAVENOUS
  Filled 2017-11-27: qty 8

## 2017-11-27 MED ORDER — FUROSEMIDE 10 MG/ML IJ SOLN: 60.0000 mg | Freq: Two times a day (BID) | INTRAMUSCULAR | Status: DC

## 2017-11-27 NOTE — Plan of Care (Signed)
  Education: Ability to verbalize understanding of medication therapies will improve 11/27/2017 0430 - Completed/Met by Garnett-Mellinger, Sophronia Simas, RN

## 2017-11-27 NOTE — Progress Notes (Signed)
RN attempted to wean pt to room air. Pt's SpO2= 85% on room air. RN placed pt back on 2L of O2 nasal cannula; pt SpO2= 96%. Pt states it is "kind of hard" to breathe without the oxygen.

## 2017-11-27 NOTE — Progress Notes (Signed)
Physical Therapy Treatment Patient Details Name: [name] MRN: [id number] DOB: 08-Jun-1932 Today's Date: 11/27/2017    History of Present Illness Pt is an 83 yo female with PMH of CAD, CKD 3, HLD, HTN, OSA, Chronic Afib, HFmrEF, and Hypothyroidism.  She presented  with 1 week of increasing shortness of breath, orthopnea. Pt admitted with dx of CHF.     PT Comments    Pt making steady progress. Pt hesitant about going home tomorrow but when I discussed ST-SNF pt stated she wanted to try home. Pt reports she will have family support at home and is agreeable to HHPT.   Follow Up Recommendations  Home health PT;Supervision/Assistance - 24 hour     Equipment Recommendations  None recommended by PT    Recommendations for Other Services       Precautions / Restrictions Precautions Precautions: Fall Restrictions Weight Bearing Restrictions: No    Mobility  Bed Mobility Overal bed mobility: Needs Assistance Bed Mobility: Supine to Sit;Sit to Supine     Supine to sit: Supervision;HOB elevated Sit to supine: Supervision;HOB elevated   General bed mobility comments: Incr time. Pt sleeps in recliner at home  Transfers Overall transfer level: Needs assistance Equipment used: 4-wheeled walker Transfers: Sit to/from Stand Sit to Stand: Min guard         General transfer comment: Assist for balance and safety  Ambulation/Gait Ambulation/Gait assistance: Min guard Ambulation Distance (Feet): 120 Feet Assistive device: 4-wheeled walker Gait Pattern/deviations: Step-through pattern;Decreased stride length;Trunk flexed Gait velocity: decr Gait velocity interpretation: Below normal speed for age/gender General Gait Details: Assist for safety. Pt amb on 2L of O2 with dyspnea 2/4 and SpO2 92%. Pt took two standing rest breaks. Verbal cues to stand more erect   Stairs            Wheelchair Mobility    Modified Rankin (Stroke Patients Only)       Balance Overall  balance assessment: Needs assistance Sitting-balance support: No upper extremity supported;Feet supported Sitting balance-Leahy Scale: Good     Standing balance support: Single extremity supported Standing balance-Leahy Scale: Poor Standing balance comment: rollator and supervision for static standing                            Cognition Arousal/Alertness: Awake/alert Behavior During Therapy: WFL for tasks assessed/performed Overall Cognitive Status: Within Functional Limits for tasks assessed                                        Exercises      General Comments        Pertinent Vitals/Pain Pain Assessment: No/denies pain    Home Living                      Prior Function            PT Goals (current goals can now be found in the care plan section) Acute Rehab PT Goals Patient Stated Goal: feel better Progress towards PT goals: Progressing toward goals    Frequency    Min 3X/week      PT Plan Current plan remains appropriate    Co-evaluation              AM-PAC PT "6 Clicks" Daily Activity  Outcome Measure  Difficulty turning over in bed (including adjusting bedclothes,  sheets and blankets)?: A Little Difficulty moving from lying on back to sitting on the side of the bed? : A Little Difficulty sitting down on and standing up from a chair with arms (e.g., wheelchair, bedside commode, etc,.)?: Unable Help needed moving to and from a bed to chair (including a wheelchair)?: A Little Help needed walking in hospital room?: A Little Help needed climbing 3-5 steps with a railing? : A Lot 6 Click Score: 15    End of Session Equipment Utilized During Treatment: Gait belt;Oxygen Activity Tolerance: Patient limited by fatigue Patient left: in bed;with call bell/phone within reach;with bed alarm set Nurse Communication: Mobility status PT Visit Diagnosis: Other abnormalities of gait and mobility (R26.89);Muscle weakness  (generalized) (M62.81)     Time: 5400-8676 PT Time Calculation (min) (ACUTE ONLY): 27 min  Charges:  $Gait Training: 23-37 mins                    G Codes:       [facility] PT [place] 11/27/2017, 3:59 PM

## 2017-11-27 NOTE — Consult Note (Signed)
   [facility] Inpatient Consult   11/27/2017  [name] 1932/06/14 [id number]   Patient was assessed for [facility] for community services. Patient was previously been reached by the [facility] Telephonic Nurse.  Met with patient at bedside regarding being restarted with [facility] services.  Patient is very hard of hearing. She states she hears out of her left ear best.  She states she is working well with her cardiologist now and did not feel she needed home visits.  Her primary care provider is Dr. Rachell Cipro.  She states she lives alone but has good help from her son, daughter in law and granddaughter.  She states, "They provide my food, drive me to all of my appointments and get my medicines. Of note, [facility] Care Management services does not replace or interfere with any services that are arranged by inpatient case management or social work.  Provided the patient with a brochure with the 24 hour nurse advise line, and contact information. For additional questions or referrals please contact:  Natividad Brood, RN BSN [facility] Liaison  [phone] business mobile phone Toll free office [phone]

## 2017-11-27 NOTE — Progress Notes (Signed)
RN attempted to ambulate pt. Pt states for RN to return later after she eats lunch. RN will return to ambulate pt.

## 2017-11-27 NOTE — Progress Notes (Signed)
Medicine attending: I examined this patient today together with resident physician Dr. Katherine Roan and I concur with his evaluation and management plan which we discussed together. She is slowly improving.  Diuretic dose increased sequentially over the last 48 hours.  Monitoring status on increased doses of Synthroid for poorly controlled hypothyroidism. We appreciate ongoing assistance from cardiology. Hopefully she can be discharged within the next 24-48 hours.

## 2017-11-27 NOTE — Progress Notes (Signed)
Subjective:  She says that she feels somewhat better today.  Feels as if her swelling is going down and she is less short of breath.  Weight is currently down 5 pounds and diuresis appears to be picking up.  Atrial fibrillation rate appears better controlled and currently is 70 on telemetry at rest.  No chest pain.  Note made from teaching service for consideration of AV ablation and pacemaker.    Objective:  Vital Signs in the last 24 hours: BP 115/66   Pulse 77   Temp 99 F (37.2 C) (Oral)   Resp 16   Ht 5\' 4"  (1.626 m)   Wt 90.6 kg (199 lb 11.8 oz) Comment: bed  SpO2 94%   BMI 34.28 kg/m   Physical Exam: Elderly obese white female somewhat hard of hearing Lungs:  Reduced breath sounds  Cardiac:  Irregular rhythm, normal S1 and S2, no S3, 2/6 systolic murmur at apex Extremities:  Both legs are wrapped, some edema present of toes still.    Intake/Output from previous day: 12/30 0701 - 12/31 0700 In: 1020 [P.O.:1020] Out: 1400 [Urine:1400] Weight Filed Weights   11/25/17 0442 11/26/17 0638 11/27/17 0711  Weight: 92.7 kg (204 lb 5.9 oz) 91.9 kg (202 lb 9.6 oz) 90.6 kg (199 lb 11.8 oz)    Lab Results: Basic Metabolic Panel: Recent Labs    11/26/17 0414 11/27/17 0543  NA 136 132*  K 4.3 4.7  CL 99* 91*  CO2 30 28  GLUCOSE 118* 107*  BUN 19 23*  CREATININE 1.49* 1.38*    CBC: Recent Labs    11/24/17 1155 11/25/17 0413 11/26/17 0414  WBC 12.1* 9.8 8.6  NEUTROABS 9.7*  --   --   HGB 10.5* 9.2* 9.7*  HCT 34.1* 29.3* 32.2*  MCV 84.0 84.7 87.3  PLT 315 239 263    BNP    Component Value Date/Time   BNP 550.6 (H) 11/24/2017 1155    Cardiac Panel (last 3 results) Recent Labs    11/24/17 1155  TROPONINI <0.03   Telemetry: Atrial fibrillation currently rate controlled  Assessment/Plan:  1.  Acute on chronic combined systolic and diastolic heart failure-appears to be responding to increased diuretics 2.  Profound hypothyroidism 3.  Chronic atrial  fibrillation currently rate controlled currently 4.  Long-term use of anticoagulation 5.  Medical noncompliance 6.  Worsening renal failure which appears to have stabilized and may improve with continued diuresis.  Recommendations:  In terms of consideration for AV ablation.  In the hospital on her medical therapy her rate appears to be fairly reasonably controlled.  Currently her rate is around 70-80.  At her age and with her noncompliance not sure that aggressive intervention with mechanical therapy will be of benefit but will be glad to run this by the electrophysiology colleagues.  Continue diuresis with increased dose of furosemide.  Try to titrate up her thyroid replacement further.     Kerry Hough  MD [facility] Card[facility]  11/27/2017, 8:55 AM

## 2017-11-27 NOTE — Progress Notes (Addendum)
Subjective: [name] says she is doing fine today, feels her arms and legs are less swollen and that her urination has increased and she is slightly less short of breath.    Objective:  Vital signs in last 24 hours: Vitals:   11/27/17 0711 11/27/17 0735 11/27/17 0846 11/27/17 1113  BP: 118/72 118/63 115/66 121/60  Pulse: 64  77 66  Resp:    18  Temp: 99 F (37.2 C)   98 F (36.7 C)  TempSrc: Oral   Oral  SpO2: 94%   95%  Weight: 199 lb 11.8 oz (90.6 kg)     Height:       Physical Exam  Constitutional: She is oriented to person, place, and time. She appears well-developed and well-nourished. No distress.  Neck: JVD difficult to assess  Cardiovascular: An irregularly irregular rhythm present. Exam reveals 2/6 systolic murmur. Exam reveals no gallop and no friction rub.  No murmur heard. Pulmonary/Chest: Effort normal. No respiratory distress. She has no wheezes. She exhibits no tenderness.  Abdominal: Soft. Bowel sounds are normal. She exhibits distension. She exhibits no mass. There is no tenderness. There is no rebound and no guarding.  Musculoskeletal:       Right lower leg: She exhibits edema.       Left lower leg: She exhibits edema.  Neurological: She is alert and oriented to person, place, and time.  Skin: Rash (LE stasis dermatitis bilaterally) noted. She is not diaphoretic.  Psychiatric: She has a normal mood and affect. Her behavior is normal.     Assessment/Plan:  Principal Problem:   Acute exacerbation of CHF (congestive heart failure) (HCC) Active Problems:   Chronic atrial fibrillation with RVR (HCC)   Acute on chronic systolic heart failure (HCC)   Hypothyroidism   Myxedema   Pleural effusion, right  Acute on chronic HFmrEF: ischemic cardiomyopathy, LAD stented in 2004, last ECHO in 06/18/17 showed moderate LVH, EF 45-50%, moderate to severe MR, PA pressure mildly to moderately increased.  Presented volume overloaded was on 20mg  PO lasix MWF noticed  decreased urine output over the last month.     -urine output better yesterday weight down about 5 pounds, improvement in edema, Cr stable -asked nurse to try and wean pt to room air today, pt desaturated to 85%, increased back to 96% with 2LNC -Lasix 80mg  BID today -strict I's/O's, daily weights -repeat ECHO shows EF 40-45%, no wall motion abnormalities, increased TR and RA dilation from prior ECHO -wound care for stasis ulcers/dermatitis -UNNA boots    CKD III: pts creatinine better than baseline at 1.15 on admission, baseline appears around 1.6   -Cr stable  -follow BMP   Afib: chronic, likely permanent,  on eliquis, amiodarone 200mg  po daily, diltiazem 180mg , atenolol 150mg    -restart home meds above -eliquis switched to 2.5mg  BID due to age and renal function -will need better control of hypothyroidism, which is complicated by amiodarone toxicity, also likely has OSA needs formal sleep study on discharge and would benefit from CPAP -Even with CPAP and euthyroid state, I am unsure she will stay out of afib for long with cardioversion given significant valvular disease and dilated heart.   -Cardiology following for consideration of ablation/CRT vs continued medical management.   Hypothyroidism: was subtherapeutic on synthroid 50 in July, dosage increased to 125 but pt never followed up for recheck   -repeat TSH still very elevated -started on synthroid 144mcg daily will titrate up    Dispo: Anticipated discharge  in approximately 1 day.   Mikayla Roan, MD 11/27/2017, 11:36 AM Mikayla Muff MD PGY-1 Internal Medicine Pager # [phone]

## 2017-11-28 LAB — BASIC METABOLIC PANEL
ANION GAP: 10 (ref 5–15)
BUN: 23 mg/dL — AB (ref 6–20)
CO2: 37 mmol/L — AB (ref 22–32)
Calcium: 8.1 mg/dL — ABNORMAL LOW (ref 8.9–10.3)
Chloride: 88 mmol/L — ABNORMAL LOW (ref 101–111)
Creatinine, Ser: 1.5 mg/dL — ABNORMAL HIGH (ref 0.44–1.00)
GFR calc Af Amer: 36 mL/min — ABNORMAL LOW (ref >60)
GFR calc non Af Amer: 31 mL/min — ABNORMAL LOW (ref >60)
GLUCOSE: 109 mg/dL — AB (ref 65–99)
POTASSIUM: 3.6 mmol/L (ref 3.5–5.1)
Sodium: 135 mmol/L (ref 135–145)

## 2017-11-28 MED ORDER — POTASSIUM CHLORIDE CRYS ER 20 MEQ PO TBCR
40.0000 meq | EXTENDED_RELEASE_TABLET | Freq: Two times a day (BID) | ORAL | Status: AC
  Administered 2017-11-28 (×2): 40 meq via ORAL
  Filled 2017-11-28 (×2): qty 2

## 2017-11-28 MED ORDER — POTASSIUM CHLORIDE CRYS ER 20 MEQ PO TBCR: 30.0000 meq | EXTENDED_RELEASE_TABLET | Freq: Two times a day (BID) | ORAL | Status: DC

## 2017-11-28 MED ORDER — FUROSEMIDE 10 MG/ML IJ SOLN
80.0000 mg | Freq: Two times a day (BID) | INTRAMUSCULAR | Status: DC
  Administered 2017-11-28 – 2017-12-02 (×9): 80 mg via INTRAVENOUS
  Filled 2017-11-28 (×9): qty 8

## 2017-11-28 MED ORDER — LEVOTHYROXINE SODIUM 100 MCG PO TABS
200.0000 ug | ORAL_TABLET | Freq: Every day | ORAL | Status: DC
  Administered 2017-11-29: 200 ug via ORAL
  Filled 2017-11-28: qty 2

## 2017-11-28 NOTE — Plan of Care (Signed)
  Cardiac: Ability to achieve and maintain adequate cardiopulmonary perfusion will improve 11/28/2017 2357 - Progressing by Tristan Schroeder, RN

## 2017-11-28 NOTE — Plan of Care (Signed)
  Activity: Capacity to carry out activities will improve 11/28/2017 0152 - Progressing by Tristan Schroeder, RN

## 2017-11-28 NOTE — Progress Notes (Signed)
Subjective:  Says that breathing is better today than yesterday.  No complaints of chest pain.  Still diuresing some.  Weight is really not down that much.  No chest pain.   Objective:  Vital Signs in the last 24 hours: BP (!) 115/55 (BP Location: Right Arm)   Pulse 77   Temp 97.9 F (36.6 C) (Oral)   Resp 18   Ht 5\' 4"  (1.626 m)   Wt 90.7 kg (199 lb 15.3 oz) Comment: bed scale  SpO2 100%   BMI 34.32 kg/m   Physical Exam: Elderly obese white female somewhat hard of hearing Lungs:  Reduced breath sounds  Cardiac:  Irregular rhythm, normal S1 and S2, no S3, 2/6 systolic murmur at apex Extremities:  Both legs are wrapped, some edema present of toes still.    Intake/Output from previous day: 12/31 0701 - 01/01 0700 In: 840 [P.O.:840] Out: 3200 [Urine:3200] Weight Filed Weights   11/26/17 0350 11/27/17 0711 11/28/17 0735  Weight: 91.9 kg (202 lb 9.6 oz) 90.6 kg (199 lb 11.8 oz) 90.7 kg (199 lb 15.3 oz)    Lab Results: Basic Metabolic Panel: Recent Labs    11/27/17 0543 11/28/17 0359  NA 132* 135  K 4.7 3.6  CL 91* 88*  CO2 28 37*  GLUCOSE 107* 109*  BUN 23* 23*  CREATININE 1.38* 1.50*    CBC: Recent Labs    11/26/17 0414  WBC 8.6  HGB 9.7*  HCT 32.2*  MCV 87.3  PLT 263    BNP    Component Value Date/Time   BNP 550.6 (H) 11/24/2017 1155   Telemetry: Atrial fibrillation currently rate controlled, and no increased ventricular response noted in the past day.  Assessment/Plan:  1.  Acute on chronic combined systolic and diastolic heart failure-appears to be responding to increased diuretics however weight has not really changed that much 2.  Profound hypothyroidism 3.  Chronic atrial fibrillation currently rate controlled currently 4.  Long-term use of anticoagulation 5.  Medical noncompliance 6.  Worsening renal failure which appears to have stabilized and may improve with continued diuresis.  We will need to watch it carefully.  Recommendations:  1.   I spoke with electrophysiology about the question of AV nodal ablation/biventricular pacing.  In light of age, noncompliance and the fact that her ventricular rate is well controlled would defer this at the present time 2.  Long history this past year of noncompliance with failure to follow-up on a timely basis..  I would really be very careful about an early discharge in her as she lives alone.  She will need to either have skilled nursing or extensive home health and would go ahead and initiate case management to look at this and I think she would benefit from additional hospital days for intravenous diuresis and attention to her renal function. 3.  Increase thyroid medicine at this time.    Kerry Hough  MD [facility] Cardiology  11/28/2017, 10:36 AM

## 2017-11-28 NOTE — Progress Notes (Addendum)
Subjective: [name] says she is doing fine today, asked her about how PT went and she said okay but she was short of breath and took frequent breaks.  She denies any chest pain, nausea/vomiting.    Objective:  Vital signs in last 24 hours: Vitals:   11/27/17 1113 11/27/17 2115 11/28/17 0735 11/28/17 1015  BP: 121/60 (!) 115/56 123/61 (!) 115/55  Pulse: 66 77 71 77  Resp: 18     Temp: 98 F (36.7 C) 98 F (36.7 C) 97.8 F (36.6 C) 97.9 F (36.6 C)  TempSrc: Oral Oral Oral Oral  SpO2: 95% 99% 97% 100%  Weight:   199 lb 15.3 oz (90.7 kg)   Height:       Physical Exam  Constitutional: She is oriented to person, place, and time. She appears well-developed and well-nourished. No distress.  Neck: JVD difficult to assess  Cardiovascular: An irregularly irregular rhythm present. Exam reveals 2/6 systolic murmur. Exam reveals no gallop and no friction rub.  No murmur heard. Pulmonary/Chest: Effort normal. Bibasilar rales worse on left No respiratory distress. She has no wheezes. She exhibits no tenderness.  Abdominal: Soft. Bowel sounds are normal. She exhibits distension. She exhibits no mass. There is no tenderness. There is no rebound and no guarding.  Musculoskeletal:       Right lower leg: She exhibits edema.       Left lower leg: She exhibits edema.  Neurological: She is alert and oriented to person, place, and time.  Skin: Rash (LE stasis dermatitis bilaterally) noted. She is not diaphoretic.  Psychiatric: She has a normal mood and affect. Her behavior is normal.     Assessment/Plan:  Principal Problem:   Acute exacerbation of CHF (congestive heart failure) (HCC) Active Problems:   Chronic atrial fibrillation with RVR (HCC)   Acute on chronic systolic heart failure (HCC)   Hypothyroidism   Myxedema   Pleural effusion, right  Acute on chronic HFmrEF: ischemic cardiomyopathy, LAD stented in 2004, last ECHO in 06/18/17 showed moderate LVH, EF 45-50%, moderate to severe  MR, PA pressure mildly to moderately increased.  Presented volume overloaded was on 20mg  PO lasix MWF noticed decreased urine output over the last month.     -urine output better yesterday, weight stable may not be accurate, some continued improvement in edema, Cr stable -could not wean pt to room air yesterday, pt desaturated to 85%, increased back to 96% with 2LNC -Lasix 80mg  BID today -strict I's/O's, daily weights -repeat ECHO shows EF 40-45%, no wall motion abnormalities, increased TR and RA dilation from prior ECHO -wound care for stasis ulcers/dermatitis -UNNA boots    CKD III: pts creatinine better than baseline at 1.15 on admission, baseline appears around 1.6   -Cr stable slowly uptrending, bicarb elevated today -follow BMP   Afib: chronic, likely permanent,  on eliquis, amiodarone 200mg  po daily, diltiazem 180mg , atenolol 150mg    -restart home meds above -eliquis switched to 2.5mg  BID due to age and renal function -will need better control of hypothyroidism, which is complicated by amiodarone toxicity, also likely has OSA needs formal sleep study on discharge and would benefit from CPAP -Even with CPAP and euthyroid state, I am unsure she will stay out of afib for long with cardioversion given significant valvular disease and dilated heart.   -Cardiology following feel medical management is best for now   Hypothyroidism: was subtherapeutic on synthroid 50 in July, dosage increased to 125 but pt never followed up for recheck   -  repeat TSH still very elevated -started on synthroid 119mcg daily titrated up to 222mcg, difficult to know what the correct dose will be, repeating TSH this early with recent change in therapy would be of little benefit in seeing where we stand.    Dispo: Anticipated discharge in approximately 2-3 days.   Katherine Roan, MD 11/28/2017, 11:39 AM Vickki Muff MD PGY-1 Internal Medicine Pager # [phone]

## 2017-11-28 NOTE — Progress Notes (Signed)
Medicine attending: Clinical status and database reviewed and I concur with the evaluation and management plan as recorded by resident physician Dr. Katherine Roan which we discussed together. She is diuresing better at increased dose of furosemide currently at 80 mg twice daily.  BUN and creatinine overall stable with minor changes. Good rate control of her chronic atrial fibrillation at this time. Current Synthroid dose up to 200 mcg.  I would be extremely cautious about going any higher without endocrinology consultation in this elderly woman.  The drug has a long half-life and it is too soon to see the maximum effect of recent dose changes.

## 2017-11-29 LAB — BASIC METABOLIC PANEL
ANION GAP: 6 (ref 5–15)
BUN: 27 mg/dL — ABNORMAL HIGH (ref 6–20)
CALCIUM: 8.4 mg/dL — AB (ref 8.9–10.3)
CO2: 39 mmol/L — ABNORMAL HIGH (ref 22–32)
Chloride: 89 mmol/L — ABNORMAL LOW (ref 101–111)
Creatinine, Ser: 1.4 mg/dL — ABNORMAL HIGH (ref 0.44–1.00)
GFR, EST AFRICAN AMERICAN: 39 mL/min — AB (ref >60)
GFR, EST NON AFRICAN AMERICAN: 34 mL/min — AB (ref >60)
Glucose, Bld: 118 mg/dL — ABNORMAL HIGH (ref 65–99)
Potassium: 4.9 mmol/L (ref 3.5–5.1)
Sodium: 134 mmol/L — ABNORMAL LOW (ref 135–145)

## 2017-11-29 NOTE — Care Management Important Message (Signed)
Important Message  Patient Details  Name: [name] MRN: [id number] Date of Birth: July 11, 1932   Medicare Important Message Given:  Yes    Orbie Pyo 11/29/2017, 2:22 PM

## 2017-11-29 NOTE — Progress Notes (Signed)
Physical Therapy Treatment Patient Details Name: [name] MRN: [id number] DOB: 10-06-1932 Today's Date: 11/29/2017    History of Present Illness Pt is an 84 yo female with PMH of CAD, CKD 3, HLD, HTN, OSA, Chronic Afib, HFmrEF, and Hypothyroidism.  She presented  with 1 week of increasing shortness of breath, orthopnea. Pt admitted with dx of CHF.     PT Comments    Pt's progress very slow. Continues to show decreased functional activity tolerance. Recommend pt go to [facility] for further rehab prior to return home. Not sure family available to provide 24 hour assist/supervision. Pt now agreeable to this plan.    Pt on 1L of O2 at rest with SpO2 95%. Removed O2 and pt with SpO2 of 85% after 4-5 minutes on RA at rest. Replaced O2 at 1L with SpO2 back to 89% on EOB. Amb on 2L O2 with SpO2 94%.    Follow Up Recommendations  SNF     Equipment Recommendations  None recommended by PT    Recommendations for Other Services       Precautions / Restrictions Precautions Precautions: Fall Restrictions Weight Bearing Restrictions: No    Mobility  Bed Mobility Overal bed mobility: Needs Assistance Bed Mobility: Supine to Sit     Supine to sit: HOB elevated;Min assist     General bed mobility comments: Assist to elevate trunk into sitting  Transfers Overall transfer level: Needs assistance Equipment used: 4-wheeled walker Transfers: Sit to/from Stand Sit to Stand: Min guard         General transfer comment: Assist for balance and safety  Ambulation/Gait Ambulation/Gait assistance: Min guard Ambulation Distance (Feet): 45 Feet(45' x 2) Assistive device: 4-wheeled walker Gait Pattern/deviations: Step-through pattern;Decreased stride length;Trunk flexed Gait velocity: decr Gait velocity interpretation: Below normal speed for age/gender General Gait Details: Assist for safety. Pt amb on 2L of O2 with dyspnea 2/4 and SpO2 94%. Pt took two standing rest breaks. Verbal cues to  stand more erect   Stairs            Wheelchair Mobility    Modified Rankin (Stroke Patients Only)       Balance Overall balance assessment: Needs assistance Sitting-balance support: No upper extremity supported;Feet supported Sitting balance-Leahy Scale: Good     Standing balance support: Single extremity supported Standing balance-Leahy Scale: Poor Standing balance comment: rollator and supervision for static standing                            Cognition Arousal/Alertness: Awake/alert Behavior During Therapy: WFL for tasks assessed/performed Overall Cognitive Status: Within Functional Limits for tasks assessed                                        Exercises      General Comments        Pertinent Vitals/Pain      Home Living                      Prior Function            PT Goals (current goals can now be found in the care plan section) Progress towards PT goals: Not progressing toward goals - comment    Frequency    Min 3X/week      PT Plan Discharge plan needs to be updated  Co-evaluation              AM-PAC PT "6 Clicks" Daily Activity  Outcome Measure  Difficulty turning over in bed (including adjusting bedclothes, sheets and blankets)?: A Little Difficulty moving from lying on back to sitting on the side of the bed? : Unable Difficulty sitting down on and standing up from a chair with arms (e.g., wheelchair, bedside commode, etc,.)?: Unable Help needed moving to and from a bed to chair (including a wheelchair)?: A Little Help needed walking in hospital room?: A Little Help needed climbing 3-5 steps with a railing? : A Lot 6 Click Score: 13    End of Session Equipment Utilized During Treatment: Gait belt;Oxygen Activity Tolerance: Patient limited by fatigue Patient left: with call bell/phone within reach;in chair;with chair alarm set Nurse Communication: Mobility status PT Visit  Diagnosis: Other abnormalities of gait and mobility (R26.89);Muscle weakness (generalized) (M62.81)     Time: 6438-3818 PT Time Calculation (min) (ACUTE ONLY): 29 min  Charges:  $Gait Training: 23-37 mins                    G Codes:       [facility] PT [place] 11/29/2017, 2:19 PM

## 2017-11-29 NOTE — Progress Notes (Signed)
Subjective:  Still desats some.  Diuresing significantly but no change in weight.  No chest pain   Objective:  Vital Signs in the last 24 hours: BP (!) 113/49 (BP Location: Right Arm)   Pulse 73   Temp 98.2 F (36.8 C) (Oral)   Resp 18   Ht 5\' 4"  (1.626 m)   Wt 91.3 kg (201 lb 4.8 oz)   SpO2 94%   BMI 34.55 kg/m   Physical Exam: Elderly obese white female somewhat hard of hearing Lungs:  Reduced breath sounds  Cardiac:  Irregular rhythm, normal S1 and S2, no S3, 2/6 systolic murmur at apex Extremities:  Both legs are wrapped, some edema present of toes still.  Right arm mildly swollen.  Intake/Output from previous day: 01/01 0701 - 01/02 0700 In: 1078 [P.O.:1078] Out: 2870 [Urine:2870] Weight Filed Weights   11/27/17 0711 11/28/17 0735 11/29/17 0447  Weight: 90.6 kg (199 lb 11.8 oz) 90.7 kg (199 lb 15.3 oz) 91.3 kg (201 lb 4.8 oz)    Lab Results: Basic Metabolic Panel: Recent Labs    11/28/17 0359 11/29/17 0621  NA 135 134*  K 3.6 4.9  CL 88* 89*  CO2 37* 39*  GLUCOSE 109* 118*  BUN 23* 27*  CREATININE 1.50* 1.40*    BNP    Component Value Date/Time   BNP 550.6 (H) 11/24/2017 1155    Cardiac Panel (last 3 results) No results for input(s): CKTOTAL, CKMB, TROPONINI, RELINDX in the last 72 hours. Telemetry: Atrial fibrillation currently rate controlled  Assessment/Plan:  1.  Acute on chronic combined systolic and diastolic heart failure-appears to be responding to increased diuretics 2.  Profound hypothyroidism 3.  Chronic atrial fibrillation currently rate controlled currently 4.  Long-term use of anticoagulation 5.  Medical noncompliance 6.  Worsening renal failure which appears to have stabilized and may improve with continued diuresis.  Recommendations:  In terms of consideration for AV ablation best to treat medically.  I would continue diuersis.      Kerry Hough  MD [facility]  11/29/2017, 4:10 PM

## 2017-11-29 NOTE — Progress Notes (Addendum)
   Subjective: [name] was seen laying in her bed this morning. [name] states that the swelling in bilateral arms seems to have decreased. [name] denied shortness of breath.   Objective:  Vital signs in last 24 hours: Vitals:   11/28/17 2034 11/29/17 0447 11/29/17 0852 11/29/17 1121  BP: (!) 116/48 (!) 119/56 (!) 126/55 (!) 113/49  Pulse: 77 69 82 73  Resp: 18 18    Temp: (!) 97.5 F (36.4 C) 97.9 F (36.6 C) 98.2 F (36.8 C)   TempSrc: Oral Oral Oral   SpO2: 97% 100% 94% 94%  Weight:  201 lb 4.8 oz (91.3 kg)    Height:       Physical Exam  Constitutional: [name] appears well-developed and well-nourished. No distress.  HENT:  Head: Normocephalic and atraumatic.  Eyes: Conjunctivae are normal.  Neck: JVD present.  Cardiovascular: Normal rate, regular rhythm, normal heart sounds and intact distal pulses.  Respiratory: Effort normal. No respiratory distress. [name] has no wheezes.  GI: Soft. Bowel sounds are normal. [name] exhibits no distension. There is no tenderness.  Musculoskeletal: [name] exhibits edema.  Neurological: [name] is alert.  Skin: [name] is not diaphoretic.  Psychiatric: [name] has a normal mood and affect. Her behavior is normal. Judgment and thought content normal.    Assessment/Plan:  Acute on chronic HFrEF:  The patient has a ef of 40-45%, no regional wall motion abnormalities, and mildly calcified mitral annulus leaflets and moderate to severe regurgitation. The patient is likely to have an exacerbation of heart failure due to her thyroid abnormalities.   The patient put out -1.7L over the past 24 hrs. The patient is currently on 1.5L via nasal cannula.   -Continue lasix 80mg  BID today -Heart healthy with fluid restriction to 1274mL -wound care for stasis ulcers/dermatitis -UNNA boots   CKD III The patient's cr=1.4 this morning 11/29/17  -follow daily BMP  Chronic Atrial Fibrillation  The patient was in atrial fibrillation on telemetry. Cardiology recommended medical  management rather than AV ablation.   - Continue eliquis 2.5mg  bid, amiodarone 200mg  po daily, diltiazem 180mg , atenolol 150mg  -eliquis switched to 2.5mg  BID due to age and renal function -on discharge sleep study to check for OSA  Hypothyroidism  -Continue levothyroxine 26mcg. -Will consider outpatient endocrinology follow up for dosage adjustment with concomitant amiodarone use.    Dispo: Anticipated discharge in approximately 1-2 day(s).   Lars Mage, MD Internal Medicine PGY1 Pager:[phone] 11/29/2017, 4:14 PM

## 2017-11-29 NOTE — Progress Notes (Signed)
MD paged concerning cardiac monitor order.

## 2017-11-29 NOTE — Progress Notes (Signed)
Patient stood up for a standing weight and walked to bathroom. Improvement in mobilty since admission. Still comfortable on 2L O2 via nasal cannula.

## 2017-11-29 NOTE — Progress Notes (Signed)
SATURATION QUALIFICATIONS: (This note is used to comply with regulatory documentation for home oxygen)  Patient Saturations on Room Air at Rest = 85%  Patient Saturations on Room Air while Ambulating = N/A%  Patient Saturations on 2 Liters of oxygen while Ambulating = 94%  Please briefly explain why patient needs home oxygen: Pt unable to maintain adequate oxygenation even in supine or sitting EOB.  [employer] PT [phone]

## 2017-11-29 NOTE — Progress Notes (Signed)
Patient would benefit from inter-dry underneath breast. The area is constantly moist and has a smell, staff using powder and cleaning.

## 2017-11-29 NOTE — Progress Notes (Signed)
  Date: 11/29/2017  Patient name: [name]  Medical record number: [id number]  Date of birth: [birth year]   I have seen and evaluated this patient and I have discussed the plan of care with the house staff. Please see their note for complete details. I concur with their findings with the following additions/corrections:   Continues to improve with ongoing diuresis, also with good rate control. We have increased her dose of levothyroxine to 200 mcg daily as she has ongoing profound hypothyroidism with a TSH of 108. Her hypothyroidism is likely due to underlying autoimmune hypothyroidism exacerbated by amiodarone therapy, which necessitates higher dosing of levothyroxine. Appropriate treatment of her hypothyroidism will hopefully improve her cardiac function and overall energy level.   [name], MD 11/29/2017, 5:25 PM

## 2017-11-30 DIAGNOSIS — L97519 Non-pressure chronic ulcer of other part of right foot with unspecified severity: Secondary | ICD-10-CM

## 2017-11-30 LAB — BASIC METABOLIC PANEL
Anion gap: 9 (ref 5–15)
BUN: 26 mg/dL — AB (ref 6–20)
CO2: 34 mmol/L — AB (ref 22–32)
Calcium: 8.5 mg/dL — ABNORMAL LOW (ref 8.9–10.3)
Chloride: 94 mmol/L — ABNORMAL LOW (ref 101–111)
Creatinine, Ser: 1.26 mg/dL — ABNORMAL HIGH (ref 0.44–1.00)
GFR calc Af Amer: 44 mL/min — ABNORMAL LOW (ref >60)
GFR, EST NON AFRICAN AMERICAN: 38 mL/min — AB (ref >60)
Glucose, Bld: 108 mg/dL — ABNORMAL HIGH (ref 65–99)
POTASSIUM: 4.6 mmol/L (ref 3.5–5.1)
Sodium: 137 mmol/L (ref 135–145)

## 2017-11-30 MED ORDER — LEVOTHYROXINE SODIUM 100 MCG PO TABS
200.0000 ug | ORAL_TABLET | Freq: Every day | ORAL | Status: DC
  Administered 2017-11-30 – 2017-12-08 (×9): 200 ug via ORAL
  Filled 2017-11-30 (×9): qty 2

## 2017-11-30 NOTE — Clinical Social Work Placement (Signed)
   CLINICAL SOCIAL WORK PLACEMENT  NOTE  Date:  11/30/2017  Patient Details  Name: SAUL DORSI MRN: [id number] Date of Birth: [birth year]  Clinical Social Work is seeking post-discharge placement for this patient at the [place] level of care (*CSW will initial, date and re-position this form in  chart as items are completed):  Yes   Patient/family provided with [place] Work Department's list of facilities offering this level of care within the geographic area requested by the patient (or if unable, by the patient's family).  Yes   Patient/family informed of their freedom to choose among providers that offer the needed level of care, that participate in Medicare, Medicaid or managed care program needed by the patient, have an available bed and are willing to accept the patient.  Yes   Patient/family informed of [place]'s ownership interest in [facility] and [facility], as well as of the fact that they are under no obligation to receive care at these facilities.  PASRR submitted to EDS on 11/30/17     PASRR number received on       Existing PASRR number confirmed on       FL2 transmitted to all facilities in geographic area requested by pt/family on 11/30/17     FL2 transmitted to all facilities within larger geographic area on       Patient informed that his/her managed care company has contracts with or will negotiate with certain facilities, including the following:            Patient/family informed of bed offers received.  Patient chooses bed at       Physician recommends and patient chooses bed at      Patient to be transferred to   on  .  Patient to be transferred to facility by       Patient family notified on   of transfer.  Name of family member notified:        PHYSICIAN Please sign FL2     Additional Comment:    _______________________________________________ Candie Chroman, LCSW 11/30/2017, 1:48 PM

## 2017-11-30 NOTE — Progress Notes (Addendum)
   Subjective: [name] was seen laying in her bed this morning doing well. She stated that she continues to have some shortness of breath. She feels that her edema has improved. She mentioned that her right foot was painful.   Objective:  Vital signs in last 24 hours: Vitals:   11/29/17 0852 11/29/17 1121 11/29/17 2305 11/30/17 0428  BP: (!) 126/55 (!) 113/49 (!) 124/56 140/78  Pulse: 82 73 82 69  Resp:   18 18  Temp: 98.2 F (36.8 C)  98.6 F (37 C) 97.6 F (36.4 C)  TempSrc: Oral  Oral Oral  SpO2: 94% 94% 96% 100%  Weight:    199 lb 8.3 oz (90.5 kg)  Height:        Physical Exam  Constitutional: She appears well-developed and well-nourished. No distress.  HENT:  Head: Normocephalic and atraumatic.  Eyes: Conjunctivae are normal.  Neck: JVD present.  Cardiovascular: Normal rate, regular rhythm and normal heart sounds.  Respiratory: Effort normal. No respiratory distress. She has no wheezes. She has rales.  GI: Soft. Bowel sounds are normal. She exhibits no distension. There is no tenderness.  Musculoskeletal: She exhibits edema (bilateral upper and lower extremities).  Neurological: She is alert.  Skin: She is not diaphoretic. There is erythema (dorsal aspect of right foot).  Psychiatric: She has a normal mood and affect. Her behavior is normal. Judgment and thought content normal.      Assessment/Plan: Acute on chronic HFrEF:  The patient is likely to have an exacerbation of heart failure due to her thyroid abnormalities. The patient still appears to be hypervolemic with jvd, edema, and rales on examination. The patient put out -928mL over the past 24 hrs. The patient is currently on 2L via . The patient's cr=1.26 and hco3=34 (11/30/17)  -Continue lasix 80mg  BID today -Heart healthy with fluid restriction to 1252mL -wound care for stasis ulcers/dermatitis -continue UNNA boots   CKD III The patient's cr=1.26 this morning 11/30/17  -follow daily BMP  Chronic  Atrial Fibrillation  The patient was in atrial fibrillation on telemetry. Cardiology recommended medical management rather than AV ablation.   - Continue eliquis 2.5mg  bid, amiodarone 200mg  po daily, diltiazem 180mg , atenolol 150mg  -Continue eliquis 2.5mg  BID  -on discharge sleep study to check for OSA -continue cardiac monitoring  Hypothyroidism  -Continue levothyroxine 264mcg. -Will consider outpatient endocrinology follow up for dosage adjustment with concomitant amiodarone use.   Dorsal right foot ulceration The dorsal aspect of the right foot was noted to have continued discharge and erythema. The patient complained of right foot pain today.   -wound care consult -tylenol for pain control  Dispo: Anticipated discharge in approximately 1-2 day(s).   Lars Mage, MD Internal Medicine PGY1 Pager:[phone] 11/30/2017, 6:29 AM

## 2017-11-30 NOTE — NC FL2 (Signed)
[place] MEDICAID FL2 LEVEL OF CARE SCREENING TOOL     IDENTIFICATION  Patient Name: [name] Birthdate: Jan 08, 1932 Sex: female Admission Date (Current Location): 11/24/2017  [facility] and [place] Number:  Herbalist and Address:  [facility], [place] [address] [place], [place] [address]      Provider Number: [id number]  Attending Physician Name and Address:  Annia Belt, MD  Relative Name and Phone Number:       Current Level of Care: Hospital Recommended Level of Care: [place] Prior Approval Number:    Date Approved/Denied:   PASRR Number: Manual review  Discharge Plan: SNF    Current Diagnoses: Patient Active Problem List   Diagnosis Date Noted  . Acute on chronic systolic heart failure ([place])   . Hypothyroidism   . Myxedema   . Pleural effusion, right   . Chronic atrial fibrillation with RVR (HCC)   . Atrial fibrillation with RVR ([place])   . Hyperkalemia   . Current use of long term anticoagulation 06/18/2017  . Sleep apnea 06/18/2017  . Obesity (BMI 30-39.9) 06/18/2017  . LBBB (left bundle branch block) 06/18/2017  . CKD (chronic kidney disease), stage III ([place])   . Acute exacerbation of CHF (congestive heart failure) ([place]) 06/17/2017  . Persistent atrial fibrillation ([place])   . Dyspnea 11/27/2014  . Fatigue 11/27/2014  . AKI (acute kidney injury) ([place]) 11/27/2014  . Hyperlipidemia 02/14/2011  . Anxiety state 02/14/2011  . Depressive disorder 02/14/2011  . Hypertensive heart disease without CHF 02/14/2011    Orientation RESPIRATION BLADDER Height & Weight     Self, Time, Situation, Place  O2(Nasal Canula 3 L.) Incontinent, External catheter Weight: 199 lb 8.3 oz (90.5 kg) Height:  5\' 4"  (162.6 cm)  BEHAVIORAL SYMPTOMS/MOOD NEUROLOGICAL BOWEL NUTRITION STATUS  (None) (None) Continent Diet(Heart healthy. Fluid restriction 1200 mL.)  AMBULATORY STATUS COMMUNICATION OF NEEDS Skin   Limited  Assist Verbally Bruising, Other (Comment)(Blister, Excoriated, MASD.)                       Personal Care Assistance Level of Assistance              Functional Limitations Info  Sight, Hearing, Speech Sight Info: Adequate Hearing Info: Impaired Speech Info: Adequate    SPECIAL CARE FACTORS FREQUENCY  PT (By licensed PT)     PT Frequency: 5 x week              Contractures Contractures Info: Not present    Additional Factors Info  Code Status, Allergies, Psychotropic Code Status Info: Full Allergies Info: Ampicillin, Hydromorphone Hcl, Penicillins Psychotropic Info: Anxiety, Depression: No psychotropic meds         Current Medications (11/30/2017):  This is the current hospital active medication list Current Facility-Administered Medications  Medication Dose Route Frequency Provider Last Rate Last Dose  . acetaminophen (TYLENOL) tablet 650 mg  650 mg Oral Q6H PRN Molt, Bethany, DO   650 mg at 11/29/17 2226   Or  . acetaminophen (TYLENOL) suppository 650 mg  650 mg Rectal Q6H PRN Molt, Bethany, DO      . amiodarone (PACERONE) tablet 200 mg  200 mg Oral Daily Molt, Bethany, DO   200 mg at 11/30/17 1111  . apixaban (ELIQUIS) tablet 2.5 mg  2.5 mg Oral BID Molt, Bethany, DO   2.5 mg at 11/30/17 1111  . atenolol (TENORMIN) tablet 150 mg  150 mg Oral  Daily Molt, Bethany, DO   150 mg at 11/30/17 1110  . atorvastatin (LIPITOR) tablet 60 mg  60 mg Oral q1800 Katherine Roan, MD   60 mg at 11/29/17 1712  . diltiazem (CARDIZEM CD) 24 hr capsule 180 mg  180 mg Oral Daily Molt, Bethany, DO   180 mg at 11/30/17 1110  . furosemide (LASIX) injection 80 mg  80 mg Intravenous BID Katherine Roan, MD   80 mg at 11/30/17 1111  . levothyroxine (SYNTHROID, LEVOTHROID) tablet 200 mcg  200 mcg Oral QAC breakfast Annia Belt, MD   200 mcg at 11/30/17 1025     Discharge Medications: Please see discharge summary for a list of discharge medications.  Relevant Imaging  Results:  Relevant Lab Results:   Additional Information SS#: [id number]  Candie Chroman, LCSW

## 2017-11-30 NOTE — Clinical Social Work Note (Signed)
Clinical Social Work Assessment  Patient Details  Name: [name] MRN: [id number] Date of Birth: 05-Feb-1932  Date of referral:  11/30/17               Reason for consult:  Facility Placement, Discharge Planning                Permission sought to share information with:  Facility Sport and exercise psychologist, Family Supports Permission granted to share information::  Yes, Verbal Permission Granted  Name::     Davena Julian  Agency::  SNF's  Relationship::  Son  Contact Information:  [phone]  Housing/Transportation Living arrangements for the past 2 months:  [place] of Information:  Patient, Medical Team Patient Interpreter Needed:  None Criminal Activity/Legal Involvement Pertinent to Current Situation/Hospitalization:  No - Comment as needed Significant Relationships:  Adult Children Lives with:  Self Do you feel safe going back to the place where you live?  Yes Need for family participation in patient care:  Yes (Comment)  Care giving concerns:  PT recommending SNF once medically stable for discharge.   Social Worker assessment / plan:  CSW met with patient. No supports at bedside. CSW introduced role and explained that PT recommendations would be discussed. Patient is agreeable to SNF placement. She is requesting a private room at [facility]. Admissions coordinator notified. She might have a private room available tomorrow if patient is stable for discharge. Patient gave CSW permission to notify her son. CSW left him a voicemail. PASARR under manual review. Patient cannot go to SNF until PASARR obtained. No further concerns. CSW encouraged patient to contact CSW as needed. CSW will continue to follow patient for support and facilitate discharge to SNF once medically stable.  Employment status:  Retired Forensic scientist:  Medicare PT Recommendations:  [place] / Referral to community resources:  [place]  Patient/Family's Response to care:  Patient agreeable to SNF placement. Patient's son supportive and involved in patient's care. Patient appreciated social work intervention.  Patient/Family's Understanding of and Emotional Response to Diagnosis, Current Treatment, and Prognosis:  Patient has a good understanding of the reason for admission and her need for rehab prior to returning home. Patient appears happy with hospital care.  Emotional Assessment Appearance:  Appears stated age Attitude/Demeanor/Rapport:  Engaged, Gracious Affect (typically observed):  Accepting, Appropriate, Calm, Pleasant Orientation:  Oriented to Self, Oriented to Place, Oriented to  Time, Oriented to Situation Alcohol / Substance use:  Never Used Psych involvement (Current and /or in the community):  No (Comment)  Discharge Needs  Concerns to be addressed:  Care Coordination Readmission within the last 30 days:  No Current discharge risk:  Dependent with Mobility, Lives alone Barriers to Discharge:  Continued Medical Work up, Programmer, applications ([place])   Candie Chroman, LCSW 11/30/2017, 1:45 PM

## 2017-11-30 NOTE — Progress Notes (Signed)
Physical Therapy Treatment Patient Details Name: [name] MRN: [id number] DOB: [birth year] Today's Date: 11/30/2017    History of Present Illness Pt is an 84 yo female with PMH of CAD, CKD 3, HLD, HTN, OSA, Chronic Afib, HFmrEF, and Hypothyroidism.  She presented  with 1 week of increasing shortness of breath, orthopnea. Pt admitted with dx of CHF.     PT Comments    Patient received in bed, pleasant and willing to participate in skilled PT services today. Able to perform all bed mobility, functional transfers, and gait generally with Min guard but fairly unsteady without assistive device and limited by fatigue this session, requiring multiple (5+) standing rest breaks with gait on 3LPM O2. Only able to gait train approximately 42f today with walker. Patient left up in chair with all needs met, chair alarm activated, nursing staff in room at end of session.    Follow Up Recommendations  SNF     Equipment Recommendations  None recommended by PT    Recommendations for Other Services       Precautions / Restrictions Precautions Precautions: Fall Restrictions Weight Bearing Restrictions: No    Mobility  Bed Mobility Overal bed mobility: Needs Assistance Bed Mobility: Supine to Sit     Supine to sit: HOB elevated;Min guard     General bed mobility comments: VC for form and safety, min guard for safety   Transfers Overall transfer level: Needs assistance Equipment used: Rolling walker (2 wheeled);None Transfers: Sit to/from Stand Sit to Stand: Min guard         General transfer comment: initially trialed without device, patient very unsteady; transitioned to RW with increased safety and stability noted   Ambulation/Gait Ambulation/Gait assistance: Min guard Ambulation Distance (Feet): 50 Feet Assistive device: Rolling walker (2 wheeled) Gait Pattern/deviations: Step-through pattern;Decreased stride length;Trunk flexed     General Gait Details: min guard for  safety, 3LPM O2 today, multiple standing rest breaks noted (5+). Limited by fatigue. Unable to safely ambulate without external assistive device.    Stairs            Wheelchair Mobility    Modified Rankin (Stroke Patients Only)       Balance Overall balance assessment: Needs assistance Sitting-balance support: Bilateral upper extremity supported;Feet supported Sitting balance-Leahy Scale: Good     Standing balance support: Bilateral upper extremity supported;During functional activity Standing balance-Leahy Scale: Fair                 High Level Balance Comments: balance poor without assistive device             Cognition Arousal/Alertness: Awake/alert Behavior During Therapy: WFL for tasks assessed/performed Overall Cognitive Status: Within Functional Limits for tasks assessed                                        Exercises      General Comments        Pertinent Vitals/Pain Pain Assessment: No/denies pain    Home Living                      Prior Function            PT Goals (current goals can now be found in the care plan section) Acute Rehab PT Goals Patient Stated Goal: feel better PT Goal Formulation: With patient Time For Goal Achievement: 12/09/17 Potential to  Achieve Goals: Good Progress towards PT goals: Progressing toward goals    Frequency    Min 3X/week      PT Plan Current plan remains appropriate    Co-evaluation              AM-PAC PT "6 Clicks" Daily Activity  Outcome Measure  Difficulty turning over in bed (including adjusting bedclothes, sheets and blankets)?: Unable Difficulty moving from lying on back to sitting on the side of the bed? : Unable Difficulty sitting down on and standing up from a chair with arms (e.g., wheelchair, bedside commode, etc,.)?: Unable Help needed moving to and from a bed to chair (including a wheelchair)?: A Little Help needed walking in hospital  room?: A Little Help needed climbing 3-5 steps with a railing? : A Lot 6 Click Score: 11    End of Session Equipment Utilized During Treatment: Gait belt;Oxygen Activity Tolerance: Patient limited by fatigue Patient left: in chair;with chair alarm set;with call bell/phone within reach;with nursing/sitter in room   PT Visit Diagnosis: Other abnormalities of gait and mobility (R26.89);Muscle weakness (generalized) (M62.81)     Time: 6244-6950 PT Time Calculation (min) (ACUTE ONLY): 23 min  Charges:  $Gait Training: 8-22 mins $Therapeutic Activity: 8-22 mins                    G Codes:  Functional Assessment Tool Used: Clinical judgement;AM-PAC 6 Clicks Basic Mobility    Deniece Ree PT, DPT, CBIS  Supplemental Physical Therapist [place]

## 2017-11-30 NOTE — Consult Note (Addendum)
WOC consult requested for right foot wound.  This was already performed on 12/29; refer to previous progress notes for assessment and measurements.  Site was draining and Una boots were due to be changed on Sat.  Called bedside nurse to discuss plan of care; she has contacted ortho tech to re-apply [employer].  These are currently out of stock and ortho tech will re-apply when they are delivered later today.  Foam dressing applied to the site until ortho tech is able to re-apply bilat Una boots; this can be removed when the wraps are applied later.  Pt will need home health follow-up for continued compression wraps; please order upon discharge. Please re-consult if further assistance is needed.  Thank-you,  Julien Girt MSN, [place], [place], [place], [place]

## 2017-11-30 NOTE — Progress Notes (Signed)
  Date: 11/30/2017  Patient name: [name]  Medical record number: [id number]  Date of birth: 08-Dec-1931   I have seen and evaluated this patient and I have discussed the plan of care with the house staff. Please see their note for complete details. I concur with their findings with the following additions/corrections:   [name] reports feeling "blah" today. She was net -1 L yesterday, but her weight continues to remain steady at 199 pounds. On exam, she is still volume overloaded with JVD, crackles, and lower extremity edema. Appreciate Dr. Wynonia Lawman with cardiology seeing her, and we will continue to diurese her. She is well rate controlled for now, with heart rates in the 60s to 80s.  She was complaining of increased foot pain today, and examination revealed a large ulceration on the right dorsum of her foot where she had previously dropped paint can on her foot. The ulcer is expanded somewhat from the time of admission, but still does not appear infected with no surrounding erythema or warmth. Likely, her poor wound healing is related to her edema, venous stasis, and overall health. We will continue wound care for her foot and reassess the need for antibiotics if she has a change in exam.  We will continue her increased dose of levothyroxine for her profound hypothyroidism. In the long run, it may be advantageous to get her off of amiodarone if possible to enable better control of her hypothyroidism.  [name], MD 11/30/2017, 4:52 PM

## 2017-11-30 NOTE — Progress Notes (Signed)
Subjective:  She is -6 L but her weight is essentially unchanged.  I question the accuracy of the weights.  Her legs are unwrapped and she has a large ulcer present on the dorsum of the foot.  No chest pain.  Breathing appears somewhat better.  Renal function improving with diuresis.  Atrial fibrillation rate controlled.    Objective:  Vital Signs in the last 24 hours: BP (!) 114/50 (BP Location: Left Arm)   Pulse 76   Temp 97.6 F (36.4 C) (Oral)   Resp 18   Ht 5\' 4"  (1.626 m)   Wt 90.5 kg (199 lb 8.3 oz)   SpO2 100%   BMI 34.25 kg/m   Physical Exam: Elderly obese white female somewhat hard of hearing Lungs:  Reduced breath sounds  Cardiac:  Irregular rhythm, normal S1 and S2, no S3, 2/6 systolic murmur at apex Extremities:  Legs unwrapped today, large ulcer clean on dorsum of right foot.  1+ edema. 1+ edema.  Intake/Output from previous day: 01/02 0701 - 01/03 0700 In: 600 [P.O.:600] Out: 1550 [Urine:1550] Weight Filed Weights   11/28/17 0735 11/29/17 0447 11/30/17 0428  Weight: 90.7 kg (199 lb 15.3 oz) 91.3 kg (201 lb 4.8 oz) 90.5 kg (199 lb 8.3 oz)    Lab Results: Basic Metabolic Panel: Recent Labs    11/29/17 0621 11/30/17 0428  NA 134* 137  K 4.9 4.6  CL 89* 94*  CO2 39* 34*  GLUCOSE 118* 108*  BUN 27* 26*  CREATININE 1.40* 1.26*    BNP    Component Value Date/Time   BNP 550.6 (H) 11/24/2017 1155   Telemetry: Atrial fibrillation currently rate controlled  Assessment/Plan:  1.  Acute on chronic combined systolic and diastolic heart failure-appears to be responding to increased diuretics 2.  Profound hypothyroidism 3.  Chronic atrial fibrillation currently rate controlled currently 4.  Long-term use of anticoagulation 5.  Medical noncompliance 6.  chronic kidney disease improved with diuresis.  May need to readjust Eliquis  Recommendations:  In term continue diuresis.  Since she lives alone and her post hospital follow-up is can be very important  to ensure compliance.s of consideration for AV ablation best to treat medically.  I would continue diuersis.      Kerry Hough  MD [facility]  11/30/2017, 12:14 PM

## 2017-12-01 LAB — BASIC METABOLIC PANEL
Anion gap: 6 (ref 5–15)
BUN: 30 mg/dL — AB (ref 6–20)
CALCIUM: 8.6 mg/dL — AB (ref 8.9–10.3)
CO2: 42 mmol/L — ABNORMAL HIGH (ref 22–32)
CREATININE: 1.34 mg/dL — AB (ref 0.44–1.00)
Chloride: 89 mmol/L — ABNORMAL LOW (ref 101–111)
GFR calc Af Amer: 41 mL/min — ABNORMAL LOW (ref >60)
GFR, EST NON AFRICAN AMERICAN: 36 mL/min — AB (ref >60)
GLUCOSE: 109 mg/dL — AB (ref 65–99)
POTASSIUM: 4.5 mmol/L (ref 3.5–5.1)
Sodium: 137 mmol/L (ref 135–145)

## 2017-12-01 NOTE — Progress Notes (Signed)
Orthopedic Tech Progress Note Patient Details:  [name] 08-Jan-1932 [id number]  Patient ID: [name], female   DOB: [birth year], 84 y.o.   MRN: [id number]   [name] 12/01/2017, 9:06 [employer] out of unna boot wrap.

## 2017-12-01 NOTE — Progress Notes (Signed)
   Subjective: [name] was seen resting in a chair at bedside this morning. She mentioned that she well apart from feeling slightly "iky". The patient stated that she felt that she was breathing better and her edema is getting better.   Objective:  Vital signs in last 24 hours: Vitals:   11/30/17 1110 11/30/17 2021 12/01/17 0449 12/01/17 0532  BP:  123/86 125/67   Pulse: 76 79 69   Resp:  20 20   Temp:  97.8 F (36.6 C) 97.7 F (36.5 C)   TempSrc:  Oral Oral   SpO2:  100% 98%   Weight:    (P) 199 lb 6.4 oz (90.4 kg)  Height:       Physical Exam  Constitutional: She appears well-developed and well-nourished. No distress.  HENT:  Head: Normocephalic and atraumatic.  Eyes: Conjunctivae are normal.  Neck: JVD present.  Cardiovascular: Normal rate, regular rhythm and normal heart sounds.  Respiratory: Effort normal. No respiratory distress. She has rales.  GI: Soft. Bowel sounds are normal. She exhibits no distension. There is no tenderness.  Musculoskeletal: She exhibits edema (2+ pitting in lower extremity).  Neurological: She is alert.  Skin: No rash noted. She is not diaphoretic. No erythema.  Psychiatric: She has a normal mood and affect. Her behavior is normal. Judgment and thought content normal.   Assessment/Plan:  Acute on chronic HFrEF: The patient is likely to have an exacerbation of heart failure due to her thyroid abnormalities.   Today 12/02/17 the patient was still volume overloaded with 2+ pitting edema, rales on lung auscultation, and jvd noted. She put out -883mL over the past 24 hrs. Her cr=1.34 and hco3=42.  -Appreciate cardiology recs -Continue lasix 80mg  BID today -Heart healthy with fluid restriction to 1254mL  Right Dorsal Foot Ulceration: The patient's foot had bandage placed on it today.   -wound care placed foam dressing to site until ortho tech can reapply una boots as they are currently out of stock -continue UNNA boots   CKD III The  patient's cr=1.34 this morning 12/01/17  -followdailyBMP  Chronic Atrial Fibrillation  Medical managing atrial fib witheliquis2.5mg  bid, amiodarone 200mg  po daily, diltiazem 180mg , atenolol 150mg . The patient is noted to have bradycardia over the course of the past day. Will discuss with cardiology regarding dosage adjustment.  -on discharge sleep study to check for OSA -continue cardiac monitoring  Hypothyroidism  -Continue levothyroxine 21mcg. -Oupatient endocrinology follow up necessary for dosage adjustment with concomitant amiodarone use.   Dispo: Anticipated discharge in approximately 1-2 day(s).   Mikayla Mage, MD Internal Medicine PGY1 [phone] 11/30/2017, 6:29 AM

## 2017-12-01 NOTE — Progress Notes (Addendum)
Pt on RA saturatring at 88%, went as low as 80%, put pt back on O2 at 1L currently sat at 93%.

## 2017-12-01 NOTE — Progress Notes (Signed)
Subjective:  Says she feels somewhat better this morning.  Says breathing is better.  Some pain involving her right foot.  Atrial fibrillation rate remains controlled.  Weight has been variable and difficult to tell although her output is -7 L since admission.  Renal function despite this has remained relatively stable.    Objective:  Vital Signs in the last 24 hours: BP (!) 124/53 (BP Location: Right Arm)   Pulse 69   Temp 97.7 F (36.5 C) (Oral)   Resp 20   Ht 5\' 4"  (1.626 m)   Wt 90.4 kg (199 lb 6.4 oz)   SpO2 100%   BMI 34.23 kg/m   Physical Exam: Elderly obese white female somewhat hard of hearing Lungs:  Reduced breath sounds  Cardiac:  Irregular rhythm, normal S1 and S2, no S3, 2/6 systolic murmur at apex Extremities:  Legs unwrapped today, large ulcer clean on dorsum of right foot.  1+ edema. 1+ edema.  Intake/Output from previous day: 01/03 0701 - 01/04 0700 In: 480 [P.O.:480] Out: 1350 [Urine:1350] Weight Filed Weights   11/29/17 0447 11/30/17 0428 12/01/17 0532  Weight: 91.3 kg (201 lb 4.8 oz) 90.5 kg (199 lb 8.3 oz) 90.4 kg (199 lb 6.4 oz)    Lab Results: Basic Metabolic Panel: Recent Labs    11/30/17 0428 12/01/17 0508  NA 137 137  K 4.6 4.5  CL 94* 89*  CO2 34* 42*  GLUCOSE 108* 109*  BUN 26* 30*  CREATININE 1.26* 1.34*    BNP    Component Value Date/Time   BNP 550.6 (H) 11/24/2017 1155   Telemetry: Atrial fibrillation currently rate controlled  Assessment/Plan:  1.  Acute on chronic combined systolic and diastolic heart failure-appears to be responding to increased diuretics breathing is better. 2.  Profound hypothyroidism 3.  Chronic atrial fibrillation currently rate controlled currently 4.  Long-term use of anticoagulation 5.  Medical noncompliance 6.  chronic kidney disease improved with diuresis.  May need to readjust Eliquis  Recommendations:   Since she lives alone and her post hospital follow-up is can be very important to  ensure compliance. plans for her to go to skilled nursing and this is being looked at.  Patient is agreeable.  Would transition to oral medicine later on today.  Kerry Hough  MD [facility]  12/01/2017, 8:58 AM

## 2017-12-01 NOTE — Progress Notes (Signed)
  Date: 12/01/2017  Patient name: [name]  Medical record number: [id number]  Date of birth: 09-Dec-1931   I have seen and evaluated this patient and I have discussed the plan of care with the house staff. Please see their note for complete details. I concur with their findings with the following additions/corrections:   Once again today, Mrs. Hoobler reports feeling "icky", but appears to be breathing a little bit easier. Her weight remains steady at 199 pounds, but her lower extremity edema seems slightly decreased as does her JVD. She continues to have moderate crackles in both lungs. Hopefully she is progressing towards her dry weight and may be ready to transition to by mouth diuretics soon, with plan to discharge to SNF. She did have some episodes of bradycardia on telemetry, although her recorded vitals show heart rates more in the range of 60-80. We'll continue to watch for any further bradycardia.  [name], MD 12/01/2017, 2:16 PM

## 2017-12-01 NOTE — Progress Notes (Signed)
Pt sat at 100% on 2 L, decrease to 1 and pt is saturating at 97%. Will continue to monitor and wean off.

## 2017-12-01 NOTE — Clinical Social Work Note (Signed)
30 day note on front of chart to be signed for PASARR. MD aware.  Dayton Scrape, [place]

## 2017-12-02 DIAGNOSIS — I5023 Acute on chronic systolic (congestive) heart failure: Secondary | ICD-10-CM

## 2017-12-02 DIAGNOSIS — I481 Persistent atrial fibrillation: Secondary | ICD-10-CM

## 2017-12-02 LAB — BASIC METABOLIC PANEL
Anion gap: 10 (ref 5–15)
BUN: 31 mg/dL — ABNORMAL HIGH (ref 6–20)
CO2: 40 mmol/L — ABNORMAL HIGH (ref 22–32)
Calcium: 8.6 mg/dL — ABNORMAL LOW (ref 8.9–10.3)
Chloride: 86 mmol/L — ABNORMAL LOW (ref 101–111)
Creatinine, Ser: 1.41 mg/dL — ABNORMAL HIGH (ref 0.44–1.00)
GFR calc Af Amer: 39 mL/min — ABNORMAL LOW (ref >60)
GFR calc non Af Amer: 33 mL/min — ABNORMAL LOW (ref >60)
Glucose, Bld: 119 mg/dL — ABNORMAL HIGH (ref 65–99)
Potassium: 4.1 mmol/L (ref 3.5–5.1)
Sodium: 136 mmol/L (ref 135–145)

## 2017-12-02 MED ORDER — SODIUM CHLORIDE 0.9% FLUSH: 3.0000 mL | INTRAVENOUS | Status: DC | PRN

## 2017-12-02 MED ORDER — FUROSEMIDE 10 MG/ML IJ SOLN
80.0000 mg | Freq: Three times a day (TID) | INTRAMUSCULAR | Status: DC
  Administered 2017-12-02 – 2017-12-04 (×7): 80 mg via INTRAVENOUS
  Filled 2017-12-02 (×7): qty 8

## 2017-12-02 MED ORDER — SODIUM CHLORIDE 0.9 % IV SOLN: 250.0000 mL | INTRAVENOUS | Status: DC

## 2017-12-02 MED ORDER — METOLAZONE 2.5 MG PO TABS
2.5000 mg | ORAL_TABLET | Freq: Every day | ORAL | Status: DC
  Administered 2017-12-02 – 2017-12-04 (×3): 2.5 mg via ORAL
  Filled 2017-12-02 (×3): qty 1

## 2017-12-02 MED ORDER — SODIUM CHLORIDE 0.9% FLUSH
3.0000 mL | Freq: Two times a day (BID) | INTRAVENOUS | Status: DC
  Administered 2017-12-02 – 2017-12-06 (×5): 3 mL via INTRAVENOUS

## 2017-12-02 MED ORDER — METOPROLOL SUCCINATE ER 50 MG PO TB24
50.0000 mg | ORAL_TABLET | Freq: Every day | ORAL | Status: DC
  Administered 2017-12-03 – 2017-12-08 (×4): 50 mg via ORAL
  Filled 2017-12-02 (×6): qty 1

## 2017-12-02 MED ORDER — SENNA 8.6 MG PO TABS: 1.0000 | ORAL_TABLET | Freq: Every day | ORAL | Status: DC | PRN

## 2017-12-02 MED ORDER — SPIRONOLACTONE 12.5 MG HALF TABLET
12.5000 mg | ORAL_TABLET | Freq: Every day | ORAL | Status: DC
  Administered 2017-12-02 – 2017-12-08 (×7): 12.5 mg via ORAL
  Filled 2017-12-02 (×8): qty 1

## 2017-12-02 MED ORDER — SODIUM CHLORIDE 0.9% FLUSH: 3.0000 mL | Freq: Two times a day (BID) | INTRAVENOUS | Status: DC

## 2017-12-02 NOTE — Progress Notes (Signed)
RN ordered TED hose. Will apply once they arrive.

## 2017-12-02 NOTE — Consult Note (Addendum)
Advanced Heart Failure Team Consult Note   Primary Physician: Primary Cardiologist:  Wynonia Lawman  Reason for Consultation: HF management  HPI:    [name] is seen today for evaluation of systolic HF and AF at the request of Dr. Wynonia Lawman.   She has  h/o HTN, HL, CKD 3, CAD s/p PCI LAD in 2003, PAF and CHF.   Initially followed by Dr. Rex Kras. Had PCI of LAD in 2003. F/u cath in 2012 with patent LAD stent with 40% diagonal lesion. Otherwise normal coronaries. She was subsequently seen by Dr. Einar Gip before switching to Dr. Wynonia Lawman in 2015.  She presented to Dr. Wynonia Lawman in 4/18  and found to be in new onset AF with HF. Started on amio with plans for DC-CV. However she did not f/u. She was admitted in was readmitted in July and found to be profoundly hypothyroid as well as with rapid atrial fibrillation.  She was diuresed during that admission and was feeling much better. Over the past 6 months she reports that she has grown increasingly weak and now unable to leave the house and grandchildren are doing most of her errands for her.   She was readmitted about a week ago with progressive volume overload, orthopnea and PND. She has been on IV lasix with good diuresis but weight is unchanged. She remains in AF. Amio stopped due to persistent hypothyroidism with TSH 108 (down from 138) however T4 only mildly decreased at 0.41.   Echo EF 35-40% with moderate RV dysfunction and severe MR with dyssynchrony due to LBBB. (EF 45-50% in 7/18)   Dr. Wynonia Lawman has proposed DC-CV but was concerned about anesthesia risk with hypothyroidism. AVN ablation with CRT has also been discussed with EP but felt not to be ideal candidate. Of note, patient dropped a can of soup on her foot 2 weeks ago and has large wound on her R foot. She has been on Eliquis since earlier this year without missing.   Review of Systems: [y] = yes, [ ]  = no   General: Weight gain [ y]; Weight loss [ ] ; Anorexia Blue.Reese ]; Fatigue [ y]; Fever [ ] ;  Chills [ ] ; Weakness [ y]  Cardiac: Chest pain/pressure [ ] ; Resting SOB Blue.Reese ]; Exertional SOB Blue.Reese ]; Orthopnea Blue.Reese ]; Pedal Edema Blue.Reese ]; Palpitations [ ] ; Syncope [ ] ; Presyncope [ ] ; Paroxysmal nocturnal dyspnea[y ]  Pulmonary: Cough [ ] ; Wheezing[ ] ; Hemoptysis[ ] ; Sputum [ ] ; Snoring [ ]   GI: Vomiting[ ] ; Dysphagia[ ] ; Melena[ ] ; Hematochezia [ ] ; Heartburn[ ] ; Abdominal pain [ ] ; Constipation [ y]; Diarrhea [ ] ; BRBPR [ ]   GU: Hematuria[ ] ; Dysuria [ ] ; Nocturia[ ]   Vascular: Pain in legs with walking [ ] ; Pain in feet with lying flat [ ] ; Non-healing sores [ ] ; Stroke [ ] ; TIA [ ] ; Slurred speech [ ] ;  Neuro: Headaches[ ] ; Vertigo[ ] ; Seizures[ ] ; Paresthesias[ ] ;Blurred vision [ ] ; Diplopia [ ] ; Vision changes [ ]   Ortho/Skin: Arthritis [ y]; Joint pain Blue.Reese ]; Muscle pain [ ] ; Joint swelling [ ] ; Back Pain [ ] ; Rash [ ]   Psych: Depression[y ]; Anxiety[ ]   Heme: Bleeding problems [ ] ; Clotting disorders [ ] ; Anemia [ y]  Endocrine: Diabetes [ ] ; Thyroid dysfunction[y ]  Medications  . amiodarone  200 mg Oral Daily  . apixaban  2.5 mg Oral BID  . atenolol  150 mg Oral Daily  . atorvastatin  60 mg Oral q1800  .  diltiazem  180 mg Oral Daily  . furosemide  80 mg Intravenous BID  . levothyroxine  200 mcg Oral QAC breakfast    Past Medical History: Past Medical History:  Diagnosis Date  . Anxiety    "some; not treated for it" (11/27/2014)  . Bradycardia   . CAD (coronary artery disease), native coronary artery    2003 normal left main, 80% mid LAD  2.5 x 13 mm Cypher stent to LAD Dr. Rex Kras, subsequent cath 2004 showed patent stent in LAD with no sig disease in RCA or Circ;    . CHF (congestive heart failure) ([place])   . CKD (chronic kidney disease), stage III ([place])   . GERD (gastroesophageal reflux disease)   . History of hiatal hernia   . Hyperlipidemia   . Hypertension   . LBBB (left bundle branch block)   . Migraine    "very rare now" (11/27/2014)  . Obesity (BMI 30-39.9)  06/18/2017  . On home oxygen therapy    "only at night; ? setting" (11/27/2014)  . Osteoarthritis cervical spine   . Sleep apnea 06/18/2017    Past Surgical History: Past Surgical History:  Procedure Laterality Date  . ABDOMINAL HYSTERECTOMY    . CATARACT EXTRACTION W/ INTRAOCULAR LENS  IMPLANT, BILATERAL Bilateral   . CYSTOCELE REPAIR      Family History: Family History  Problem Relation Age of Onset  . Heart attack Mother   . Hypertension Mother   . Heart Problems Mother   . Heart attack Father   . Hypertension Father     Social History: Social History   Socioeconomic History  . Marital status: Divorced    Spouse name: None  . Number of children: 3  . Years of education: college  . Highest education level: None  Social Needs  . Financial resource strain: None  . Food insecurity - worry: None  . Food insecurity - inability: None  . Transportation needs - medical: None  . Transportation needs - non-medical: None  Occupational History  . Occupation: Retired   Tobacco Use  . Smoking status: Never Smoker  . Smokeless tobacco: Never Used  Substance and Sexual Activity  . Alcohol use: No    Alcohol/week: 0.0 oz  . Drug use: No  . Sexual activity: No  Other Topics Concern  . None  Social History Narrative   Drinks tea, occasional coffee    Allergies:  Allergies  Allergen Reactions  . Ampicillin Shortness Of Breath, Swelling and Rash  . Hydromorphone Hcl Shortness Of Breath, Itching, Swelling and Rash  . Penicillins Anaphylaxis    Has patient had a PCN reaction causing immediate rash, facial/tongue/throat swelling, SOB or lightheadedness with hypotension: Yes Has patient had a PCN reaction causing severe rash involving mucus membranes or skin necrosis: Unknown Has patient had a PCN reaction that required hospitalization: Yes (was in the hosp) Has patient had a PCN reaction occurring within the last 10 years: No If all of the above answers are "NO", then may  proceed with Cephalosporin use.     Objective:    Vital Signs:   Temp:  [97.6 F (36.4 C)-98.3 F (36.8 C)] 97.6 F (36.4 C) (01/05 1149) Pulse Rate:  [66-76] 66 (01/05 1149) Resp:  [16-18] 18 (01/05 1149) BP: (111-117)/(55-65) 117/55 (01/05 1149) SpO2:  [88 %-100 %] 100 % (01/05 1149) Weight:  [90.9 kg (200 lb 6.4 oz)] 90.9 kg (200 lb 6.4 oz) (01/05 0552) Last BM Date: 11/29/17  Weight change: Danley Danker  Weights   11/30/17 0428 12/01/17 0532 12/02/17 0552  Weight: 90.5 kg (199 lb 8.3 oz) 90.4 kg (199 lb 6.4 oz) 90.9 kg (200 lb 6.4 oz)    Intake/Output:   Intake/Output Summary (Last 24 hours) at 12/02/2017 1554 Last data filed at 12/02/2017 1350 Gross per 24 hour  Intake 960 ml  Output 550 ml  Net 410 ml      Physical Exam    General:  Elderly woman lying in bed. NAD HOH HEENT: normal Neck: supple. JVP to jaw Carotids 2+ bilat; no bruits. No lymphadenopathy or thyromegaly appreciated. Cor: PMI nondisplaced. IRR IRR distant + 2/6 MR  Lungs: + basilar crackles  Abdomen: soft, nontender, nondistended. No hepatosplenomegaly. No bruits or masses. Good bowel sounds. Extremities: no cyanosis, clubbing, rash, 2-3+ upper and lower extremity edema  Large wound on dorsum of R foot Neuro: alert & orientedx3, cranial nerves grossly intact except for hearing  moves all 4 extremities w/o difficulty. Affect pleasant   Telemetry   AF 70s LBBB Personally reviewed   EKG    AF 125 LBBB .dbpe   Labs   Basic Metabolic Panel: Recent Labs  Lab 11/28/17 0359 11/29/17 0621 11/30/17 0428 12/01/17 0508 12/02/17 1153  NA 135 134* 137 137 136  K 3.6 4.9 4.6 4.5 4.1  CL 88* 89* 94* 89* 86*  CO2 37* 39* 34* 42* 40*  GLUCOSE 109* 118* 108* 109* 119*  BUN 23* 27* 26* 30* 31*  CREATININE 1.50* 1.40* 1.26* 1.34* 1.41*  CALCIUM 8.1* 8.4* 8.5* 8.6* 8.6*    Liver Function Tests: Recent Labs  Lab 11/27/17 0543  AST 42*  ALT 27  ALKPHOS 75  BILITOT 0.7  PROT 5.2*  ALBUMIN 2.4*    No results for input(s): LIPASE, AMYLASE in the last 168 hours. No results for input(s): AMMONIA in the last 168 hours.  CBC: Recent Labs  Lab 11/26/17 0414  WBC 8.6  HGB 9.7*  HCT 32.2*  MCV 87.3  PLT 263    Cardiac Enzymes: No results for input(s): CKTOTAL, CKMB, CKMBINDEX, TROPONINI in the last 168 hours.  BNP: BNP (last 3 results) Recent Labs    06/17/17 1644 11/24/17 1155  BNP 652.8* 550.6*    ProBNP (last 3 results) No results for input(s): PROBNP in the last 8760 hours.   CBG: No results for input(s): GLUCAP in the last 168 hours.  Coagulation Studies: No results for input(s): LABPROT, INR in the last 72 hours.   Imaging    No results found.   Medications:     Current Medications: . amiodarone  200 mg Oral Daily  . apixaban  2.5 mg Oral BID  . atenolol  150 mg Oral Daily  . atorvastatin  60 mg Oral q1800  . diltiazem  180 mg Oral Daily  . furosemide  80 mg Intravenous BID  . levothyroxine  200 mcg Oral QAC breakfast     Infusions:     Patient Profile   84 y/o woman with h/o CAD s/p LAD stent 2003, HTN, HL, LBBB and persistent AF (since 4/18) admitted with recurrent HF and EF drop to 35-40%   Assessment/Plan   1. Acute on chronic systolic HF with biventricualr failure - Echo reviewed personally EF 35-40% with moderate RV dysfunction and severe MR with dyssynchrony due to LBBB. (EF 45-50% in 7/18)  - suspect EF down due to AF and dyssynchrony from LBBB. ? Component of myxedema. - has significant volume overload.  - Will increase lasix  to 80 IV tid and add metolazone. Add TED hose - Need to stop diltiazem with low EF. Switch atenolol to toprol - I suspect she will do better in NSR +/- CRT.  - Likely has enough amio on board to support successful DC-CV. Will schedule for Monday. Although TSH high. T4 is only mildly reduced and likely safe for anesthesia.  - Can consider CRT once foot wound heals (not candidate for implanted device  with open wound)  2. Persistent AF - plan DC-CV on Monday as above - continue low-dose amio for now - continue Eliquis. Currently at 2.5 bid. If creatine remains below 1.5 will need to increase to 5 bid.   3. LBBB - see above. Suspect dyssynchrony playing a role in LV dysfunction. Consider CRT (Biv pacer once foot wound heals)  4. Severe mitral regurgitation - secondary due to LV dilation. Not candidate for MV clip. Suspect it will improve with improvement in HF  5. CAD s/p LAD stent 2003 - no s/s ischemia. Cath 2012 with patent stent and otherwise minimal CAD - continue statin. Off ASA with Eliquis  6. Foot wound - WOC following  7. CKD III - stable. Follow with diuresis.  8. Hypothyroidism - suspect due to amio. Remains on low dose amio. May need to stop. Continue synthroid. Although TSH high. T4 is only mildly reduced and likely safe for anesthesia.    Length of Stay: [place], MD  12/02/2017, 3:54 PM  Advanced Heart Failure Team Pager [phone] (M-F; 7a - 4p)  Please contact [facility] for night-coverage after hours (4p -7a ) and weekends on [email or web address]

## 2017-12-02 NOTE — Progress Notes (Signed)
Large TED hose is too small for pt's leg. RN ordered XL TED hose.

## 2017-12-02 NOTE — Progress Notes (Addendum)
   Subjective: [name] was seen doing well this morning. She reported that she continues to feel short of breath and have right leg pain. She felt slightly lightheaded and dizzy yesterday, but not today. The patient states that she feels that she continues to have edema.   Objective:  Vital signs in last 24 hours: Vitals:   12/01/17 2315 12/02/17 0552 12/02/17 0746 12/02/17 0940  BP:  (!) 111/57 (!) 111/55 115/65  Pulse: 71 66  68  Resp: 16 16    Temp:  98.2 F (36.8 C)    TempSrc:  Oral    SpO2: 99% 99%    Weight:  200 lb 6.4 oz (90.9 kg)    Height:       Physical Exam  Constitutional: She appears well-developed and well-nourished. No distress.  HENT:  Head: Normocephalic and atraumatic.  Eyes: Conjunctivae are normal.  Neck: JVD (3cm above angle of louis) present.  Cardiovascular: Normal rate, regular rhythm, normal heart sounds and intact distal pulses.  Respiratory: Effort normal and breath sounds normal. No respiratory distress. She has no wheezes.  GI: Soft. Bowel sounds are normal. She exhibits no distension. There is no tenderness.  Neurological: She is alert.  Skin: She is not diaphoretic. There is erythema (dorsal aspect of right foot).  Psychiatric: She has a normal mood and affect. Her behavior is normal. Judgment and thought content normal.    Assessment/Plan:  Acute on chronic HFrEF: The patient is likely to have an exacerbation of heart failure due to her thyroid abnormalities.  The patient continues to have bilateral upper extremity edema, lungs seem clear of any crackles, mild amount of jvd noted. The patient put out -844mL over the past 24 hrs.BMP pending. The patient's bicarb has been going up consistently over the past few days and there is concern for contraction alkalosis and as to whether the patient will benefit from acetazolamide.   -Appreciate cardiology recs -Continue lasix 80mg  BID today -Heart healthy with fluid restriction to 1229mL -Need  to wean oxygen-currently on 2L via [place]  Right Dorsal Foot Ulceration: The patient's foot appears to be bleeding with current bandage, no discharge.   -wound care needs to rewrap and place una boot  CKD III  -followdailyBMP  Chronic Atrial Fibrillation  Medical managing atrial fib witheliquis2.5mg  bid, amiodarone 200mg  po daily, diltiazem 180mg , atenolol 150mg .   -on discharge sleep study to check for OSA -continue cardiac monitoring- telemetry showing atrial fibrillation with hr 60-80s today 12/02/16  Hypothyroidism  -Continue levothyroxine 256mcg. -Oupatient endocrinology follow up necessary for dosage adjustment with concomitant amiodarone use.  Dispo: Anticipated discharge in approximately 1-2 day(s).   Lars Mage, MD Internal Medicine PGY1 Pager:[phone] 12/02/2017, 11:01 AM

## 2017-12-02 NOTE — Progress Notes (Signed)
Subjective:  Doesn't feel well today.  Still some shortness of breath.  Despite -7 L of diuresis no appreciable change in weight.  Edema of both upper extremities.    Objective:  Vital Signs in the last 24 hours: BP (!) 117/55 (BP Location: Right Arm)   Pulse 66   Temp 97.6 F (36.4 C) (Oral)   Resp 18   Ht 5\' 4"  (1.626 m)   Wt 90.9 kg (200 lb 6.4 oz)   SpO2 100%   BMI 34.40 kg/m   Physical Exam: Elderly obese white female somewhat hard of hearing Lungs:  Reduced breath sounds  Cardiac:  Irregular rhythm, normal S1 and S2, no S3, 2/6 systolic murmur at apex Extremities:  Legs unwrapped today, large ulcer clean on dorsum of right foot.  1+ edema.  Both of her upper arms are significantly edematous and swollen.    Intake/Output from previous day: 01/04 0701 - 01/05 0700 In: 720 [P.O.:720] Out: 1550 [Urine:1550] Weight Filed Weights   11/30/17 0428 12/01/17 0532 12/02/17 0552  Weight: 90.5 kg (199 lb 8.3 oz) 90.4 kg (199 lb 6.4 oz) 90.9 kg (200 lb 6.4 oz)    Lab Results: Basic Metabolic Panel: Recent Labs    11/30/17 0428 12/01/17 0508  NA 137 137  K 4.6 4.5  CL 94* 89*  CO2 34* 42*  GLUCOSE 108* 109*  BUN 26* 30*  CREATININE 1.26* 1.34*    BNP    Component Value Date/Time   BNP 550.6 (H) 11/24/2017 1155   Telemetry: Atrial fibrillation currently rate controlled  Assessment/Plan:  1.  Acute on chronic combined systolic and diastolic heart failure-appears to be responding to increased diuretics breathing is better however weight is not really well controlled.. 2.  Profound hypothyroidism 3.  Chronic atrial fibrillation currently rate controlled currently 4.  Long-term use of anticoagulation 5.  Medical noncompliance 6.  Chronic kidney disease-values are pending today.  Recommendations:  Difficult picture with medical noncompliance in the past and somewhat refractory to diuresis although she is negative fluid balance.  Remains in atrial fibrillation with  controlled response.  With her hypothyroidism previously at scan of be very difficult for anesthesia or cardioversion until this is corrected.  She does have mitral regurgitation and reduced LV function.  At this point with her rate control but I think him to stop her amiodarone since this is contributing to her hypothyroidism.  Continue her other medications.  I would like and ask an opinion from the heart failure folks to see if they have any other ideas for her treatment at this time.Kerry Hough  MD [facility]  12/02/2017, 12:06 PM

## 2017-12-03 LAB — BASIC METABOLIC PANEL
Anion gap: 10 (ref 5–15)
BUN: 37 mg/dL — ABNORMAL HIGH (ref 6–20)
CO2: 40 mmol/L — ABNORMAL HIGH (ref 22–32)
Calcium: 8.7 mg/dL — ABNORMAL LOW (ref 8.9–10.3)
Chloride: 82 mmol/L — ABNORMAL LOW (ref 101–111)
Creatinine, Ser: 1.56 mg/dL — ABNORMAL HIGH (ref 0.44–1.00)
GFR calc Af Amer: 34 mL/min — ABNORMAL LOW (ref >60)
GFR calc non Af Amer: 30 mL/min — ABNORMAL LOW (ref >60)
Glucose, Bld: 113 mg/dL — ABNORMAL HIGH (ref 65–99)
Potassium: 3.5 mmol/L (ref 3.5–5.1)
Sodium: 132 mmol/L — ABNORMAL LOW (ref 135–145)

## 2017-12-03 NOTE — Progress Notes (Signed)
   Subjective: [name] was seen resting in her bed this morning. She was pleasant and doing well. She stated that her breathing is doing better. She mentioned that she had mild pain in her right foot that was alleviated with tylenol.  Objective:  Vital signs in last 24 hours: Vitals:   12/02/17 0940 12/02/17 1149 12/02/17 1956 12/03/17 0509  BP: 115/65 (!) 117/55 (!) 111/58 (!) 121/55  Pulse: 68 66 71 69  Resp:  18 16 18   Temp:  97.6 F (36.4 C) 97.9 F (36.6 C) 97.8 F (36.6 C)  TempSrc:  Oral Oral Oral  SpO2:  100% 100% 95%  Weight:    198 lb 3.2 oz (89.9 kg)  Height:       Physical Exam  Constitutional: She appears well-developed and well-nourished. No distress.  HENT:  Head: Normocephalic and atraumatic.  Eyes: Conjunctivae are normal.  Cardiovascular: Normal rate, regular rhythm and normal heart sounds.  Respiratory: Effort normal and breath sounds normal. No respiratory distress. She has no wheezes.  GI: Soft. Bowel sounds are normal. She exhibits no distension. There is no tenderness.  Musculoskeletal: She exhibits edema (bilateral upper extremities).  Neurological: She is alert.  Skin: No rash noted. She is not diaphoretic. There is erythema (dorsal aspect of right foot).  Psychiatric: She has a normal mood and affect. Her behavior is normal. Judgment and thought content normal.   Assessment/Plan:  Acute on chronic HFrEF: The patient is likely to have an exacerbation of heart failure due to her thyroid abnormalities.  The patient is now being follow by advanced heart failure team. She has put out 1.9L fluid over past 24 hrs and the patient . The patient does not have crackles on exam, mild amount of jvd noted, bilateral upper extremities with edema still. The patient's bicarb=40 and cr=1.56.  -Appreciate cardiology recs -Continue Increased lasix 80mg  TID  -Continue metolazone 2.5mg  qd -Continue metoprolol XL 50mg  qd -TED hose -Heart healthy with fluid  restriction to 1288mL -Need to wean oxygen-currently on 2L via [place]  Right Dorsal Foot Ulceration: The patient's footappears to be bleeding with current bandage, no discharge.   -wound careneeds to rewrap and place una boot  CKD III  -followdailyBMP  Chronic Atrial Fibrillation  -continue amiodarone 200mg  qd -Continue eliquis 2.5mg  bid -Continue toprol 50mg  qd -Diltiazem discontinued -DC-CV on 12/04/17  Hypothyroidism  -Continue levothyroxine 223mcg. -Oupatient endocrinology follow upnecessaryfor dosage adjustment with concomitant amiodarone use.  Dispo: Anticipated discharge in approximately 2-3 day(s).   Lars Mage, MD Internal Medicine PGY1 [phone] 12/03/2017, 6:25 AM

## 2017-12-03 NOTE — Progress Notes (Signed)
Subjective:  Still edematous mostly in the arms but still some on the leg.  Remains in atrial fibrillation with controlled response.  Dr. Clayborne Dana consultation greatly appreciated.  Have discussed with him and he will arrange for cardioversion tomorrow to see if this will help.    Objective:  Vital Signs in the last 24 hours: BP (!) 113/54 (BP Location: Right Arm)   Pulse 86   Temp 97.8 F (36.6 C) (Oral)   Resp 14   Ht 5\' 4"  (1.626 m)   Wt 89.9 kg (198 lb 3.2 oz)   SpO2 94%   BMI 34.02 kg/m   Physical Exam: Elderly obese white female somewhat hard of hearing Lungs:  Reduced breath sounds  Cardiac:  Irregular rhythm, normal S1 and S2, no S3, 2/6 systolic murmur at apex Extremities:  Legs unwrapped today, large ulcer clean on dorsum of right foot.  1+ edema.  Both of her upper arms are significantly edematous and swollen.    Intake/Output from previous day: 01/05 0701 - 01/06 0700 In: 840 [P.O.:840] Out: 2800 [Urine:2800] Weight Filed Weights   12/01/17 0532 12/02/17 0552 12/03/17 0509  Weight: 90.4 kg (199 lb 6.4 oz) 90.9 kg (200 lb 6.4 oz) 89.9 kg (198 lb 3.2 oz)    Lab Results: Basic Metabolic Panel: Recent Labs    12/02/17 1153 12/03/17 0407  NA 136 132*  K 4.1 3.5  CL 86* 82*  CO2 40* 40*  GLUCOSE 119* 113*  BUN 31* 37*  CREATININE 1.41* 1.56*    BNP    Component Value Date/Time   BNP 550.6 (H) 11/24/2017 1155   Telemetry: Atrial fibrillation currently rate controlled  Assessment/Plan:  1.  Acute on chronic combined systolic and diastolic heart failure-appears to be responding to increased diuretics breathing is better however weight is not really well controlled and hasn't changed despite fact that she is significantly negative 2.  Profound hypothyroidism 3.  Chronic atrial fibrillation currently rate controlled currently 4.  Long-term use of anticoagulation 5.  Medical noncompliance 6.  Chronic kidney disease-slightly worse today.     Recommendations:  Appreciate changes from Dr. Haroldine Laws.  Cardioversion is planned tomorrow and she understands risks.   Kerry Hough  MD [facility]  12/03/2017, 11:21 AM

## 2017-12-03 NOTE — Progress Notes (Signed)
Internal Medicine Attending:   I saw and examined the patient. I reviewed Dr Thea Gist note and I agree with the resident's findings and plan as documented in the resident's note.

## 2017-12-03 NOTE — Progress Notes (Signed)
Pt states that her granddaughter brought her two hot dogs and she ate them. Box for hot dogs is in pt's trash can. RN educated pt about following a low sodium diet. Pt's granddaughter had left already so RN was unable to educate her about pt's diet. Pt states she will follow a low sodium diet from now on.

## 2017-12-04 DIAGNOSIS — R10815 Periumbilic abdominal tenderness: Secondary | ICD-10-CM

## 2017-12-04 DIAGNOSIS — E873 Alkalosis: Secondary | ICD-10-CM | POA: Diagnosis present

## 2017-12-04 LAB — BASIC METABOLIC PANEL
Anion gap: 11 (ref 5–15)
BUN: 37 mg/dL — ABNORMAL HIGH (ref 6–20)
CO2: 44 mmol/L — ABNORMAL HIGH (ref 22–32)
Calcium: 8.9 mg/dL (ref 8.9–10.3)
Chloride: 81 mmol/L — ABNORMAL LOW (ref 101–111)
Creatinine, Ser: 1.41 mg/dL — ABNORMAL HIGH (ref 0.44–1.00)
GFR calc Af Amer: 39 mL/min — ABNORMAL LOW (ref >60)
GFR calc non Af Amer: 33 mL/min — ABNORMAL LOW (ref >60)
Glucose, Bld: 97 mg/dL (ref 65–99)
Potassium: 3.7 mmol/L (ref 3.5–5.1)
Sodium: 136 mmol/L (ref 135–145)

## 2017-12-04 LAB — MAGNESIUM: MAGNESIUM: 2.2 mg/dL (ref 1.7–2.4)

## 2017-12-04 MED ORDER — SODIUM CHLORIDE 0.9% FLUSH: 3.0000 mL | INTRAVENOUS | Status: DC | PRN

## 2017-12-04 MED ORDER — SPIRONOLACTONE 12.5 MG HALF TABLET
12.5000 mg | ORAL_TABLET | Freq: Every day | ORAL | Status: DC
Start: 2017-12-05 — End: 2017-12-04

## 2017-12-04 MED ORDER — SODIUM CHLORIDE 0.9% FLUSH
3.0000 mL | Freq: Two times a day (BID) | INTRAVENOUS | Status: DC
  Administered 2017-12-04 – 2017-12-08 (×6): 3 mL via INTRAVENOUS

## 2017-12-04 MED ORDER — SODIUM CHLORIDE 0.9 % IV SOLN: 250.0000 mL | INTRAVENOUS | Status: DC

## 2017-12-04 MED ORDER — POTASSIUM CHLORIDE CRYS ER 20 MEQ PO TBCR
40.0000 meq | EXTENDED_RELEASE_TABLET | Freq: Once | ORAL | Status: AC
  Administered 2017-12-04: 40 meq via ORAL
  Filled 2017-12-04: qty 2

## 2017-12-04 MED ORDER — ACETAZOLAMIDE 250 MG PO TABS
250.0000 mg | ORAL_TABLET | Freq: Two times a day (BID) | ORAL | Status: DC
  Administered 2017-12-04 – 2017-12-05 (×2): 250 mg via ORAL
  Filled 2017-12-04 (×2): qty 1

## 2017-12-04 NOTE — Plan of Care (Signed)
  Health Behavior/Discharge Planning: Ability to manage health-related needs will improve 12/04/2017 2350 - Progressing by Tristan Schroeder, RN   Clinical Measurements: Ability to maintain clinical measurements within normal limits will improve 12/04/2017 2350 - Progressing by Tristan Schroeder, RN   Clinical Measurements: Will remain free from infection 12/04/2017 2350 - Progressing by Tristan Schroeder, RN   Clinical Measurements: Respiratory complications will improve 12/04/2017 2350 - Progressing by Tristan Schroeder, RN

## 2017-12-04 NOTE — Progress Notes (Addendum)
   Subjective: [name] was seen resting this morning in her bed. She denies any pain and states that her breathing has improved. She also feels that her right foot pain is better.   Objective:  Vital signs in last 24 hours: Vitals:   12/03/17 0909 12/03/17 1127 12/03/17 2054 12/04/17 0457  BP: (!) 113/54 (!) 93/42 (!) 106/51 (!) 123/58  Pulse: 86 65 79 73  Resp: 14 18 18 18   Temp:  97.8 F (36.6 C) 97.7 F (36.5 C) 97.8 F (36.6 C)  TempSrc:  Oral Oral Oral  SpO2: 94% 100% 100% 100%  Weight:    190 lb 6.4 oz (86.4 kg)  Height:       Physical Exam  Constitutional: She appears well-developed and well-nourished. No distress.  HENT:  Head: Normocephalic and atraumatic.  Eyes: Conjunctivae are normal.  Cardiovascular: Normal rate, normal heart sounds and intact distal pulses.  Respiratory: Effort normal and breath sounds normal. No respiratory distress. She has no wheezes.  GI: Soft. Bowel sounds are normal. There is tenderness (mild amount in umbilical region with palpation).  Musculoskeletal: She exhibits edema (upper extremities, right more predominant in comparison to left ) and tenderness (dorsal aspect of right foot ).  Skin: She is not diaphoretic.  Psychiatric: She has a normal mood and affect. Her behavior is normal. Judgment and thought content normal.   Assessment/Plan:  Acute on chronic HFrEF: The patient is likely to have an exacerbation of heart failure due to her thyroid abnormalities.The patient put out -3.5L over the past 24 hrs and she weighs 190lbs today in comparison to 198lbs. The patient's bicarb=44 and cr=1.4.  -Appreciate cardiology recs -Continue lasix 80mg  TID  -Continue metolazone 2.5mg  qd -Continue metoprolol XL 50mg  qd -TED hose -Heart healthy with fluid restriction to 1275mL -Need to wean oxygen-currently on 2L via [place] -Patient was counseled on the importance of fluid restriction and limiting foods high in salt content  Right Dorsal Foot  Ulceration: The patient's footappears to be bleeding with current bandage, no discharge.  -wound careneeds to rewrap and place una boot  CKD III  -followdailyBMP-the patient's cr=1.41 today 12/04/17  Chronic Atrial Fibrillation The patient is scheduled for cardioversion today.   -continue amiodarone 200mg  qd -Continue eliquis 2.5mg  bid -Continue toprol 50mg  qd -Diltiazem discontinued  Hypothyroidism  -Continue levothyroxine 244mcg. -Oupatient endocrinology follow upnecessaryfor dosage adjustment with concomitant amiodarone use.   Dispo: Anticipated discharge in approximately 2-3 day(s).   Lars Mage, MD Internal Medicine PGY1 Pager:[phone] 12/04/2017, 6:03 AM

## 2017-12-04 NOTE — Clinical Social Work Note (Signed)
PASARR obtained: [id number].  [place]ton Scrape, [place]

## 2017-12-04 NOTE — Progress Notes (Signed)
PT Cancellation Note  Patient Details Name: [name] MRN: [id number] DOB: 04/24/1932   Cancelled Treatment:    Reason Eval/Treat Not Completed: Patient declined, no reason specified Returned to work with patient prior to procedure today, however patient declines stating "I just feel yucky", nursing staff confirms patient has not felt well being NPO.    Deniece Ree PT, DPT, CBIS  Supplemental Physical Therapist [facility]   Pager [phone]

## 2017-12-04 NOTE — Progress Notes (Signed)
Advanced Heart Failure Rounding Note  Primary Cardiologist: Wynonia Lawman  Subjective:    Feeling OK this am. Slept well. SOB "well controlled". But still very swollen in arms and legs.  No CP. Denies lightheadedness or dizziness. Says she feels run down.   Negative 3.5 L and down 8 lbs. Creatinine stable at 1.4.  Of note, ate 2 hotdogs brought by family yesterday.   Objective:   Weight Range: 190 lb 6.4 oz (86.4 kg) Body mass index is 32.68 kg/m.   Vital Signs:   Temp:  [97.7 F (36.5 C)-97.8 F (36.6 C)] 97.8 F (36.6 C) (01/07 0457) Pulse Rate:  [65-86] 73 (01/07 0457) Resp:  [14-18] 18 (01/07 0457) BP: (93-123)/(42-58) 123/58 (01/07 0457) SpO2:  [94 %-100 %] 100 % (01/07 0457) Weight:  [190 lb 6.4 oz (86.4 kg)] 190 lb 6.4 oz (86.4 kg) (01/07 0457) Last BM Date: 12/02/17  Weight change: Filed Weights   12/02/17 0552 12/03/17 0509 12/04/17 0457  Weight: 200 lb 6.4 oz (90.9 kg) 198 lb 3.2 oz (89.9 kg) 190 lb 6.4 oz (86.4 kg)    Intake/Output:   Intake/Output Summary (Last 24 hours) at 12/04/2017 0737 Last data filed at 12/04/2017 8099 Gross per 24 hour  Intake 820 ml  Output 4351 ml  Net -3531 ml      Physical Exam    General:  Elderly. NAD. Lying in bed  HEENT: Normal Neck: Supple. JVP slightly difficult due to body habitus, but appears elevated to jaw. Carotids 2+ bilat; no bruits. No lymphadenopathy or thyromegaly appreciated. Cor: PMI nondisplaced. IRR, IRR. Distant. 2/6 Mr.  Lungs: Mild bibasilar crackles.  Abdomen: Soft, nontender, nondistended. No hepatosplenomegaly. No bruits or masses. Good bowel sounds. Extremities: No cyanosis or clubbing. 2-3+ soft, doughy, upper and lower extremity edema. + large wound on dorsum of R foot.  Neuro: Alert & orientedx3, cranial nerves grossly intact. moves all 4 extremities w/o difficulty. Affect pleasant   Telemetry   Afib 60-70s + LBBB, personally reviewed  EKG    No new tracings.   Labs    CBC No results  for input(s): WBC, NEUTROABS, HGB, HCT, MCV, PLT in the last 72 hours. Basic Metabolic Panel Recent Labs    12/03/17 0407 12/04/17 0511  NA 132* 136  K 3.5 3.7  CL 82* 81*  CO2 40* 44*  GLUCOSE 113* 97  BUN 37* 37*  CREATININE 1.56* 1.41*  CALCIUM 8.7* 8.9   Liver Function Tests No results for input(s): AST, ALT, ALKPHOS, BILITOT, PROT, ALBUMIN in the last 72 hours. No results for input(s): LIPASE, AMYLASE in the last 72 hours. Cardiac Enzymes No results for input(s): CKTOTAL, CKMB, CKMBINDEX, TROPONINI in the last 72 hours.  BNP: BNP (last 3 results) Recent Labs    06/17/17 1644 11/24/17 1155  BNP 652.8* 550.6*    ProBNP (last 3 results) No results for input(s): PROBNP in the last 8760 hours.   D-Dimer No results for input(s): DDIMER in the last 72 hours. Hemoglobin A1C No results for input(s): HGBA1C in the last 72 hours. Fasting Lipid Panel No results for input(s): CHOL, HDL, LDLCALC, TRIG, CHOLHDL, LDLDIRECT in the last 72 hours. Thyroid Function Tests No results for input(s): TSH, T4TOTAL, T3FREE, THYROIDAB in the last 72 hours.  Invalid input(s): FREET3  Other results:   Imaging     No results found.   Medications:     Scheduled Medications: . amiodarone  200 mg Oral Daily  . apixaban  2.5 mg Oral  BID  . atorvastatin  60 mg Oral q1800  . furosemide  80 mg Intravenous TID  . levothyroxine  200 mcg Oral QAC breakfast  . metolazone  2.5 mg Oral Daily  . metoprolol succinate  50 mg Oral Daily  . sodium chloride flush  3 mL Intravenous Q12H  . sodium chloride flush  3 mL Intravenous Q12H  . spironolactone  12.5 mg Oral Daily     Infusions: . sodium chloride    . sodium chloride       PRN Medications:  acetaminophen **OR** acetaminophen, senna, sodium chloride flush, sodium chloride flush    Patient Profile   84 y/o woman with h/o CAD s/p LAD stent 2003, HTN, HL, LBBB and persistent AF (since 4/18) admitted with recurrent HF and EF  drop to 35-40%     Assessment/Plan   1. Acute on chronic systolic HF with biventricualr failure - Echo personally reviewed by Dr. Haroldine Laws. Appears to have EF 35-40% with moderate RV dysfunction and severe MR with dyssynchrony due to LBBB. (EF 45-50% in 7/18)  - suspect EF down due to AF and dyssynchrony from LBBB. ? Component of myxedema. - Remains significantly volume overloaded.   - Continue lasix 80 IV TID for now.  - Continue 2.5 mg metolazone daily for now. Supp K.  - Continue TED hose.  - Diltiazem stopped with low EF.  - Atenolol switched to Toprol 50 mg daily.  - Suspect she will do better in NSR +/- CRT.  - Likely has enough amio on board to support successful DC-CV. Will schedule.  Although TSH high. T4 is only mildly reduced and likely safe for anesthesia.  - Can consider CRT once foot wound heals (not candidate for implanted device with open wound)  2. Persistent AF - Plan DC-CV on Today.  - Continue low-dose amio for now - Continue Eliquis. If creatinine remains below 1.5 will need to increase to 5 mg BID.    3. LBBB - See discussion above. Suspect dyssynchrony playing a role in LV dysfunction. Consider CRT (Biv pacer once foot wound heals)  4. Severe mitral regurgitation - Secondary to LV dilation. Not candidate for MV clip. Suspect it will improve with improvement in HF. No change.   5. CAD s/p LAD stent 2003 - No s/s of ischemia. Cath 2012 with patent stent and otherwise minimal CAD - continue statin. Off ASA with Eliquis  6. Foot wound - Appreciate WOC support.   7. CKD III - Stable in 1.3 - 1.5 range. As above, may need to increase Eliquis. Though, has recently been as high as 1.8.  8. Hypothyroidism - Suspect due to amio. Remains on low dose amio. May need to stop.  - Continue synthroid.  - Although TSH high. T4 is only mildly reduced and likely safe for anesthesia. No change.   9. Mild Hypokalemia - Supp with 40 meq K this am.  - Check Mg.     Plan for DCCV today. Continue diuresis.   Medication concerns reviewed with patient and pharmacy team. Barriers identified: Compliance.  Length of Stay: [address]  [name]  12/04/2017, 7:37 AM  Advanced Heart Failure Team Pager [phone] (M-F; 7a - 4p)  Please contact [facility] for night-coverage after hours (4p -7a ) and weekends on [email or web address]  Patient seen and examined with the above-signed Advanced Practice Provider and/or Housestaff. I personally reviewed laboratory data, imaging studies and relevant notes. I independently examined the patient and formulated the important aspects  of the plan. I have edited the note to reflect any of my changes or salient points. I have personally discussed the plan with the patient and/or family.  She is diuresing well but remains volume overloaded. Will continue IV lasix. Bicarb up. Will add diamox 250mg  bid. K is low. Will supp. Add spiro. Unable to arrange DC-CV for today due to endoscopy schedule. Will plan for tomorrow. Continue Eliquis.  Once foot wound heals will consider EP consult for CRT placement. Watch renal function closely.  PT consulted.   Glori Bickers, MD  10:21 PM

## 2017-12-04 NOTE — Progress Notes (Signed)
  Date: 12/04/2017  Patient name: [name]  Medical record number: [id number]  Date of birth: November 12, 1932   I have seen and evaluated this patient and I have discussed the plan of care with the house staff. Please see their note for complete details. I concur with their findings with the following additions/corrections:   Seems to be doing slightly better on exam today. Some changes were made to her medication regimen over the weekend with the help of the advanced heart failure team, including stopping the diltiazem, transitioning her atenolol to metoprolol, and increasing her Lasix to 3 times a day and adding metolazone. The plan is for cardioversion today, which hopefully will help her heart failure she is able to remain in sinus rhythm. Also important to note that her bundle branch block may be contributing to her heart failure symptoms, and she would likely benefit from cardiac recent conization therapy as indicated by Dr. Haroldine Laws, when her foot wound has healed.  Dr. Zenovia Jarred has also astutely noted that her bicarbonate is now up to 44 and her chloride is down to 81. As she will likely continue to require diuresis, she may benefit from the addition of acetazolamide, but we will discuss this with cardiology and heart failure first.  [name], MD 12/04/2017, 12:47 PM

## 2017-12-04 NOTE — Progress Notes (Signed)
PT Cancellation Note  Patient Details Name: NAKESHA EBRAHIM MRN: [id number] DOB: 1932-08-11   Cancelled Treatment:    Reason Eval/Treat Not Completed: Patient declined, no reason specified Patient non-verbally declined PT this morning as she was busy on a phone call. Will return later if time allows.    Deniece Ree PT, DPT, CBIS  Supplemental Physical Therapist [facility]   Pager [phone]

## 2017-12-04 NOTE — Progress Notes (Signed)
Patient is breathing better, less dyspnea, but still comfortable on 2L via nasal cannula. Patient still has pitting edema, especially present in right upper extremity. RN kept arms elevated throughout the night.

## 2017-12-05 ENCOUNTER — Encounter (HOSPITAL_COMMUNITY)
Admission: EM | Disposition: A | Discharge status: Skilled Nursing Facility | Payer: Self-pay | Source: Home / Self Care | Attending: Internal Medicine

## 2017-12-05 ENCOUNTER — Inpatient Hospital Stay (HOSPITAL_COMMUNITY): Payer: Medicare Other | Admitting: Certified Registered Nurse Anesthetist

## 2017-12-05 ENCOUNTER — Encounter (HOSPITAL_COMMUNITY): Payer: Self-pay | Admitting: Certified Registered"

## 2017-12-05 DIAGNOSIS — R0603 Acute respiratory distress: Secondary | ICD-10-CM

## 2017-12-05 DIAGNOSIS — E876 Hypokalemia: Secondary | ICD-10-CM

## 2017-12-05 HISTORY — PX: CARDIOVERSION: SHX1299

## 2017-12-05 LAB — BASIC METABOLIC PANEL
Anion gap: 14 (ref 5–15)
BUN: 38 mg/dL — ABNORMAL HIGH (ref 6–20)
CO2: 46 mmol/L — ABNORMAL HIGH (ref 22–32)
Calcium: 9 mg/dL (ref 8.9–10.3)
Chloride: 75 mmol/L — ABNORMAL LOW (ref 101–111)
Creatinine, Ser: 1.49 mg/dL — ABNORMAL HIGH (ref 0.44–1.00)
GFR calc Af Amer: 36 mL/min — ABNORMAL LOW (ref >60)
GFR calc non Af Amer: 31 mL/min — ABNORMAL LOW (ref >60)
Glucose, Bld: 108 mg/dL — ABNORMAL HIGH (ref 65–99)
Potassium: 3.1 mmol/L — ABNORMAL LOW (ref 3.5–5.1)
Sodium: 135 mmol/L (ref 135–145)

## 2017-12-05 LAB — MAGNESIUM: Magnesium: 1.9 mg/dL (ref 1.7–2.4)

## 2017-12-05 SURGERY — CARDIOVERSION
Anesthesia: General

## 2017-12-05 MED ORDER — POTASSIUM CHLORIDE CRYS ER 20 MEQ PO TBCR
40.0000 meq | EXTENDED_RELEASE_TABLET | Freq: Once | ORAL | Status: DC
  Administered 2017-12-05: 40 meq via ORAL

## 2017-12-05 MED ORDER — ACETAZOLAMIDE 250 MG PO TABS
500.0000 mg | ORAL_TABLET | Freq: Two times a day (BID) | ORAL | Status: AC
  Administered 2017-12-05 – 2017-12-07 (×5): 500 mg via ORAL
  Filled 2017-12-05 (×6): qty 2

## 2017-12-05 MED ORDER — LIDOCAINE 2% (20 MG/ML) 5 ML SYRINGE
INTRAMUSCULAR | Status: DC | PRN
  Administered 2017-12-05: 100 mg via INTRAVENOUS

## 2017-12-05 MED ORDER — SODIUM CHLORIDE 0.9 % IV SOLN
INTRAVENOUS | Status: DC
  Administered 2017-12-05: 07:00:00 via INTRAVENOUS

## 2017-12-05 MED ORDER — POTASSIUM CHLORIDE CRYS ER 20 MEQ PO TBCR
40.0000 meq | EXTENDED_RELEASE_TABLET | Freq: Once | ORAL | Status: AC
  Administered 2017-12-05: 40 meq via ORAL
  Filled 2017-12-05: qty 2

## 2017-12-05 MED ORDER — PROPOFOL 10 MG/ML IV BOLUS
INTRAVENOUS | Status: DC | PRN
  Administered 2017-12-05: 60 mg via INTRAVENOUS

## 2017-12-05 MED ORDER — FUROSEMIDE 10 MG/ML IJ SOLN
80.0000 mg | Freq: Two times a day (BID) | INTRAMUSCULAR | Status: AC
  Administered 2017-12-05 – 2017-12-06 (×2): 80 mg via INTRAVENOUS
  Filled 2017-12-05 (×2): qty 8

## 2017-12-05 MED ORDER — POTASSIUM CHLORIDE CRYS ER 20 MEQ PO TBCR
40.0000 meq | EXTENDED_RELEASE_TABLET | Freq: Two times a day (BID) | ORAL | Status: DC
  Administered 2017-12-05 – 2017-12-06 (×3): 40 meq via ORAL
  Filled 2017-12-05 (×3): qty 2

## 2017-12-05 MED ORDER — PHENYLEPHRINE HCL 10 MG/ML IJ SOLN
INTRAMUSCULAR | Status: DC | PRN
  Administered 2017-12-05: 120 ug via INTRAVENOUS

## 2017-12-05 NOTE — Progress Notes (Addendum)
  Date: 12/05/2017  Patient name: [name]  Medical record number: [id number]  Date of birth: 04-Aug-1932   I have seen and evaluated this patient and I have discussed the plan of care with the house staff. Please see their note for complete details. I concur with their findings with the following additions/corrections:   [name] was seen on rounds this morning and reported a successful cardioversion this morning. On review of her telemetry, she is indeed in sinus rhythm. Hopefully this will help her cardiac function. We will change her furosemide to twice a day today, continuing the metolazone, as she is still volume overloaded but significantly improved from yesterday. We have also added acetazolamide to assist with her significant bicarbonate elevation. We will need to watch her potassium carefully as we continue her diuresis, but potassium supplementation and spironolactone should help with this.  [name], MD 12/05/2017, 2:30 PM

## 2017-12-05 NOTE — Progress Notes (Signed)
Physical Therapy Treatment Patient Details Name: [name] MRN: [id number] DOB: 1932-07-11 Today's Date: 12/05/2017    History of Present Illness Pt is an 84 yo female with PMH of CAD, CKD 3, HLD, HTN, OSA, Chronic Afib, HFmrEF, and Hypothyroidism.  She presented  with 1 week of increasing shortness of breath, orthopnea. Pt admitted with dx of CHF.     PT Comments    Pt pleasant and agreeable to PT services.  Pt remains slow and guarded requiring min guard for safety.  Plan next session to progress mobility and continue education on safe hand placement during transfers.  Pt remains appropriate for return to SNF at this time.    Follow Up Recommendations  SNF     Equipment Recommendations  None recommended by PT    Recommendations for Other Services       Precautions / Restrictions Precautions Precautions: Fall Restrictions Weight Bearing Restrictions: No    Mobility  Bed Mobility Overal bed mobility: Needs Assistance Bed Mobility: Supine to Sit     Supine to sit: HOB elevated;Min guard     General bed mobility comments: VC for form and safety, min guard for safety   Transfers Overall transfer level: Needs assistance Equipment used: Rolling walker (2 wheeled) Transfers: Sit to/from Stand Sit to Stand: Min guard         General transfer comment: Pt performed transfer to and from RW cues for hand placement as she attempted to pull up on RW for support.  Cues for hand placement to reach back for seated surface and brinf RW back toward chair vs. leaving out to the side.    Ambulation/Gait Ambulation/Gait assistance: Min guard Ambulation Distance (Feet): 60 Feet Assistive device: Rolling walker (2 wheeled) Gait Pattern/deviations: Step-through pattern;Trunk flexed;Decreased stride length Gait velocity: decr   General Gait Details: Min guard for safety, cues for upper trunk control with standing rest break x3, unable to obtain SPO2 during ambulation.  Pt  presents with increased DOE.     Stairs            Wheelchair Mobility    Modified Rankin (Stroke Patients Only)       Balance Overall balance assessment: Needs assistance   Sitting balance-Leahy Scale: Good       Standing balance-Leahy Scale: Fair                              Cognition Arousal/Alertness: Awake/alert Behavior During Therapy: WFL for tasks assessed/performed Overall Cognitive Status: Within Functional Limits for tasks assessed                                        Exercises      General Comments        Pertinent Vitals/Pain Pain Assessment: No/denies pain    Home Living                      Prior Function            PT Goals (current goals can now be found in the care plan section) Acute Rehab PT Goals Patient Stated Goal: feel better Potential to Achieve Goals: Good Progress towards PT goals: Progressing toward goals    Frequency    Min 3X/week      PT Plan Current plan remains appropriate  Co-evaluation              AM-PAC PT "6 Clicks" Daily Activity  Outcome Measure  Difficulty turning over in bed (including adjusting bedclothes, sheets and blankets)?: Unable Difficulty moving from lying on back to sitting on the side of the bed? : Unable Difficulty sitting down on and standing up from a chair with arms (e.g., wheelchair, bedside commode, etc,.)?: Unable Help needed moving to and from a bed to chair (including a wheelchair)?: A Little Help needed walking in hospital room?: A Little Help needed climbing 3-5 steps with a railing? : A Lot 6 Click Score: 11    End of Session Equipment Utilized During Treatment: Gait belt;Oxygen Activity Tolerance: Patient limited by fatigue Patient left: in chair;with chair alarm set;with call bell/phone within reach;with nursing/sitter in room Nurse Communication: Mobility status PT Visit Diagnosis: Other abnormalities of gait and  mobility (R26.89);Muscle weakness (generalized) (M62.81)     Time: 8003-4917 PT Time Calculation (min) (ACUTE ONLY): 25 min  Charges:  $Gait Training: 8-22 mins $Therapeutic Activity: 8-22 mins                    G Codes:       Governor Rooks, PTA pager [phone]    Cristela Blue 12/05/2017, 4:08 PM

## 2017-12-05 NOTE — Progress Notes (Signed)
Dressing changed on right foot.

## 2017-12-05 NOTE — Anesthesia Preprocedure Evaluation (Addendum)
Anesthesia Evaluation  Patient identified by MRN, date of birth, ID band Patient awake    Reviewed: Allergy & Precautions, NPO status , Patient's Chart, lab work & pertinent test results  Airway Mallampati: II       Dental  (+) Teeth Intact, Chipped, Poor Dentition, Dental Advisory Given,    Pulmonary sleep apnea ,    breath sounds clear to auscultation       Cardiovascular hypertension, + CAD and +CHF  + dysrhythmias  Rhythm:Irregular Rate:Abnormal     Neuro/Psych  Headaches, PSYCHIATRIC DISORDERS Anxiety Depression    GI/Hepatic Neg liver ROS, hiatal hernia, GERD  ,  Endo/Other  Hypothyroidism   Renal/GU Renal disease  negative genitourinary   Musculoskeletal  (+) Arthritis ,   Abdominal   Peds  Hematology   Anesthesia Other Findings   Reproductive/Obstetrics                            Echo: - Left ventricle: The cavity size was normal. Wall thickness was   increased in a pattern of mild LVH. Systolic function was mildly   to moderately reduced. The estimated ejection fraction was in the   range of 40% to 45%. Wall motion was normal; there were no   regional wall motion abnormalities. - Ventricular septum: Septal motion showed abnormal function and   dyssynergy. These changes are consistent with a left bundle   branch block. - Aortic valve: There was mild regurgitation. - Mitral valve: Mildly calcified annulus. Mildly calcified leaflets   . There was moderate to severe regurgitation. - Left atrium: The atrium was mildly to moderately dilated. - Right atrium: The atrium was mildly dilated. - Tricuspid valve: There was moderate regurgitation. - Pulmonary arteries: Systolic pressure was moderately increased.   PA peak pressure: 60 mm Hg (S).  Anesthesia Physical Anesthesia Plan  ASA: III  Anesthesia Plan: General   Post-op Pain Management:    Induction: Intravenous  PONV Risk  Score and Plan: Propofol infusion  Airway Management Planned: Natural Airway and Mask  Additional Equipment: None  Intra-op Plan:   Post-operative Plan:   Informed Consent: I have reviewed the patients History and Physical, chart, labs and discussed the procedure including the risks, benefits and alternatives for the proposed anesthesia with the patient or authorized representative who has indicated his/her understanding and acceptance.   Dental advisory given  Plan Discussed with: CRNA  Anesthesia Plan Comments:         Anesthesia Quick Evaluation

## 2017-12-05 NOTE — Progress Notes (Signed)
PT Cancellation Note  Patient Details Name: [name] MRN: [id number] DOB: 31-Jul-1932   Cancelled Treatment:    Reason Eval/Treat Not Completed: (P) Fatigue/lethargy limiting ability to participate;Patient declined, no reason specified(Pt reports feeling fatigued from procedure this am, will f/u as time permits per POC.  )   Vermon Grays Eli Hose 12/05/2017, 9:42 AM  Governor Rooks, PTA pager [phone]

## 2017-12-05 NOTE — Interval H&P Note (Signed)
History and Physical Interval Note:  12/05/2017 7:50 AM  [name]  has presented today for surgery, with the diagnosis of a fib  The various methods of treatment have been discussed with the patient and family. After consideration of risks, benefits and other options for treatment, the patient has consented to  Procedure(s): CARDIOVERSION (N/A) as a surgical intervention .  The patient's history has been reviewed, patient examined, no change in status, stable for surgery.  I have reviewed the patient's chart and labs.  Questions were answered to the patient's satisfaction.     Daniel Bensimhon

## 2017-12-05 NOTE — Progress Notes (Addendum)
Advanced Heart Failure Rounding Note  Primary Cardiologist: Wynonia Lawman  Subjective:    Continues to diurese. Weight down another 10 pounds (24 pounds total) Breathing better. Feels swelling is improving. Mild dyspnea. No CP. No palpitations. Remains in AF  Objective:   Weight Range: 81.9 kg (180 lb 9.6 oz) Body mass index is 31 kg/m.   Vital Signs:   Temp:  [97.7 F (36.5 C)-98.2 F (36.8 C)] 97.7 F (36.5 C) (01/08 0705) Pulse Rate:  [56-95] 76 (01/08 0705) Resp:  [18] 18 (01/08 0705) BP: (91-124)/(40-57) 91/40 (01/08 0705) SpO2:  [98 %-100 %] 100 % (01/08 0705) Weight:  [81.9 kg (180 lb 9.6 oz)] 81.9 kg (180 lb 9.6 oz) (01/08 0523) Last BM Date: 12/04/17  Weight change: Filed Weights   12/03/17 0509 12/04/17 0457 12/05/17 0523  Weight: 89.9 kg (198 lb 3.2 oz) 86.4 kg (190 lb 6.4 oz) 81.9 kg (180 lb 9.6 oz)    Intake/Output:   Intake/Output Summary (Last 24 hours) at 12/05/2017 0740 Last data filed at 12/05/2017 0650 Gross per 24 hour  Intake 343 ml  Output 4650 ml  Net -4307 ml      Physical Exam    General:  Elderly. Lying in bed No resp difficulty HEENT: normal Neck: supple. JVP 8-9. Carotids 2+ bilat; no bruits. No lymphadenopathy or thryomegaly appreciated. Cor: PMI nondisplaced. Irregular rate & rhythm. No rubs, gallops or murmurs. Lungs: clear decreased at bases  Abdomen: soft, nontender, nondistended. No hepatosplenomegaly. No bruits or masses. Good bowel sounds. Extremities: no cyanosis, clubbing, rash, 1+ edema in upper and lower extremities  Dorsum R foot with wound Neuro: alert & orientedx3, cranial nerves grossly intact. moves all 4 extremities w/o difficulty. Affect pleasant    Telemetry   Afib 60s + LBBB, personally reviewed  EKG    No new tracings.   Labs    CBC No results for input(s): WBC, NEUTROABS, HGB, HCT, MCV, PLT in the last 72 hours. Basic Metabolic Panel Recent Labs    12/04/17 0511 12/05/17 0611  NA 136 135  K 3.7  3.1*  CL 81* 75*  CO2 44* 46*  GLUCOSE 97 108*  BUN 37* 38*  CREATININE 1.41* 1.49*  CALCIUM 8.9 9.0  MG 2.2  --    Liver Function Tests No results for input(s): AST, ALT, ALKPHOS, BILITOT, PROT, ALBUMIN in the last 72 hours. No results for input(s): LIPASE, AMYLASE in the last 72 hours. Cardiac Enzymes No results for input(s): CKTOTAL, CKMB, CKMBINDEX, TROPONINI in the last 72 hours.  BNP: BNP (last 3 results) Recent Labs    06/17/17 1644 11/24/17 1155  BNP 652.8* 550.6*    ProBNP (last 3 results) No results for input(s): PROBNP in the last 8760 hours.   D-Dimer No results for input(s): DDIMER in the last 72 hours. Hemoglobin A1C No results for input(s): HGBA1C in the last 72 hours. Fasting Lipid Panel No results for input(s): CHOL, HDL, LDLCALC, TRIG, CHOLHDL, LDLDIRECT in the last 72 hours. Thyroid Function Tests No results for input(s): TSH, T4TOTAL, T3FREE, THYROIDAB in the last 72 hours.  Invalid input(s): FREET3  Other results:   Imaging    No results found.   Medications:     Scheduled Medications: . [MAR Hold] acetaZOLAMIDE  250 mg Oral BID  . [MAR Hold] amiodarone  200 mg Oral Daily  . [MAR Hold] apixaban  2.5 mg Oral BID  . [MAR Hold] atorvastatin  60 mg Oral q1800  . [MAR Hold] furosemide  80 mg Intravenous TID  . [MAR Hold] levothyroxine  200 mcg Oral QAC breakfast  . [MAR Hold] metolazone  2.5 mg Oral Daily  . [MAR Hold] metoprolol succinate  50 mg Oral Daily  . [MAR Hold] sodium chloride flush  3 mL Intravenous Q12H  . [MAR Hold] sodium chloride flush  3 mL Intravenous Q12H  . [MAR Hold] sodium chloride flush  3 mL Intravenous Q12H  . [MAR Hold] spironolactone  12.5 mg Oral Daily    Infusions: . sodium chloride    . sodium chloride    . sodium chloride    . sodium chloride 20 mL/hr at 12/05/17 0716    PRN Medications: [MAR Hold] acetaminophen **OR** [MAR Hold] acetaminophen, [MAR Hold] senna, [MAR Hold] sodium chloride flush,  [MAR Hold] sodium chloride flush, [MAR Hold] sodium chloride flush    Patient Profile   84 y/o woman with h/o CAD s/p LAD stent 2003, HTN, HL, LBBB and persistent AF (since 4/18) admitted with recurrent HF and EF drop to 35-40%     Assessment/Plan   1. Acute on chronic systolic HF with biventricualr failure - Echo personally reviewed. Appears to have EF 35-40% with moderate RV dysfunction and severe MR with dyssynchrony due to LBBB. (EF 45-50% in 7/18)  - suspect EF down due to AF and dyssynchrony from LBBB. ? Component of myxedema. - Volume status much improved. Developing contraction alkalosis.  - Decrease lasix to 80 IV bid. Continue metolazone for one more day  - Increase diamox to 500 bid  - Continue spiro & toprol - Will try to add ACE/ARB after DC-CV if renal function stable  - Suspect she will do better in NSR +/- CRT.  - Likely has enough amio on board to support successful DC-CV. Will do this am . Although TSH high. T4 is only mildly reduced and likely safe for anesthesia.  - Can consider CRT once foot wound heals (not candidate for implanted device with open wound)  2. Persistent AF - DC-CV today  - Continue low-dose amio for now - Continue Eliquis. If creatinine remains below 1.5 will need to increase to 5 mg BID.    3. LBBB - See discussion above. Suspect dyssynchrony playing a role in LV dysfunction. Consider CRT (Biv pacer once foot wound heals)  4. Severe mitral regurgitation - Secondary to LV dilation. Not candidate for MV clip. Suspect it will improve with improvement in HF. No change.   5. CAD s/p LAD stent 2003 - No s/s ischemia Cath 2012 with patent stent and otherwise minimal CAD - continue statin. Off ASA with Eliquis  6. Foot wound - Appreciate WOC support.   7. CKD III - Stable in 1.3 - 1.5 range. As above, may need to increase Eliquis. Though, has recently been as high as 1.8.  8. Hypothyroidism - Suspect due to amio. Remains on low dose  amio. May need to stop.  - Continue synthroid.  - Although TSH high. T4 is only mildly reduced and likely safe for anesthesia. No change.   9. Hypokalemia - Will supp. Spiro added   10. RUE swelling - R>L edema. Keep elevated. Get u/s  Length of Stay: 11  Glori Bickers, MD  12/05/2017, 7:40 AM  Advanced Heart Failure Team Pager [phone] (M-F; 7a - 4p)  Please contact [facility] for night-coverage after hours (4p -7a ) and weekends on [email or web address]

## 2017-12-05 NOTE — Anesthesia Postprocedure Evaluation (Signed)
Anesthesia Post Note  Patient: [name]  Procedure(s) Performed: CARDIOVERSION (N/A )     Patient location during evaluation: PACU Anesthesia Type: General Level of consciousness: awake and alert Pain management: pain level controlled Vital Signs Assessment: post-procedure vital signs reviewed and stable Respiratory status: spontaneous breathing, nonlabored ventilation, respiratory function stable and patient connected to nasal cannula oxygen Cardiovascular status: blood pressure returned to baseline and stable Postop Assessment: no apparent nausea or vomiting Anesthetic complications: no    Last Vitals:  Vitals:   12/05/17 0800 12/05/17 0811  BP: (!) 91/40 (!) 107/33  Pulse:  (!) 48  Resp:  18  Temp:    SpO2:  100%    Last Pain:  Vitals:   12/05/17 0705  TempSrc: Oral  PainSc: 6                  Sophia Cubero DAVID

## 2017-12-05 NOTE — Progress Notes (Signed)
   Subjective: [name] was seen doing well this morning after her cardioversion. The patient denied any sob, chest pain, sensation of heart racing, or changes in the pain of her right foot. The patient's telemetry was noted to be in normal sinus rhythm.   Objective:  Vital signs in last 24 hours: Vitals:   12/05/17 0800 12/05/17 0811 12/05/17 0812 12/05/17 0822  BP: (!) 91/40 (!) 107/33 (!) 109/36 (!) 116/31  Pulse:  (!) 48 (!) 50 (!) 49  Resp:  18 19 16   Temp:      TempSrc:      SpO2:  100% 99% 100%  Weight:      Height:       Physical Exam  Constitutional: She appears well-developed and well-nourished. No distress.  HENT:  Head: Normocephalic and atraumatic.  Eyes: Conjunctivae are normal.  Cardiovascular: Normal rate, regular rhythm, normal heart sounds and intact distal pulses.  Respiratory: Breath sounds normal. She is in respiratory distress. She has no wheezes.  GI: Soft. Bowel sounds are normal. She exhibits no distension. There is no tenderness.  Musculoskeletal: Normal range of motion.  Neurological: She is alert.  Skin: She is not diaphoretic. There is erythema (dorsal aspect of right foot).  Psychiatric: She has a normal mood and affect. Her behavior is normal. Judgment and thought content normal.   Assessment/Plan:  Acute on chronic HFrEF: The patient is continuing to diurese well with -4.3L and weight today is 180lbs. The patient's  Bicarb=46, cr=1.49 in contraction alkalosis. Started acetazolamide 250mg  bid to manage and help diuresis.  -Appreciate cardiology recs -Spironolactone 12.5mg  qd -Continue metoprolol XL 50mg  qd -Continuelasix 80mg  decreased from TID to BID -Continue metolazone 2.5mg  qd -TED hose -Heart healthy with fluid restriction to 1217mL -Need to wean oxygen-currently on 2L via [place] -Patient was counseled on the importance of fluid restriction and limiting foods high in salt content  Left bundle branch block  -will consider CRT once the  patient's foot wound heals   Hypokalemia K=3.1 today -checked mg level -repleted with kdur 40mg  po bid -Recheck bmp tomorrow 12/06/17  Right Dorsal Foot Ulceration: The patient's footwas rewrapped this morning. Will monitor for resolution.   CKD III  -followdailyBMP-the patient's cr=1.49 today 12/05/17  Chronic Atrial Fibrillation The patient underwent cardioversion today and converted to normal sinus rhythm.   -continue amiodarone 200mg  qd -Continue eliquis 2.5mg  bid -Continue toprol 50mg  qd  -Diltiazem discontinued  Hypothyroidism  -Continue levothyroxine 259mcg. -Oupatient endocrinology follow upnecessaryfor dosage adjustment with concomitant amiodarone use.  Dispo: Anticipated discharge in approximately 2-3 day(s).   Lars Mage, MD Internal Medicine PGY1 Pager:[phone] 12/05/2017, 8:42 AM

## 2017-12-05 NOTE — Plan of Care (Signed)
  Clinical Measurements: Will remain free from infection 12/05/2017 2340 - Progressing by Tristan Schroeder, RN   Clinical Measurements: Respiratory complications will improve 12/05/2017 2340 - Progressing by Tristan Schroeder, RN

## 2017-12-05 NOTE — Progress Notes (Signed)
Appreciate Dr. Clayborne Dana help.  Currently maintaining sinus rhythm and sinus bradycardia. Weight finally coming off.  Kerry Hough MD [facility]

## 2017-12-05 NOTE — Transfer of Care (Signed)
Immediate Anesthesia Transfer of Care Note  Patient: [name]  Procedure(s) Performed: CARDIOVERSION (N/A )  Patient Location: Endoscopy Unit  Anesthesia Type:General  Level of Consciousness: awake and oriented  Airway & Oxygen Therapy: Patient Spontanous Breathing and Patient connected to nasal cannula oxygen  Post-op Assessment: Report given to RN  Post vital signs: Reviewed and stable  Last Vitals:  Vitals:   12/05/17 0705 12/05/17 0800  BP: (!) 91/40 (!) 91/40  Pulse: 76   Resp: 18   Temp: 36.5 C   SpO2: 100%     Last Pain:  Vitals:   12/05/17 0705  TempSrc: Oral  PainSc: 6       Patients Stated Pain Goal: 0 (01/26/30 4388)  Complications: No apparent anesthesia complications

## 2017-12-05 NOTE — CV Procedure (Signed)
   DIRECT CURRENT CARDIOVERSION  NAME:  [name]   MRN: [id number] DOB:  1932/01/08   ADMIT DATE: 11/24/2017   INDICATIONS: Atrial fibrillation    PROCEDURE:   Informed consent was obtained prior to the procedure. The risks, benefits and alternatives for the procedure were discussed and the patient comprehended these risks. Once an appropriate time out was taken, the patient had the defibrillator pads placed in the anterior and posterior position. The patient then underwent sedation by the anesthesia service. Once an appropriate level of sedation was achieved, the patient received a single biphasic, synchronized 200J shock with prompt conversion to sinus rhythm. No apparent complications.  Glori Bickers, MD  7:52 AM

## 2017-12-05 NOTE — Progress Notes (Signed)
Physical therapy ambulated patient then assisted patient to chair. Please see therapy note.

## 2017-12-05 NOTE — H&P (View-Only) (Signed)
Advanced Heart Failure Rounding Note  Primary Cardiologist: Wynonia Lawman  Subjective:    Continues to diurese. Weight down another 10 pounds (24 pounds total) Breathing better. Feels swelling is improving. Mild dyspnea. No CP. No palpitations. Remains in AF  Objective:   Weight Range: 81.9 kg (180 lb 9.6 oz) Body mass index is 31 kg/m.   Vital Signs:   Temp:  [97.7 F (36.5 C)-98.2 F (36.8 C)] 97.7 F (36.5 C) (01/08 0705) Pulse Rate:  [56-95] 76 (01/08 0705) Resp:  [18] 18 (01/08 0705) BP: (91-124)/(40-57) 91/40 (01/08 0705) SpO2:  [98 %-100 %] 100 % (01/08 0705) Weight:  [81.9 kg (180 lb 9.6 oz)] 81.9 kg (180 lb 9.6 oz) (01/08 0523) Last BM Date: 12/04/17  Weight change: Filed Weights   12/03/17 0509 12/04/17 0457 12/05/17 0523  Weight: 89.9 kg (198 lb 3.2 oz) 86.4 kg (190 lb 6.4 oz) 81.9 kg (180 lb 9.6 oz)    Intake/Output:   Intake/Output Summary (Last 24 hours) at 12/05/2017 0740 Last data filed at 12/05/2017 0650 Gross per 24 hour  Intake 343 ml  Output 4650 ml  Net -4307 ml      Physical Exam    General:  Elderly. Lying in bed No resp difficulty HEENT: normal Neck: supple. JVP 8-9. Carotids 2+ bilat; no bruits. No lymphadenopathy or thryomegaly appreciated. Cor: PMI nondisplaced. Irregular rate & rhythm. No rubs, gallops or murmurs. Lungs: clear decreased at bases  Abdomen: soft, nontender, nondistended. No hepatosplenomegaly. No bruits or masses. Good bowel sounds. Extremities: no cyanosis, clubbing, rash, 1+ edema in upper and lower extremities  Dorsum R foot with wound Neuro: alert & orientedx3, cranial nerves grossly intact. moves all 4 extremities w/o difficulty. Affect pleasant    Telemetry   Afib 60s + LBBB, personally reviewed  EKG    No new tracings.   Labs    CBC No results for input(s): WBC, NEUTROABS, HGB, HCT, MCV, PLT in the last 72 hours. Basic Metabolic Panel Recent Labs    12/04/17 0511 12/05/17 0611  NA 136 135  K 3.7  3.1*  CL 81* 75*  CO2 44* 46*  GLUCOSE 97 108*  BUN 37* 38*  CREATININE 1.41* 1.49*  CALCIUM 8.9 9.0  MG 2.2  --    Liver Function Tests No results for input(s): AST, ALT, ALKPHOS, BILITOT, PROT, ALBUMIN in the last 72 hours. No results for input(s): LIPASE, AMYLASE in the last 72 hours. Cardiac Enzymes No results for input(s): CKTOTAL, CKMB, CKMBINDEX, TROPONINI in the last 72 hours.  BNP: BNP (last 3 results) Recent Labs    06/17/17 1644 11/24/17 1155  BNP 652.8* 550.6*    ProBNP (last 3 results) No results for input(s): PROBNP in the last 8760 hours.   D-Dimer No results for input(s): DDIMER in the last 72 hours. Hemoglobin A1C No results for input(s): HGBA1C in the last 72 hours. Fasting Lipid Panel No results for input(s): CHOL, HDL, LDLCALC, TRIG, CHOLHDL, LDLDIRECT in the last 72 hours. Thyroid Function Tests No results for input(s): TSH, T4TOTAL, T3FREE, THYROIDAB in the last 72 hours.  Invalid input(s): FREET3  Other results:   Imaging    No results found.   Medications:     Scheduled Medications: . [MAR Hold] acetaZOLAMIDE  250 mg Oral BID  . [MAR Hold] amiodarone  200 mg Oral Daily  . [MAR Hold] apixaban  2.5 mg Oral BID  . [MAR Hold] atorvastatin  60 mg Oral q1800  . [MAR Hold] furosemide  80 mg Intravenous TID  . [MAR Hold] levothyroxine  200 mcg Oral QAC breakfast  . [MAR Hold] metolazone  2.5 mg Oral Daily  . [MAR Hold] metoprolol succinate  50 mg Oral Daily  . [MAR Hold] sodium chloride flush  3 mL Intravenous Q12H  . [MAR Hold] sodium chloride flush  3 mL Intravenous Q12H  . [MAR Hold] sodium chloride flush  3 mL Intravenous Q12H  . [MAR Hold] spironolactone  12.5 mg Oral Daily    Infusions: . sodium chloride    . sodium chloride    . sodium chloride    . sodium chloride 20 mL/hr at 12/05/17 0716    PRN Medications: [MAR Hold] acetaminophen **OR** [MAR Hold] acetaminophen, [MAR Hold] senna, [MAR Hold] sodium chloride flush,  [MAR Hold] sodium chloride flush, [MAR Hold] sodium chloride flush    Patient Profile   84 y/o woman with h/o CAD s/p LAD stent 2003, HTN, HL, LBBB and persistent AF (since 4/18) admitted with recurrent HF and EF drop to 35-40%     Assessment/Plan   1. Acute on chronic systolic HF with biventricualr failure - Echo personally reviewed. Appears to have EF 35-40% with moderate RV dysfunction and severe MR with dyssynchrony due to LBBB. (EF 45-50% in 7/18)  - suspect EF down due to AF and dyssynchrony from LBBB. ? Component of myxedema. - Volume status much improved. Developing contraction alkalosis.  - Decrease lasix to 80 IV bid. Continue metolazone for one more day  - Increase diamox to 500 bid  - Continue spiro & toprol - Will try to add ACE/ARB after DC-CV if renal function stable  - Suspect she will do better in NSR +/- CRT.  - Likely has enough amio on board to support successful DC-CV. Will do this am . Although TSH high. T4 is only mildly reduced and likely safe for anesthesia.  - Can consider CRT once foot wound heals (not candidate for implanted device with open wound)  2. Persistent AF - DC-CV today  - Continue low-dose amio for now - Continue Eliquis. If creatinine remains below 1.5 will need to increase to 5 mg BID.    3. LBBB - See discussion above. Suspect dyssynchrony playing a role in LV dysfunction. Consider CRT (Biv pacer once foot wound heals)  4. Severe mitral regurgitation - Secondary to LV dilation. Not candidate for MV clip. Suspect it will improve with improvement in HF. No change.   5. CAD s/p LAD stent 2003 - No s/s ischemia Cath 2012 with patent stent and otherwise minimal CAD - continue statin. Off ASA with Eliquis  6. Foot wound - Appreciate WOC support.   7. CKD III - Stable in 1.3 - 1.5 range. As above, may need to increase Eliquis. Though, has recently been as high as 1.8.  8. Hypothyroidism - Suspect due to amio. Remains on low dose  amio. May need to stop.  - Continue synthroid.  - Although TSH high. T4 is only mildly reduced and likely safe for anesthesia. No change.   9. Hypokalemia - Will supp. Arlyce Harman added    Length of Stay: 11  Glori Bickers, MD  12/05/2017, 7:40 AM  Advanced Heart Failure Team Pager [phone] (M-F; 7a - 4p)  Please contact [facility] for night-coverage after hours (4p -7a ) and weekends on [email or web address]

## 2017-12-06 ENCOUNTER — Inpatient Hospital Stay (HOSPITAL_COMMUNITY): Payer: Medicare Other

## 2017-12-06 DIAGNOSIS — R001 Bradycardia, unspecified: Secondary | ICD-10-CM

## 2017-12-06 DIAGNOSIS — R5383 Other fatigue: Secondary | ICD-10-CM

## 2017-12-06 DIAGNOSIS — R609 Edema, unspecified: Secondary | ICD-10-CM

## 2017-12-06 LAB — BASIC METABOLIC PANEL
Anion gap: 14 (ref 5–15)
BUN: 48 mg/dL — AB (ref 6–20)
CO2: 39 mmol/L — ABNORMAL HIGH (ref 22–32)
CREATININE: 1.65 mg/dL — AB (ref 0.44–1.00)
Calcium: 9.1 mg/dL (ref 8.9–10.3)
Chloride: 80 mmol/L — ABNORMAL LOW (ref 101–111)
GFR calc Af Amer: 32 mL/min — ABNORMAL LOW (ref >60)
GFR, EST NON AFRICAN AMERICAN: 28 mL/min — AB (ref >60)
Glucose, Bld: 115 mg/dL — ABNORMAL HIGH (ref 65–99)
POTASSIUM: 4.3 mmol/L (ref 3.5–5.1)
SODIUM: 133 mmol/L — AB (ref 135–145)

## 2017-12-06 NOTE — Progress Notes (Signed)
Advanced Heart Failure Rounding Note  Primary Cardiologist: Wynonia Lawman  Subjective:    Underwent successful DCCV 12/05/17  Tired this morning. Feels like its taking longer to "bounce back" from DCCV this time. Denies SOB. Swelling continues to improve.  R>L edema persists. R arm slightly tender.  Creatinine 1.49 -> 1.65. Near perceived baseline. K stable.  Negative 600 cc and down 2 more lbs. (26 total)  Objective:   Weight Range: 178 lb 14.4 oz (81.1 kg) Body mass index is 30.71 kg/m.   Vital Signs:   Temp:  [97.5 F (36.4 C)-97.8 F (36.6 C)] 97.8 F (36.6 C) (01/09 0520) Pulse Rate:  [48-68] 68 (01/09 0520) Resp:  [15-19] 18 (01/09 0520) BP: (91-118)/(31-54) 98/54 (01/09 0520) SpO2:  [98 %-100 %] 100 % (01/09 0520) Weight:  [178 lb 14.4 oz (81.1 kg)] 178 lb 14.4 oz (81.1 kg) (01/09 0221) Last BM Date: 12/04/17  Weight change: Filed Weights   12/04/17 0457 12/05/17 0523 12/06/17 0221  Weight: 190 lb 6.4 oz (86.4 kg) 180 lb 9.6 oz (81.9 kg) 178 lb 14.4 oz (81.1 kg)    Intake/Output:   Intake/Output Summary (Last 24 hours) at 12/06/2017 0730 Last data filed at 12/06/2017 0558 Gross per 24 hour  Intake 1000 ml  Output 1600 ml  Net -600 ml      Physical Exam    General: NAD  HEENT: Normal Neck: Supple. JVP ~9-10 cm Carotids.  2+ bilat; no bruits. No thyromegaly or nodule noted. Cor: PMI nondisplaced. Regular, slightly brady. No M/G/R noted Lungs: CTAB, normal effort. Abdomen: Soft, non-tender, non-distended, no HSM. No bruits or masses. +BS  Extremities: No cyanosis, clubbing, or rash. 1+ BLE edema. R>L UE edema. 1-2+ RUE edema. Dorsum R foot with wound Neuro: Alert & orientedx3, cranial nerves grossly intact. moves all 4 extremities w/o difficulty. Affect pleasant   Telemetry   NSR/Sinus Loletha Grayer 50-60s since DCCV yesterday, personally reviewed.   EKG    No new tracings.   Labs    CBC No results for input(s): WBC, NEUTROABS, HGB, HCT, MCV, PLT in the  last 72 hours. Basic Metabolic Panel Recent Labs    12/04/17 0511 12/05/17 0611 12/06/17 0409  NA 136 135 133*  K 3.7 3.1* 4.3  CL 81* 75* 80*  CO2 44* 46* 39*  GLUCOSE 97 108* 115*  BUN 37* 38* 48*  CREATININE 1.41* 1.49* 1.65*  CALCIUM 8.9 9.0 9.1  MG 2.2 1.9  --    Liver Function Tests No results for input(s): AST, ALT, ALKPHOS, BILITOT, PROT, ALBUMIN in the last 72 hours. No results for input(s): LIPASE, AMYLASE in the last 72 hours. Cardiac Enzymes No results for input(s): CKTOTAL, CKMB, CKMBINDEX, TROPONINI in the last 72 hours.  BNP: BNP (last 3 results) Recent Labs    06/17/17 1644 11/24/17 1155  BNP 652.8* 550.6*    ProBNP (last 3 results) No results for input(s): PROBNP in the last 8760 hours.   D-Dimer No results for input(s): DDIMER in the last 72 hours. Hemoglobin A1C No results for input(s): HGBA1C in the last 72 hours. Fasting Lipid Panel No results for input(s): CHOL, HDL, LDLCALC, TRIG, CHOLHDL, LDLDIRECT in the last 72 hours. Thyroid Function Tests No results for input(s): TSH, T4TOTAL, T3FREE, THYROIDAB in the last 72 hours.  Invalid input(s): FREET3  Other results:   Imaging    No results found.   Medications:     Scheduled Medications: . acetaZOLAMIDE  500 mg Oral BID  . amiodarone  200 mg Oral Daily  . apixaban  2.5 mg Oral BID  . atorvastatin  60 mg Oral q1800  . furosemide  80 mg Intravenous BID  . levothyroxine  200 mcg Oral QAC breakfast  . metoprolol succinate  50 mg Oral Daily  . potassium chloride  40 mEq Oral BID  . sodium chloride flush  3 mL Intravenous Q12H  . sodium chloride flush  3 mL Intravenous Q12H  . sodium chloride flush  3 mL Intravenous Q12H  . spironolactone  12.5 mg Oral Daily    Infusions: . sodium chloride    . sodium chloride    . sodium chloride      PRN Medications: acetaminophen **OR** acetaminophen, senna, sodium chloride flush, sodium chloride flush, sodium chloride  flush    Patient Profile   84 y/o woman with h/o CAD s/p LAD stent 2003, HTN, HL, LBBB and persistent AF (since 4/18) admitted with recurrent HF and EF drop to 35-40%     Assessment/Plan   1. Acute on chronic systolic HF with biventricualr failure - Echo personally reviewed. Appears to have EF 35-40% with moderate RV dysfunction and severe MR with dyssynchrony due to LBBB. (EF 45-50% in 7/18)  - suspect EF down due to AF and dyssynchrony from LBBB. ? Component of myxedema. - Volume status much improved. Developing contraction alkalosis.  - Continue lasix 80 mg IV BID today. No metolazone with soft pressure and mild bump in creatinine.  - Continue diamox 500 bid with contraction alkalosis.  - Continue spiro 12.5 mg daily - Continue toprol 50 mg daily. Watch HR closely. - Hold off on ACE/ARB today with soft pressure and mild creatinine bump.  - Suspect she will do better in NSR +/- CRT.  - Can consider CRT once foot wound heals (not candidate for implanted device with open wound)  2. Persistent AF - Now in NSR/sinus brady s/p DCCV 12/05/17 - Continue amiodarone 200 mg daily for now.  - Continue Eliquis. Creatinine 1.6 this am. If consistently below 1.5 will need to increase to 5 mg BID.    3. LBBB - See discussion above. Suspect dyssynchrony playing a role in LV dysfunction. Consider CRT once foot wound heals.   4. Severe mitral regurgitation - Secondary to LV dilation. Not candidate for MV clip. Suspect it will improve with improvement in HF. No change.   5. CAD s/p LAD stent 2003 - No s/s of ischemia. Cath 2012 with patent stent and otherwise minimal CAD - continue statin. Off ASA with Eliquis  6. Foot wound - Appreciate WOC support. No change.   7. CKD III - Baseline 1.3 - 1.5 range. - 1.6 today. Follow closely for appropriate dosing of Eliquis.   8. Hypothyroidism - Suspect due to amio. Remains on low dose amio. May need to stop.  - Continue synthroid.  -  Although TSH high. T4 is only mildly reduced and likely safe for anesthesia. No change for now.   9. Hypokalemia - Stable today with addition of spiro.   10. RUE swelling - R>L edema. Keep elevated. Will get [place].   Remains slightly volume overloaded but continues to improve. Remains in NSR s/p DCCV. Check [place] for RUE swelling. No room to uptitrate meds with soft pressures in 90s this am.  Continue to diurese at least today. Follow renal function closely.   Length of Stay: [address]  Annamaria Helling  12/06/2017, 7:30 AM  Advanced Heart Failure Team Pager [phone] (M-F; 7a -  4p)  Please contact [facility] for night-coverage after hours (4p -7a ) and weekends on [email or web address]  Patient seen and examined with the above-signed Advanced Practice Provider and/or Housestaff. I personally reviewed laboratory data, imaging studies and relevant notes. I independently examined the patient and formulated the important aspects of the plan. I have edited the note to reflect any of my changes or salient points. I have personally discussed the plan with the patient and/or family.  She is maintaining sinus rhythm post DC-CV. Feels a bit better. Volume status still elevated. Continue IV diuresis. RUE u/s viewed personally. No DVT. PT recommending SNF which I agree with completely once we have her full diuresed.   Glori Bickers, MD  2:45 PM

## 2017-12-06 NOTE — Progress Notes (Signed)
*  PRELIMINARY RESULTS* Vascular Ultrasound Right upper extremity venous duplex has been completed.  Preliminary findings: The visualized veins of the right upper extremity appear negative for deep and superficial veins thrombosis.  Subcutaneous fluid noted in the right forearm.  [name] 12/06/2017, 11:22 AM

## 2017-12-06 NOTE — Progress Notes (Addendum)
   Subjective: [name] appeared slow to respond this morning and weak. She states that she was woken up several times over night and has not had enough sleep.   Objective:  Vital signs in last 24 hours: Vitals:   12/05/17 1241 12/05/17 2146 12/06/17 0221 12/06/17 0520  BP: (!) 118/49 (!) 104/47  (!) 98/54  Pulse: (!) 59 61  68  Resp:  15  18  Temp:  (!) 97.5 F (36.4 C)  97.8 F (36.6 C)  TempSrc:  Oral  Oral  SpO2: 100% 98%  100%  Weight:   178 lb 14.4 oz (81.1 kg)   Height:       Physical Exam  Constitutional: She appears well-developed and well-nourished. She appears lethargic. No distress.  HENT:  Head: Normocephalic and atraumatic.  Eyes: Conjunctivae are normal.  Neck: JVD present.  Cardiovascular: Regular rhythm and intact distal pulses. Bradycardia present.  Respiratory: Effort normal. No respiratory distress. She has no wheezes. She has rales.  GI: Soft. Bowel sounds are normal. She exhibits no distension. There is no tenderness.  Musculoskeletal: She exhibits no edema (right upper extremity more swollen in comparison to left ).  Neurological: She appears lethargic.  Skin: No rash noted. She is not diaphoretic.  Psychiatric: She has a normal mood and affect. Her behavior is normal. Judgment and thought content normal.    Assessment/Plan:  [name] is admitted with heart failure exacerbation thought to be due to atrial fibrillation and uncontrolled hypothyroidism.   Acute on chronic HFrEF: The patient diuresed -660mL over the past 24 hrs, her cr=1.65 and bicarb=39. The patient's weight today is 178lbs.   -The patient is currently on acetazolamide 500mg  bid, considering decreasing dose to 250mg  bid- appreciate cardiology recs -Continue lasix 80mg  bid  -Continue spironolactone 12.5mg   -Discontinued metolazone  -Continue TED hose  -Heart healthy with fluid restriction to 1231mL -Appreciate cardiology recs -The patient has continued to be saturating between  98-100%, weaned off oxygen today -right upper extremity sent for vascular ultrasound which was negative for any deep or superficial vein thrombosis  Left bundle branch block  -will consider CRT once the patient's foot wound heals   Hypokalemia K=4.3 today 12/06/17 -Mg=1.9 12/05/17 -repleted with kdur 40mg  po bid  Right Dorsal Foot Ulceration: The patient's footwas rewrapped this morning. Will monitor for resolution.   CKD III -followdailyBMP-the patient's increased to cr=1.65 today from 1.49 previous  Chronic Atrial Fibrillation Cardioverted on 12/05/17. Per telemetry the patient continues to be in normal sinus rhythm.   -continue amiodarone 200mg  qd -Continue eliquis 2.5mg  bid -Continue toprol 50mg  qd  -Diltiazem discontinued  Hypothyroidism  -Continue levothyroxine 263mcg. -Oupatient endocrinology follow upnecessaryfor dosage adjustment with concomitant amiodarone use.  Dispo: Anticipated discharge in approximately 2-3 day(s).   Lars Mage, MD Internal Medicine PGY1 Pager:[phone] 12/06/2017, 7:11 AM

## 2017-12-06 NOTE — Progress Notes (Signed)
  Date: 12/06/2017  Patient name: [name]  Medical record number: [id number]  Date of birth: January 10, 1932   I have seen and evaluated this patient and I have discussed the plan of care with the house staff. Please see their note for complete details. I concur with their findings with the following additions/corrections:   [name] continues in sinus rhythm after her cardioversion yesterday, but still reports feeling somewhat "blah". She continues to appear volume overloaded on exam, and we will continue IV diuresis today. We also will continue Acetasol mind for the time being given her significant metabolic alkalosis from aggressive diuresis. Hopefully we will get her euvolemic in the next few days and be able to discharge her to SNF at that time.  [name], MD 12/06/2017, 3:08 PM

## 2017-12-07 ENCOUNTER — Encounter (HOSPITAL_COMMUNITY): Payer: Self-pay | Admitting: Internal Medicine

## 2017-12-07 DIAGNOSIS — R51 Headache: Secondary | ICD-10-CM

## 2017-12-07 LAB — BASIC METABOLIC PANEL
Anion gap: 14 (ref 5–15)
BUN: 46 mg/dL — ABNORMAL HIGH (ref 6–20)
CHLORIDE: 84 mmol/L — AB (ref 101–111)
CO2: 34 mmol/L — ABNORMAL HIGH (ref 22–32)
Calcium: 8.9 mg/dL (ref 8.9–10.3)
Creatinine, Ser: 1.79 mg/dL — ABNORMAL HIGH (ref 0.44–1.00)
GFR calc Af Amer: 29 mL/min — ABNORMAL LOW (ref >60)
GFR calc non Af Amer: 25 mL/min — ABNORMAL LOW (ref >60)
GLUCOSE: 120 mg/dL — AB (ref 65–99)
POTASSIUM: 3.8 mmol/L (ref 3.5–5.1)
Sodium: 132 mmol/L — ABNORMAL LOW (ref 135–145)

## 2017-12-07 MED ORDER — FUROSEMIDE 10 MG/ML IJ SOLN
80.0000 mg | Freq: Two times a day (BID) | INTRAMUSCULAR | Status: DC
  Administered 2017-12-07 (×2): 80 mg via INTRAVENOUS
  Filled 2017-12-07 (×2): qty 8

## 2017-12-07 NOTE — Progress Notes (Addendum)
   Subjective: [name] was seen laying in her bed doing well this morning. She states that her breathing is doing better and she does not have any chest discomfort. She mentioned that she has a headache.  Objective:  Vital signs in last 24 hours: Vitals:   12/06/17 1928 12/07/17 0531 12/07/17 0953 12/07/17 1601  BP: (!) 119/44 (!) 126/53 (!) 115/48 (!) 125/46  Pulse: (!) 58 (!) 57 (!) 58 (!) 58  Resp: 18 18  18   Temp: 98.1 F (36.7 C) 97.8 F (36.6 C)  98.1 F (36.7 C)  TempSrc: Oral Oral  Oral  SpO2: 95% 93% 95% 95%  Weight:  180 lb 8 oz (81.9 kg)    Height:       Physical Exam  Constitutional: She appears well-developed and well-nourished. No distress.  HENT:  Head: Normocephalic and atraumatic.  Eyes: Conjunctivae are normal.  Neck: JVD (difficult to ascertain) present.  Cardiovascular: Normal rate, regular rhythm, normal heart sounds and intact distal pulses.  Respiratory: Breath sounds normal. No respiratory distress. She has no wheezes.  GI: Soft. Bowel sounds are normal. She exhibits no distension. There is no tenderness.  Musculoskeletal: She exhibits no edema.  Neurological: She is alert.  Skin: She is not diaphoretic.  Psychiatric: She has a normal mood and affect. Her behavior is normal. Thought content normal.    Assessment/Plan:  Ms. Kneeland is admitted with heart failure exacerbation thought to be due to atrial fibrillation and uncontrolled hypothyroidism.   Acute on chronic HFrEF: The patient diuresed -101mL over the past 24 hrs, her cr=1.79 from prev 1.6 and bicarb=34. The patient's weight today is 180lbs. Per hf recs the patient will get one more day of diuresis. The patient is normotensive today.  -Continue toprol 50mg  qd  -continue acetazolamide 500mg  bid -Continue lasix 80mg  bid  -Continue spironolactone 12.5mg   -Continue TED hose  -Heart healthy with fluid restriction to 122mL  Left bundle branch block  -will consider CRT once the  patient's foot wound heals   Hypokalemia Resolved k=3.8 today  Right Dorsal Foot Ulceration: Will monitor for resolution.  CKD III -followdailyBMP-the patient's increased to cr=1.79 from 1.65 previous  Chronic Atrial Fibrillation Cardioverted on 12/05/17. Per telemetry the patient continues to be in normal sinus rhythm.  -continue amiodarone 200mg  qd -Continue eliquis 2.5mg  bid -Continue toprol 50mg  qd  -Diltiazem discontinued  Hypothyroidism  -Continue levothyroxine 257mcg -Will get tsh and t4 in 2 weeks to reassess appropriateness of dose -Oupatient endocrinology follow upnecessaryfor dosage adjustment with concomitant amiodarone use.  Dispo: Anticipated discharge in approximately 1-2 day(s).   Lars Mage, MD 12/07/2017, 4:24 PM Pager: Pager: [phone]

## 2017-12-07 NOTE — Progress Notes (Signed)
PT Cancellation Note  Patient Details Name: [name] MRN: [id number] DOB: 1932/02/17   Cancelled Treatment:    Reason Eval/Treat Not Completed: (P) Fatigue/lethargy limiting ability to participate(Pt reports she wants to rest until after lunch and then she will participate in therapy.  Plan for tx this afternoon as time permits per POC.  )   Chantee Cerino Eli Hose 12/07/2017, 12:08 PM  Governor Rooks, PTA pager [phone]

## 2017-12-07 NOTE — Progress Notes (Signed)
  Date: 12/07/2017  Patient name: [name]  Medical record number: [id number]  Date of birth: 01-15-1932   I have seen and evaluated this patient and I have discussed the plan of care with the house staff. Please see their note for complete details. I concur with their findings with the following additions/corrections:   [name] continues to improve, diuresing well with improved volume status. Agree with Dr. Jeffie Pollock to continue IV diuresis today and likely transition to by mouth tomorrow. She remains in sinus rhythm after cardioversion we have continued her on amiodarone. She likely can go to SNF in the next day or 2.  [name], MD 12/07/2017, 4:49 PM

## 2017-12-07 NOTE — Progress Notes (Signed)
Advanced Heart Failure Rounding Note  Primary Cardiologist: Wynonia Lawman  Subjective:    Underwent successful DCCV 12/05/17  RUE [place] 12/06/17 with no evidence of DVT. Sub Q fluid noted in forearm  Breathing a little "ragged" this morning, but now off O2 and maintaining sats. Weighed standing this am per pt was 179 lbs.  Was able to sit in chair for several hours and felt a bit better. Maintaining NSR  BMET pending this am.  Negative 1 L.  Bed weights inaccurate.  Down at least 24 lbs from highest weight this admission.   Objective:   Weight Range: 180 lb 8 oz (81.9 kg) Body mass index is 30.98 kg/m.   Vital Signs:   Temp:  [97.8 F (36.6 C)-98.1 F (36.7 C)] 97.8 F (36.6 C) (01/10 0531) Pulse Rate:  [57-59] 57 (01/10 0531) Resp:  [18] 18 (01/10 0531) BP: (105-126)/(39-53) 126/53 (01/10 0531) SpO2:  [90 %-95 %] 93 % (01/10 0531) Weight:  [180 lb 8 oz (81.9 kg)] 180 lb 8 oz (81.9 kg) (01/10 0531) Last BM Date: 12/04/17  Weight change: Filed Weights   12/05/17 0523 12/06/17 0221 12/07/17 0531  Weight: 180 lb 9.6 oz (81.9 kg) 178 lb 14.4 oz (81.1 kg) 180 lb 8 oz (81.9 kg)    Intake/Output:   Intake/Output Summary (Last 24 hours) at 12/07/2017 0738 Last data filed at 12/07/2017 0558 Gross per 24 hour  Intake 850 ml  Output 1900 ml  Net -1050 ml      Physical Exam    General: elderly. NAD. Lying inbed   HEENT: Normal Neck: Supple. JVP remains ~8-9 cm.. Carotids 2+ bilat; no bruits. No thyromegaly or nodule noted. Cor: PMI nondisplaced. RRR, No M/G/R noted Lungs: CTAB, normal effort. Abdomen: Soft, non-tender, non-distended, no HSM. No bruits or masses. +BS  Extremities: No cyanosis, clubbing, or rash. Trace to 1+ BLE edema. 1+ dependent edema in thighs. RUE with 1+ edema in forearm.  Neuro: Alert & orientedx3, cranial nerves grossly intact. moves all 4 extremities w/o difficulty. Affect pleasant   Telemetry   Sinus Carroll Sage, personally reviewed.   EKG    No  new tracings.   Labs    CBC No results for input(s): WBC, NEUTROABS, HGB, HCT, MCV, PLT in the last 72 hours. Basic Metabolic Panel Recent Labs    12/05/17 0611 12/06/17 0409  NA 135 133*  K 3.1* 4.3  CL 75* 80*  CO2 46* 39*  GLUCOSE 108* 115*  BUN 38* 48*  CREATININE 1.49* 1.65*  CALCIUM 9.0 9.1  MG 1.9  --    Liver Function Tests No results for input(s): AST, ALT, ALKPHOS, BILITOT, PROT, ALBUMIN in the last 72 hours. No results for input(s): LIPASE, AMYLASE in the last 72 hours. Cardiac Enzymes No results for input(s): CKTOTAL, CKMB, CKMBINDEX, TROPONINI in the last 72 hours.  BNP: BNP (last 3 results) Recent Labs    06/17/17 1644 11/24/17 1155  BNP 652.8* 550.6*    ProBNP (last 3 results) No results for input(s): PROBNP in the last 8760 hours.   D-Dimer No results for input(s): DDIMER in the last 72 hours. Hemoglobin A1C No results for input(s): HGBA1C in the last 72 hours. Fasting Lipid Panel No results for input(s): CHOL, HDL, LDLCALC, TRIG, CHOLHDL, LDLDIRECT in the last 72 hours. Thyroid Function Tests No results for input(s): TSH, T4TOTAL, T3FREE, THYROIDAB in the last 72 hours.  Invalid input(s): FREET3  Other results:   Imaging    No results  found.   Medications:     Scheduled Medications: . acetaZOLAMIDE  500 mg Oral BID  . amiodarone  200 mg Oral Daily  . apixaban  2.5 mg Oral BID  . atorvastatin  60 mg Oral q1800  . levothyroxine  200 mcg Oral QAC breakfast  . metoprolol succinate  50 mg Oral Daily  . sodium chloride flush  3 mL Intravenous Q12H  . sodium chloride flush  3 mL Intravenous Q12H  . sodium chloride flush  3 mL Intravenous Q12H  . spironolactone  12.5 mg Oral Daily    Infusions: . sodium chloride    . sodium chloride    . sodium chloride      PRN Medications: acetaminophen **OR** acetaminophen, senna, sodium chloride flush, sodium chloride flush, sodium chloride flush    Patient Profile   84 y/o woman  with h/o CAD s/p LAD stent 2003, HTN, HL, LBBB and persistent AF (since 4/18) admitted with recurrent HF and EF drop to 35-40%   Assessment/Plan   1. Acute on chronic systolic HF with biventricualr failure - Echo personally reviewed. Appears to have EF 35-40% with moderate RV dysfunction and severe MR with dyssynchrony due to LBBB. (EF 45-50% in 7/18)  - suspect EF down due to AF and dyssynchrony from LBBB. ? Component of myxedema. - Volume status remains at least mild/moderately elevated - Give IV lasix 80 mg BID today and follow. May need to consider 1 dose metolazone. Await BMET.  - Continue diamox 500 bid with contraction alkalosis. Bicarb 39 today.  - Continue spiro 12.5 mg daily - Continue toprol 50 mg daily. Watch HR closely. - BP improved, but awaiting BMET to decide on low dose ACE/ARB.  - Suspect she will do better now in NSR. - Can consider CRT once foot wound heals (not candidate for implanted device with open wound)  2. Persistent AF - Now in NSR/sinus brady s/p DCCV 12/05/17 - Continue amiodarone 200 mg daily for now.  - Continue Eliquis 2.5 mg BID for now.  - BMET pending. Follow for Eliquis dosing.    3. LBBB - See discussion above. Suspect dyssynchrony playing a role in LV dysfunction. Consider CRT once foot wound heals.   4. Severe mitral regurgitation - Secondary to LV dilation. Not candidate for MV clip. Suspect it will improve with improvement in HF. No change.   5. CAD s/p LAD stent 2003 - No s/s of ischemia.  Cath 2012 with patent stent and otherwise minimal CAD - continue statin. Off ASA with Eliquis  6. Foot wound - Appreciate WOC support. No change.  7. CKD III - Baseline 1.3 - 1.5 range. - BMET pending today.   8. Hypothyroidism - Suspect due to amio. Remains on low dose amio. May need to stop.  - Continue synthroid.  - Although TSH high. T4 is only mildly reduced and likely safe for anesthesia. No change for now.   9. Hypokalemia -BMET  pending.   10. RUE swelling - R>L edema. [place] negative for DVT. + for edema.   11. Deconditioning - PT recommending SNF.   Addendeum: Creatinine up slightly but remains volume overloaded. Will give IV lasix at least this am, and discuss with MD.   Length of Stay: [place], [place]  12/07/2017, 7:38 AM  Advanced Heart Failure Team Pager [phone] (M-F; 7a - 4p)  Please contact [facility] for night-coverage after hours (4p -7a ) and weekends on [email or web address]  Patient seen and examined with  the above-signed Energy manager and/or Housestaff. I personally reviewed laboratory data, imaging studies and relevant notes. I independently examined the patient and formulated the important aspects of the plan. I have edited the note to reflect any of my changes or salient points. I have personally discussed the plan with the patient and/or family.  Edema much improved. Creatinine up slightly. Continue IV diuresis one more day then switch to po. Maintaining NSR continue amio and eliquis. Can likely go to SNF tomorrow or Saturday.   Glori Bickers, MD  10:48 AM

## 2017-12-07 NOTE — Progress Notes (Signed)
Physical Therapy Treatment Patient Details Name: [name] MRN: [id number] DOB: 04/29/1932 Today's Date: 12/07/2017    History of Present Illness Pt is an 84 yo female with PMH of CAD, CKD 3, HLD, HTN, OSA, Chronic Afib, HFmrEF, and Hypothyroidism.  She presented  with 1 week of increasing shortness of breath, orthopnea. Pt admitted with dx of CHF.     PT Comments    Pt performed gait and functional mobility with increased assistance and close chair follow for gait training.  Pt required chair follow as her knees were actively buckling during transfer to [facility].  Pt required min assist for all functional mobility and remains to require max VCs for encouragement to participate in activity.  Plan next session for continued mobility per POC.  Pt remains appropriate for SNF placement.     Follow Up Recommendations  SNF     Equipment Recommendations  None recommended by PT    Recommendations for Other Services       Precautions / Restrictions Precautions Precautions: Fall Restrictions Weight Bearing Restrictions: No    Mobility  Bed Mobility Overal bed mobility: Needs Assistance Bed Mobility: Supine to Sit;Sit to Supine     Supine to sit: HOB elevated;Min guard     General bed mobility comments: VC for form and safety, min guard for safety   Transfers Overall transfer level: Needs assistance Equipment used: Rolling walker (2 wheeled) Transfers: Sit to/from Stand Sit to Stand: Min assist         General transfer comment: Pt required increased assistance during transfer as patient presents with B knee buckling in standing.  Pt remains to require cues for hand placement  Ambulation/Gait Ambulation/Gait assistance: Min assist Ambulation Distance (Feet): 50 Feet Assistive device: Rolling walker (2 wheeled) Gait Pattern/deviations: Step-through pattern;Trunk flexed;Decreased stride length Gait velocity: decr Gait velocity interpretation: Below normal speed for  age/gender General Gait Details: Min guard for safety, cues for upper trunk control with standing rest break x3, Pt required close chair follow and use of chair to return to room due to fatigue.     Stairs            Wheelchair Mobility    Modified Rankin (Stroke Patients Only)       Balance Overall balance assessment: Needs assistance   Sitting balance-Leahy Scale: Good       Standing balance-Leahy Scale: Fair                              Cognition Arousal/Alertness: Awake/alert Behavior During Therapy: WFL for tasks assessed/performed Overall Cognitive Status: Within Functional Limits for tasks assessed                                        Exercises      General Comments        Pertinent Vitals/Pain Pain Assessment: Faces Faces Pain Scale: Hurts a little bit Pain Location:  R foot Pain Descriptors / Indicators: Sore Pain Intervention(s): Monitored during session;Repositioned    Home Living                      Prior Function            PT Goals (current goals can now be found in the care plan section) Acute Rehab PT Goals Patient Stated Goal: feel better  Potential to Achieve Goals: Good Progress towards PT goals: Progressing toward goals    Frequency    Min 3X/week      PT Plan Current plan remains appropriate    Co-evaluation              AM-PAC PT "6 Clicks" Daily Activity  Outcome Measure  Difficulty turning over in bed (including adjusting bedclothes, sheets and blankets)?: Unable Difficulty moving from lying on back to sitting on the side of the bed? : Unable Difficulty sitting down on and standing up from a chair with arms (e.g., wheelchair, bedside commode, etc,.)?: Unable Help needed moving to and from a bed to chair (including a wheelchair)?: A Little Help needed walking in hospital room?: A Little Help needed climbing 3-5 steps with a railing? : A Lot 6 Click Score: 11    End  of Session Equipment Utilized During Treatment: Gait belt;Oxygen Activity Tolerance: Patient limited by fatigue Patient left: in chair;with chair alarm set;with call bell/phone within reach;with nursing/sitter in room Nurse Communication: Mobility status PT Visit Diagnosis: Other abnormalities of gait and mobility (R26.89);Muscle weakness (generalized) (M62.81)     Time: 2500-3704 PT Time Calculation (min) (ACUTE ONLY): 26 min  Charges:  $Gait Training: 8-22 mins $Therapeutic Activity: 8-22 mins                    G Codes:       Governor Rooks, PTA pager [phone]    Cristela Blue 12/07/2017, 5:45 PM

## 2017-12-08 DIAGNOSIS — Z9981 Dependence on supplemental oxygen: Secondary | ICD-10-CM | POA: Diagnosis not present

## 2017-12-08 DIAGNOSIS — J9601 Acute respiratory failure with hypoxia: Secondary | ICD-10-CM | POA: Diagnosis not present

## 2017-12-08 DIAGNOSIS — Z7901 Long term (current) use of anticoagulants: Secondary | ICD-10-CM | POA: Diagnosis not present

## 2017-12-08 DIAGNOSIS — I5023 Acute on chronic systolic (congestive) heart failure: Secondary | ICD-10-CM | POA: Diagnosis not present

## 2017-12-08 DIAGNOSIS — E874 Mixed disorder of acid-base balance: Secondary | ICD-10-CM | POA: Diagnosis not present

## 2017-12-08 DIAGNOSIS — Z881 Allergy status to other antibiotic agents status: Secondary | ICD-10-CM | POA: Diagnosis not present

## 2017-12-08 DIAGNOSIS — I5022 Chronic systolic (congestive) heart failure: Secondary | ICD-10-CM | POA: Diagnosis not present

## 2017-12-08 DIAGNOSIS — I130 Hypertensive heart and chronic kidney disease with heart failure and stage 1 through stage 4 chronic kidney disease, or unspecified chronic kidney disease: Secondary | ICD-10-CM | POA: Diagnosis not present

## 2017-12-08 DIAGNOSIS — E039 Hypothyroidism, unspecified: Secondary | ICD-10-CM | POA: Diagnosis not present

## 2017-12-08 DIAGNOSIS — Z6830 Body mass index (BMI) 30.0-30.9, adult: Secondary | ICD-10-CM | POA: Diagnosis not present

## 2017-12-08 DIAGNOSIS — K219 Gastro-esophageal reflux disease without esophagitis: Secondary | ICD-10-CM | POA: Diagnosis not present

## 2017-12-08 DIAGNOSIS — E785 Hyperlipidemia, unspecified: Secondary | ICD-10-CM | POA: Diagnosis not present

## 2017-12-08 DIAGNOSIS — I482 Chronic atrial fibrillation: Secondary | ICD-10-CM | POA: Diagnosis not present

## 2017-12-08 DIAGNOSIS — I2510 Atherosclerotic heart disease of native coronary artery without angina pectoris: Secondary | ICD-10-CM | POA: Diagnosis not present

## 2017-12-08 DIAGNOSIS — S91301D Unspecified open wound, right foot, subsequent encounter: Secondary | ICD-10-CM | POA: Diagnosis not present

## 2017-12-08 DIAGNOSIS — N183 Chronic kidney disease, stage 3 (moderate): Secondary | ICD-10-CM | POA: Diagnosis not present

## 2017-12-08 DIAGNOSIS — F419 Anxiety disorder, unspecified: Secondary | ICD-10-CM | POA: Diagnosis not present

## 2017-12-08 DIAGNOSIS — I4891 Unspecified atrial fibrillation: Secondary | ICD-10-CM | POA: Diagnosis not present

## 2017-12-08 DIAGNOSIS — I481 Persistent atrial fibrillation: Secondary | ICD-10-CM | POA: Diagnosis not present

## 2017-12-08 DIAGNOSIS — I119 Hypertensive heart disease without heart failure: Secondary | ICD-10-CM | POA: Diagnosis not present

## 2017-12-08 DIAGNOSIS — N179 Acute kidney failure, unspecified: Secondary | ICD-10-CM

## 2017-12-08 DIAGNOSIS — G4730 Sleep apnea, unspecified: Secondary | ICD-10-CM | POA: Diagnosis not present

## 2017-12-08 DIAGNOSIS — E876 Hypokalemia: Secondary | ICD-10-CM | POA: Diagnosis not present

## 2017-12-08 DIAGNOSIS — S91301A Unspecified open wound, right foot, initial encounter: Secondary | ICD-10-CM | POA: Diagnosis not present

## 2017-12-08 DIAGNOSIS — R609 Edema, unspecified: Secondary | ICD-10-CM | POA: Diagnosis not present

## 2017-12-08 DIAGNOSIS — M6389 Disorders of muscle in diseases classified elsewhere, multiple sites: Secondary | ICD-10-CM | POA: Diagnosis not present

## 2017-12-08 DIAGNOSIS — R2681 Unsteadiness on feet: Secondary | ICD-10-CM | POA: Diagnosis not present

## 2017-12-08 DIAGNOSIS — Z79890 Hormone replacement therapy (postmenopausal): Secondary | ICD-10-CM | POA: Diagnosis not present

## 2017-12-08 DIAGNOSIS — I447 Left bundle-branch block, unspecified: Secondary | ICD-10-CM | POA: Diagnosis not present

## 2017-12-08 DIAGNOSIS — Z79899 Other long term (current) drug therapy: Secondary | ICD-10-CM | POA: Diagnosis not present

## 2017-12-08 DIAGNOSIS — I5040 Unspecified combined systolic (congestive) and diastolic (congestive) heart failure: Secondary | ICD-10-CM | POA: Diagnosis not present

## 2017-12-08 DIAGNOSIS — Z955 Presence of coronary angioplasty implant and graft: Secondary | ICD-10-CM | POA: Diagnosis not present

## 2017-12-08 DIAGNOSIS — R41841 Cognitive communication deficit: Secondary | ICD-10-CM | POA: Diagnosis not present

## 2017-12-08 DIAGNOSIS — Z885 Allergy status to narcotic agent status: Secondary | ICD-10-CM | POA: Diagnosis not present

## 2017-12-08 DIAGNOSIS — Z880 Allergy status to penicillin: Secondary | ICD-10-CM | POA: Diagnosis not present

## 2017-12-08 DIAGNOSIS — F411 Generalized anxiety disorder: Secondary | ICD-10-CM | POA: Diagnosis not present

## 2017-12-08 DIAGNOSIS — E669 Obesity, unspecified: Secondary | ICD-10-CM | POA: Diagnosis not present

## 2017-12-08 DIAGNOSIS — I509 Heart failure, unspecified: Secondary | ICD-10-CM | POA: Diagnosis not present

## 2017-12-08 DIAGNOSIS — I5020 Unspecified systolic (congestive) heart failure: Secondary | ICD-10-CM | POA: Diagnosis not present

## 2017-12-08 DIAGNOSIS — M6281 Muscle weakness (generalized): Secondary | ICD-10-CM | POA: Diagnosis not present

## 2017-12-08 DIAGNOSIS — R278 Other lack of coordination: Secondary | ICD-10-CM | POA: Diagnosis not present

## 2017-12-08 DIAGNOSIS — H9190 Unspecified hearing loss, unspecified ear: Secondary | ICD-10-CM | POA: Diagnosis not present

## 2017-12-08 DIAGNOSIS — K449 Diaphragmatic hernia without obstruction or gangrene: Secondary | ICD-10-CM | POA: Diagnosis not present

## 2017-12-08 LAB — BASIC METABOLIC PANEL
Anion gap: 13 (ref 5–15)
BUN: 53 mg/dL — ABNORMAL HIGH (ref 6–20)
CALCIUM: 8.7 mg/dL — AB (ref 8.9–10.3)
CHLORIDE: 84 mmol/L — AB (ref 101–111)
CO2: 33 mmol/L — ABNORMAL HIGH (ref 22–32)
CREATININE: 2.05 mg/dL — AB (ref 0.44–1.00)
GFR, EST AFRICAN AMERICAN: 25 mL/min — AB (ref >60)
GFR, EST NON AFRICAN AMERICAN: 21 mL/min — AB (ref >60)
Glucose, Bld: 150 mg/dL — ABNORMAL HIGH (ref 65–99)
Potassium: 2.8 mmol/L — ABNORMAL LOW (ref 3.5–5.1)
Sodium: 130 mmol/L — ABNORMAL LOW (ref 135–145)

## 2017-12-08 MED ORDER — MAGNESIUM SULFATE 2 GM/50ML IV SOLN
2.0000 g | Freq: Once | INTRAVENOUS | Status: AC
  Administered 2017-12-08: 2 g via INTRAVENOUS
  Filled 2017-12-08: qty 50

## 2017-12-08 MED ORDER — FUROSEMIDE 40 MG PO TABS: 80.0000 mg | ORAL_TABLET | Freq: Every day | ORAL | 0 refills | Status: DC

## 2017-12-08 MED ORDER — POTASSIUM CHLORIDE CRYS ER 20 MEQ PO TBCR
40.0000 meq | EXTENDED_RELEASE_TABLET | Freq: Two times a day (BID) | ORAL | Status: DC
  Administered 2017-12-08: 40 meq via ORAL
  Filled 2017-12-08: qty 2

## 2017-12-08 MED ORDER — METOPROLOL SUCCINATE ER 50 MG PO TB24: 50.0000 mg | ORAL_TABLET | Freq: Every day | ORAL | 0 refills | Status: DC

## 2017-12-08 MED ORDER — LEVOTHYROXINE SODIUM 200 MCG PO TABS: 200.0000 ug | ORAL_TABLET | Freq: Every day | ORAL | 0 refills | Status: DC

## 2017-12-08 MED ORDER — SPIRONOLACTONE 25 MG PO TABS: 12.5000 mg | ORAL_TABLET | Freq: Every day | ORAL | 0 refills | Status: DC

## 2017-12-08 MED ORDER — APIXABAN 2.5 MG PO TABS: 2.5000 mg | ORAL_TABLET | Freq: Two times a day (BID) | ORAL | 0 refills | Status: DC

## 2017-12-08 NOTE — Progress Notes (Signed)
   Subjective: Ms. Ulrich was seen and evaluated today at bedside. She feels tired but otherwise has no complaints. Pleased to see her arms are no longer swollen.   Objective:  Vital signs in last 24 hours: Vitals:   12/07/17 0953 12/07/17 1601 12/07/17 1930 12/08/17 0617  BP: (!) 115/48 (!) 125/46 (!) 119/49 (!) 125/47  Pulse: (!) 58 (!) 58 60 63  Resp:  18 18 18   Temp:  98.1 F (36.7 C) 98.9 F (37.2 C) 98.6 F (37 C)  TempSrc:  Oral Oral Oral  SpO2: 95% 95% 96% 95%  Weight:    176 lb 14.4 oz (80.2 kg)  Height:       Physical Exam  General: In no acute distress. Resting comfortably in bed. Looks tired.  Cardiovascular: Regular rate and rhythm. No murmur or rub appreciated. Pulmonary: CTA BL. Unlabored breathing.  Abdomen: Soft, non-tender and non-distended. No guarding or rigidity. +bowel sounds.  Extremities: Trace edema to ankles. Intact distal pulses. No gross deformities. Skin: Warm, dry. No cyanosis  Psych: Mood normal and affect was mood congruent. Responds to questions appropriately.   Assessment/Plan:  Acute on Chronic HFrEF due to AFib with RVR and uncontrolled severe hypothyroidism She continues to diurese well and weight today is 176 lbs. She is down nearly 30 lbs since admission. She is off of supplemental O2 and is breathing comfortably. We are holding Lasix today given her bump in Cr and euvolemic/touch dry appearance and will restart on PO Lasix 1/13.  She does have trace peripheral edema however suspect some of this could be due to her severe hypothyroidism. She seems stable for discharge to SNF today with close follow-up with Dr. Wynonia Lawman.  -Hold lasix today, resume 1/13 -Continue spiro 12.5mg  -Continue Toprol 50mg  daily -DC to SNF today -Will need close follow-up with cardiology -Considering CRT when foot wound heals.   CKD-3 Baseline Cr unclear but seems to be around 1.6-1.7. She is currently 2, will be monitored at SNF.   AF, now NSR following DCCV She  remains in NSR following DCCV 12/05/17. She remains on Amiodarone 200 mg daily and Eliquis 2.5mg  BID.   Foot wound Will need continued wound care of her right foot while in SNF as this must be healed before consideration of CRT.   Hypothyroidism Does have underlying hypothyroid dysfunction as she was on synthroid many years ago however suspect large part of this now is due to [facility]. Continuing high-dose synthroid replacement for now which will need to be adjusted should patient come off amio in the future.   Disposition: DC to SNF today  Einar Gip, DO Internal Medicine PGY2 [phone]

## 2017-12-08 NOTE — Discharge Instructions (Signed)
Thank you for allowing [place] to provide your care.  We have made multiple changes to her medications.  It is important that you follow-up with the heart failure clinic, your cardiologist (Dr. Chalmers Cater), and your primary care provider.   Information on my medicine - ELIQUIS (apixaban)  This medication education was reviewed with me or my healthcare representative as part of my discharge preparation.   Why was Eliquis prescribed for you? Eliquis was prescribed for you to reduce the risk of a blood clot forming that can cause a stroke if you have a medical condition called atrial fibrillation (a type of irregular heartbeat).  What do You need to know about Eliquis ? Take your Eliquis TWICE DAILY - one tablet in the morning and one tablet in the evening with or without food. If you have difficulty swallowing the tablet whole please discuss with your pharmacist how to take the medication safely.  Take Eliquis exactly as prescribed by your doctor and DO NOT stop taking Eliquis without talking to the doctor who prescribed the medication.  Stopping may increase your risk of developing a stroke.  Refill your prescription before you run out.  After discharge, you should have regular check-up appointments with your healthcare provider that is prescribing your Eliquis.  In the future your dose may need to be changed if your kidney function or weight changes by a significant amount or as you get older.  What do you do if you miss a dose? If you miss a dose, take it as soon as you remember on the same day and resume taking twice daily.  Do not take more than one dose of ELIQUIS at the same time to make up a missed dose.  Important Safety Information A possible side effect of Eliquis is bleeding. You should call your healthcare provider right away if you experience any of the following: ? Bleeding from an injury or your nose that does not stop. ? Unusual colored urine (red or dark brown) or unusual  colored stools (red or black). ? Unusual bruising for unknown reasons. ? A serious fall or if you hit your head (even if there is no bleeding).  Some medicines may interact with Eliquis and might increase your risk of bleeding or clotting while on Eliquis. To help avoid this, consult your healthcare provider or pharmacist prior to using any new prescription or non-prescription medications, including herbals, vitamins, non-steroidal anti-inflammatory drugs (NSAIDs) and supplements.  This website has more information on Eliquis (apixaban): http://www.eliquis.com/eliquis/home

## 2017-12-08 NOTE — Discharge Summary (Signed)
Name: [name] MRN: [id number] DOB: May 04, 1932 84 y.o. PCP: Fanny Bien, MD  Date of Admission: 11/24/2017 10:39 AM Date of Discharge:  Attending Physician: [name], MD  Discharge Diagnosis: 1. HFrEF, Acute Exacerbation. 2. Persistent atrial fibrillation. 3. Hypothyroidism.  Principal Problem:   Acute exacerbation of CHF (congestive heart failure) (HCC) Active Problems:   Chronic atrial fibrillation with RVR (HCC)   Acute on chronic systolic heart failure (HCC)   Hypothyroidism   Myxedema   Pleural effusion, right   Metabolic alkalosis  Discharge Medications: Allergies as of 12/08/2017      Reactions   Ampicillin Shortness Of Breath, Swelling, Rash   Hydromorphone Hcl Shortness Of Breath, Itching, Swelling, Rash   Penicillins Anaphylaxis   Has patient had a PCN reaction causing immediate rash, facial/tongue/throat swelling, SOB or lightheadedness with hypotension: Yes Has patient had a PCN reaction causing severe rash involving mucus membranes or skin necrosis: Unknown Has patient had a PCN reaction that required hospitalization: Yes (was in the hosp) Has patient had a PCN reaction occurring within the last 10 years: No If all of the above answers are "NO", then may proceed with Cephalosporin use.      Medication List    STOP taking these medications   atenolol 50 MG tablet Commonly known as:  TENORMIN   diltiazem 180 MG 24 hr capsule Commonly known as:  CARDIZEM CD     TAKE these medications   acetaminophen 325 MG tablet Commonly known as:  TYLENOL Take 2 tablets (650 mg total) by mouth every 6 (six) hours as needed for mild pain (or Fever >/= 101).   amiodarone 200 MG tablet Commonly known as:  PACERONE Take 1 tablet (200 mg total) by mouth daily.   apixaban 2.5 MG Tabs tablet Commonly known as:  ELIQUIS Take 1 tablet (2.5 mg total) by mouth 2 (two) times daily. What changed:    medication strength  how much to take     atorvastatin 80 MG tablet Commonly known as:  LIPITOR Take 1 tablet (80 mg total) by mouth daily.   butalbital-acetaminophen-caffeine 50-325-40 MG tablet Commonly known as:  FIORICET, ESGIC Take 1 tablet by mouth every 4 (four) hours as needed (for headaches).   furosemide 40 MG tablet Commonly known as:  LASIX Take 2 tablets (80 mg total) by mouth daily. Please take an additional 40 mg (1 tablet) in the evening if your weight goes higher than 182 lbs Start taking on:  12/10/2017 What changed:    medication strength  how much to take  when to take this  additional instructions  These instructions start on 12/10/2017. If you are unsure what to do until then, ask your doctor or other care provider.   levothyroxine 200 MCG tablet Commonly known as:  SYNTHROID, LEVOTHROID Take 1 tablet (200 mcg total) by mouth daily before breakfast. Start taking on:  12/09/2017 What changed:    medication strength  how much to take  when to take this   metoprolol succinate 50 MG 24 hr tablet Commonly known as:  TOPROL-XL Take 1 tablet (50 mg total) by mouth daily. Take with or immediately following a meal. Start taking on:  12/09/2017   omeprazole 20 MG tablet Commonly known as:  PRILOSEC OTC Take 20 mg by mouth daily as needed (for reflux).   polyethylene glycol packet Commonly known as:  MIRALAX / GLYCOLAX Take 17 g by mouth daily.   senna-docusate 8.6-50 MG tablet Commonly known as:  Senokot-S Take 1 tablet by mouth at bedtime.   spironolactone 25 MG tablet Commonly known as:  ALDACTONE Take 0.5 tablets (12.5 mg total) by mouth daily. Start taking on:  12/09/2017     Disposition and follow-up:   [name] was discharged from [facility] in Stable condition.  At the hospital follow up visit please address:  1.  Heart failure exacerbation.  Please ensure that the patient has followed up with cardiology. Please review her medications to ensure she is  taking the appropriate doses and medications. Please discuss whether she is a candidate for CRT.  Hypothyroidism. Please repeat her TSH/T4 in approximately 4 weeks.  2.  Labs / imaging needed at time of follow-up: BMP, TSH/T4 (4 weeks)  3.  Pending labs/ test needing follow-up: None  Follow-up Appointments:  Contact information for follow-up providers    [place]. Go on 12/20/2017.   Specialty:  Cardiology Why:  1:30 PM, [facility], parking code [id number] Contact information: [address] [id number] [place] [address] [phone]       Fanny Bien, MD. Schedule an appointment as soon as possible for a visit.   Specialty:  Family Medicine Contact information: [address] [place] [address] [phone]        Jacolyn Reedy, MD. Schedule an appointment as soon as possible for a visit.   Specialty:  Cardiology Contact information: [place] [address] [phone]            Contact information for after-discharge care    Destination    [facility] .   Service:  Skilled Nursing Contact information: [place] [facility] Course by problem list: Principal Problem:   Acute exacerbation of CHF (congestive heart failure) (HCC) Active Problems:   Chronic atrial fibrillation with RVR (HCC)   Acute on chronic systolic heart failure (HCC)   Hypothyroidism   Myxedema   Pleural effusion, right   Metabolic alkalosis   HFrEF, Acute Exacerbation.  [name] is an 84 year old female with a past medical history significant for CAD, chronic kidney disease stage III, hyperlipidemia, hypertension, chronic A. fib, hypothyroidism, and heart failure reduced ejection fraction who presented to the ED with signs and symptoms of volume overload  secondary to acute heart failure exacerbation. Patient was subsequently aggressively diuresed and heart failure team was consulted. Her volume status significantly improved and she diuresed nearly 30 pounds. She was taken for cardioversion as it was felt that her A. fib was likely contributing to her difficult to manage her heart failure. She maintained normal sinus rhythm after cardioversion. On the day of discharge her creatinine mildly increased, and heart failure team recommended holding further diuresis until 1/3. Patient was stable for discharge and will follow up with Dr. Wynonia Lawman as an outpatient. At which point heart failure recommends consideration for CRT placement (not performed in the hospital due to lesions on the right foot). The following changes were made to her heart failure management; Lasix 80 mg daily starting on 1/3 (patient given instructions to take an extra 40 mg in the afternoon if weight is greater than 182 pounds), amiodarone 200 mg daily, Eliquis 2.5 mg twice daily, metoprolol XL 50 mg daily, spironolactone 12.5 mg daily, and atorvastatin 80 mg  daily. The patient's diltiazem was discontinued during his hospitalization. She was discharged to a skilled nursing facility in stable condition.  Persistent atrial fibrillation. During this hospitalization the patient underwent DC-CV and subsequently remained in normal sinus rhythm. She was continued on low-dose amnio and Eliquis.  Hypothyroidism. Patient has a known history of hypothyroidism. Was previously on levothyroxine. Patient's TSH was checked during this hospitalization and found to be greater than 100. Due to the concurrent use of amiodarone, her levothyroxine was increased to 200 mcg daily. She was educated about how to take her thyroid medicine. She will need to follow-up with her primary care and repeat TSH/T4 will need to be obtained in 2-4 weeks.  Discharge Vitals:   BP (!) 139/51 (BP Location: Right Arm)   Pulse 61   Temp  98.2 F (36.8 C) (Oral)   Resp 18   Ht 5\' 4"  (1.626 m)   Wt 176 lb 14.4 oz (80.2 kg)   SpO2 96%   BMI 30.36 kg/m   Pertinent Labs, Studies, and Procedures:   Echocardiogram  - Left ventricle: The cavity size was normal. Wall thickness was increased in a pattern of mild LVH. Systolic function was mildly to moderately reduced. The estimated ejection fraction was in the range of 40% to 45%. Wall motion was normal; there were no regional wall motion abnormalities. - Ventricular septum: Septal motion showed abnormal function and dyssynergy. These changes are consistent with a left bundle branch block. - Aortic valve: There was mild regurgitation. - Mitral valve: Mildly calcified annulus. Mildly calcified leaflets. There was moderate to severe regurgitation. - Left atrium: The atrium was mildly to moderately dilated. - Right atrium: The atrium was mildly dilated. - Tricuspid valve: There was moderate regurgitation. - Pulmonary arteries: Systolic pressure was moderately increased. PA peak pressure: 60 mm Hg (S).  Discharge Instructions: Discharge Instructions    (HEART FAILURE PATIENTS) Call MD:  Anytime you have any of the following symptoms: 1) 3 pound weight gain in 24 hours or 5 pounds in 1 week 2) shortness of breath, with or without a dry hacking cough 3) swelling in the hands, feet or stomach 4) if you have to sleep on extra pillows at night in order to breathe.   Complete by:  As directed    Call MD for:  difficulty breathing, headache or visual disturbances   Complete by:  As directed    Call MD for:  persistant nausea and vomiting   Complete by:  As directed    Diet - low sodium heart healthy   Complete by:  As directed    Discharge instructions   Complete by:  As directed    Please take an extra tablet (40 mg) of your furosemide in the evening if your weight goes higher than 182 pounds.   Increase activity slowly   Complete by:  As directed      Signed: Ina Homes,  MD 12/08/2017, 2:09 PM   My Pager: [phone]

## 2017-12-08 NOTE — Progress Notes (Signed)
Physical Therapy Treatment Patient Details Name: [name] MRN: [id number] DOB: Aug 16, 1932 Today's Date: 12/08/2017    History of Present Illness Pt is an 84 yo female with PMH of CAD, CKD 3, HLD, HTN, OSA, Chronic Afib, HFmrEF, and Hypothyroidism.  She presented  with 1 week of increasing shortness of breath, orthopnea. Pt admitted with dx of CHF.     PT Comments    Pt performed increased gait with max cues for encouragement to progress mobility.  Pt remains to require min assist for activity.  Plan to d/c to SNF this pm.  Plan remains appropriate.     Follow Up Recommendations  SNF     Equipment Recommendations  None recommended by PT    Recommendations for Other Services       Precautions / Restrictions Precautions Precautions: Fall Restrictions Weight Bearing Restrictions: No    Mobility  Bed Mobility Overal bed mobility: Needs Assistance Bed Mobility: Supine to Sit;Sit to Supine     Supine to sit: HOB elevated;Min guard Sit to supine: HOB elevated;Min assist   General bed mobility comments: Cues for hand placement during transition and tactile cues and assist for LEs back to bed.    Transfers Overall transfer level: Needs assistance Equipment used: Rolling walker (2 wheeled) Transfers: Sit to/from Stand Sit to Stand: Min assist         General transfer comment: Pt performed with cues for safe hand placement.  During return to seated surface, pt remains holding to the RW for support.  Educated this is not safe practice.    Ambulation/Gait Ambulation/Gait assistance: Min assist Ambulation Distance (Feet): 50 Feet(x2 max encouragement to perform second trial.  ) Assistive device: Rolling walker (2 wheeled) Gait Pattern/deviations: Step-through pattern;Trunk flexed;Decreased stride length Gait velocity: decr Gait velocity interpretation: Below normal speed for age/gender General Gait Details: Min guard for safety, cues for upper trunk control with  standing rest break x3, Pt required close chair follow. No buckling noted during gait training.     Stairs            Wheelchair Mobility    Modified Rankin (Stroke Patients Only)       Balance Overall balance assessment: Needs assistance   Sitting balance-Leahy Scale: Good       Standing balance-Leahy Scale: Fair                              Cognition Arousal/Alertness: Awake/alert Behavior During Therapy: WFL for tasks assessed/performed Overall Cognitive Status: Within Functional Limits for tasks assessed                                        Exercises      General Comments        Pertinent Vitals/Pain Pain Assessment: 0-10 Pain Location:  R foot Pain Descriptors / Indicators: Sore Pain Intervention(s): Monitored during session;Repositioned    Home Living                      Prior Function            PT Goals (current goals can now be found in the care plan section) Acute Rehab PT Goals Patient Stated Goal: feel better Potential to Achieve Goals: Good Progress towards PT goals: Progressing toward goals    Frequency    Min  3X/week      PT Plan Current plan remains appropriate    Co-evaluation              AM-PAC PT "6 Clicks" Daily Activity  Outcome Measure  Difficulty turning over in bed (including adjusting bedclothes, sheets and blankets)?: Unable Difficulty moving from lying on back to sitting on the side of the bed? : Unable Difficulty sitting down on and standing up from a chair with arms (e.g., wheelchair, bedside commode, etc,.)?: Unable Help needed moving to and from a bed to chair (including a wheelchair)?: A Little Help needed walking in hospital room?: A Little Help needed climbing 3-5 steps with a railing? : A Lot 6 Click Score: 11    End of Session Equipment Utilized During Treatment: Gait belt;Oxygen Activity Tolerance: Patient limited by fatigue Patient left: in  chair;with chair alarm set;with call bell/phone within reach;with nursing/sitter in room Nurse Communication: Mobility status PT Visit Diagnosis: Other abnormalities of gait and mobility (R26.89);Muscle weakness (generalized) (M62.81)     Time: 1829-9371 PT Time Calculation (min) (ACUTE ONLY): 18 min  Charges:  $Gait Training: 8-22 mins                    G Codes:       Governor Rooks, PTA pager [phone]    Cristela Blue 12/08/2017, 3:25 PM

## 2017-12-08 NOTE — Clinical Social Work Placement (Signed)
   CLINICAL SOCIAL WORK PLACEMENT  NOTE  Date:  12/08/2017  Patient Details  Name: [name] MRN: [id number] Date of Birth: 04-Sep-1932  Clinical Social Work is seeking post-discharge placement for this patient at the [place] level of care (*CSW will initial, date and re-position this form in  chart as items are completed):  Yes   Patient/family provided with [facility] Work Department's list of facilities offering this level of care within the geographic area requested by the patient (or if unable, by the patient's family).  Yes   Patient/family informed of their freedom to choose among providers that offer the needed level of care, that participate in Medicare, Medicaid or managed care program needed by the patient, have an available bed and are willing to accept the patient.  Yes   Patient/family informed of [facility]'s ownership interest in [facility] and [facility], as well as of the fact that they are under no obligation to receive care at these facilities.  PASRR submitted to EDS on 11/30/17     PASRR number received on 12/04/17     Existing PASRR number confirmed on       FL2 transmitted to all facilities in geographic area requested by pt/family on 11/30/17     FL2 transmitted to all facilities within larger geographic area on       Patient informed that his/her managed care company has contracts with or will negotiate with certain facilities, including the following:        Yes   Patient/family informed of bed offers received.  Patient chooses bed at [facility] [place]     Physician recommends and patient chooses bed at      Patient to be transferred to [facility] on 12/08/17.  Patient to be transferred to facility by [facility]     Patient family notified on 12/08/17 of transfer.  Name of family member notified:  Efraim Kaufmann     PHYSICIAN       Additional Comment:     _______________________________________________ Candie Chroman, LCSW 12/08/2017, 2:22 PM

## 2017-12-08 NOTE — Progress Notes (Signed)
Potassium low when labs were drawn. Messaged Dr. To see if going to be redrawn before discharge. Stated they would be drawn at follow up after discharge.

## 2017-12-08 NOTE — Clinical Social Work Note (Signed)
Patient has a private room at [facility] today.  Dayton Scrape, [place]

## 2017-12-08 NOTE — Progress Notes (Signed)
Called report to [facility] and spoke with a nurse named Risk manager. Reviewed AVS with pt. Answered her questions. Pt is waiting on transport.

## 2017-12-08 NOTE — Clinical Social Work Note (Signed)
CSW facilitated patient discharge including contacting patient family and facility to confirm patient discharge plans. Clinical information faxed to facility and family agreeable with plan. CSW arranged ambulance transport via [facility] to [facility] at 3:30 pm. RN to call report prior to discharge [phone] Room 210).  CSW will sign off for now as social work intervention is no longer needed. Please consult [place] again if new needs arise.  Dayton Scrape, [place]

## 2017-12-08 NOTE — Progress Notes (Signed)
Advanced Heart Failure Rounding Note  Primary Cardiologist: Wynonia Lawman  Subjective:    Underwent successful DCCV 12/05/17  RUE [place] 12/06/17 with no evidence of DVT. Sub Q fluid noted in forearm  Feeling better this am. Still fatigued. RUE swelling almost completely gone. Legs improving. Denies SOB.   Creatinine trending up. 1.49 -> 1.65 -> 1.79 -> 2.05. BUN 53.  Negative 3 L and down 4 more lbs.  Down 28 lbs from highest weight this admission.    Objective:   Weight Range: 176 lb 14.4 oz (80.2 kg) Body mass index is 30.36 kg/m.   Vital Signs:   Temp:  [98.1 F (36.7 C)-98.9 F (37.2 C)] 98.6 F (37 C) (01/11 0617) Pulse Rate:  [58-63] 63 (01/11 0617) Resp:  [18] 18 (01/11 0617) BP: (115-125)/(46-49) 125/47 (01/11 0617) SpO2:  [95 %-96 %] 95 % (01/11 0617) Weight:  [176 lb 14.4 oz (80.2 kg)] 176 lb 14.4 oz (80.2 kg) (01/11 0617) Last BM Date: 12/04/17  Weight change: Filed Weights   12/06/17 0221 12/07/17 0531 12/08/17 0617  Weight: 178 lb 14.4 oz (81.1 kg) 180 lb 8 oz (81.9 kg) 176 lb 14.4 oz (80.2 kg)    Intake/Output:   Intake/Output Summary (Last 24 hours) at 12/08/2017 0735 Last data filed at 12/08/2017 0604 Gross per 24 hour  Intake 533 ml  Output 3600 ml  Net -3067 ml      Physical Exam    General: Elderly. NAD. Lying in bed.  HEENT: Normal Neck: Supple. JVP ~7-8 cm. Carotids 2+ bilat; no bruits. No thyromegaly or nodule noted. Cor: PMI nondisplaced. RRR, No M/G/R noted Lungs: CTAB, normal effort. No wheeze  Abdomen: Soft, non-tender, non-distended, no HSM. No bruits or masses. +BS  Extremities: No cyanosis, clubbing, or rash. Trace to 1+ ankle edema. Trace edema in R forearm, much improved. Wound dressing on R foot.  Neuro: Alert & orientedx3, cranial nerves grossly intact. moves all 4 extremities w/o difficulty. Affect pleasant   Telemetry   NSR/Sinus brady, 50-60s, personally reviewed.   EKG    No new tracings.   Labs    CBC No results  for input(s): WBC, NEUTROABS, HGB, HCT, MCV, PLT in the last 72 hours. Basic Metabolic Panel Recent Labs    12/07/17 0606 12/08/17 0339  NA 132* 130*  K 3.8 2.8*  CL 84* 84*  CO2 34* 33*  GLUCOSE 120* 150*  BUN 46* 53*  CREATININE 1.79* 2.05*  CALCIUM 8.9 8.7*   Liver Function Tests No results for input(s): AST, ALT, ALKPHOS, BILITOT, PROT, ALBUMIN in the last 72 hours. No results for input(s): LIPASE, AMYLASE in the last 72 hours. Cardiac Enzymes No results for input(s): CKTOTAL, CKMB, CKMBINDEX, TROPONINI in the last 72 hours.  BNP: BNP (last 3 results) Recent Labs    06/17/17 1644 11/24/17 1155  BNP 652.8* 550.6*    ProBNP (last 3 results) No results for input(s): PROBNP in the last 8760 hours.   D-Dimer No results for input(s): DDIMER in the last 72 hours. Hemoglobin A1C No results for input(s): HGBA1C in the last 72 hours. Fasting Lipid Panel No results for input(s): CHOL, HDL, LDLCALC, TRIG, CHOLHDL, LDLDIRECT in the last 72 hours. Thyroid Function Tests No results for input(s): TSH, T4TOTAL, T3FREE, THYROIDAB in the last 72 hours.  Invalid input(s): FREET3  Other results:   Imaging    No results found.   Medications:     Scheduled Medications: . amiodarone  200 mg Oral Daily  .  apixaban  2.5 mg Oral BID  . atorvastatin  60 mg Oral q1800  . levothyroxine  200 mcg Oral QAC breakfast  . metoprolol succinate  50 mg Oral Daily  . sodium chloride flush  3 mL Intravenous Q12H  . sodium chloride flush  3 mL Intravenous Q12H  . sodium chloride flush  3 mL Intravenous Q12H  . spironolactone  12.5 mg Oral Daily    Infusions: . sodium chloride    . sodium chloride    . sodium chloride      PRN Medications: acetaminophen **OR** acetaminophen, senna, sodium chloride flush, sodium chloride flush, sodium chloride flush    Patient Profile   84 y/o woman with h/o CAD s/p LAD stent 2003, HTN, HL, LBBB and persistent AF (since 4/18) admitted with  recurrent HF and EF drop to 35-40%   Assessment/Plan   1. Acute on chronic systolic HF with biventricualr failure - Echo 11/25/18 previously personally reviewed. Appears to have EF 35-40% with moderate RV dysfunction and severe MR with dyssynchrony due to LBBB. (EF 45-50% in 7/18)  - suspect EF down due to AF and dyssynchrony from LBBB. ? Component of myxedema. - Volume status stable to dry with creatinine bump. Much improved overall.  - Hold lasix today. Pt was only on lasix 20 mg M/W/F at home. Will discuss optimal dosing for home with Dr. Haroldine Laws.  - Off diamox.  - Continue spiro 12.5 mg daily - Continue toprol 50 mg daily. Watch HR closely. - No ACE/ARB for now with AKI.  - Suspect she will do better now in NSR. - Can consider CRT once foot wound heals (not candidate for implanted device with open wound)  2. Persistent AF - Now in NSR/sinus brady s/p DCCV 12/05/17 - Continue amiodarone 200 mg daily.  - Continue Eliquis 2.5 mg BID.  3. LBBB - See discussion above. Suspect dyssynchrony playing a role in LV dysfunction. Consider CRT once foot wound heals. No change.   4. Severe mitral regurgitation - Secondary to LV dilation. Not candidate for MV clip. Suspect it will improve with improvement in HF. No change.   5. CAD s/p LAD stent 2003 - No s/s of ischemia.  Cath 2012 with patent stent and otherwise minimal CAD - continue statin. Off ASA with Eliquis  6. Foot wound - Appreciate WOC support. No change.   7. CKD III - Baseline 1.3 - 1.5 range. - up to 2.0 today with diuresis. Plan as above.   8. Hypothyroidism - Suspect due to amio. Remains on low dose amio. May need to stop.  - Continue synthroid.  - Although TSH high. T4 is only mildly reduced.  9. Hypokalemia - Gently supp with AKI.   10. RUE swelling - R>L edema. [place] negative for DVT. + for edema. Slowly improving.   11. Deconditioning - PT recommending SNF. Agree.   Likely to SNF today vs tomorrow. May  need to watch creatinine overnight. Would hold diuretics at least today to allow equilibration.   Will discuss optimal diuretic regimen for home with Dr. Haroldine Laws.   Will schedule close HF follow up week of 12/18/17  HF meds for discharge Lasix 80mg  daily starting 1/13 give extra 40 mg in afternoon for weight 182 or greater  Amiodarone 200 mg daily Eliquis 2.5 mg BID (decreased dose from previous) Toprol XL 50 mg daily (replaces atenolol) Spironolactone 12.5 mg daily (new) Atorva 80   Stop: diltiazem   Length of Stay: [place],  PA-C  12/08/2017, 7:35 AM  Advanced Heart Failure Team Pager [phone] (M-F; 7a - 4p)  Please contact [facility] for night-coverage after hours (4p -7a ) and weekends on [email or web address]   Patient seen and examined with the above-signed Advanced Practice Provider and/or Housestaff. I personally reviewed laboratory data, imaging studies and relevant notes. I independently examined the patient and formulated the important aspects of the plan. I have edited the note to reflect any of my changes or salient points. I have personally discussed the plan with the patient and/or family.  Volume status much improved almost nearly 30 pound diuresis. Maintaining NSR. Creatinine now up in setting of overdiuresis. Will hold diuretics today and restart po diuretics in on 1/13. Ok to go today or tomorrow with close f/u on renal function. We will see in clinic soon. She also follows with Dr. Wynonia Lawman and I will discuss management with him as well. Will need to consider CRT when foot lesion heals.   Glori Bickers, MD  8:49 AM

## 2017-12-10 DIAGNOSIS — I4891 Unspecified atrial fibrillation: Secondary | ICD-10-CM | POA: Diagnosis not present

## 2017-12-10 DIAGNOSIS — H9190 Unspecified hearing loss, unspecified ear: Secondary | ICD-10-CM | POA: Diagnosis not present

## 2017-12-10 DIAGNOSIS — E039 Hypothyroidism, unspecified: Secondary | ICD-10-CM | POA: Diagnosis not present

## 2017-12-10 DIAGNOSIS — R609 Edema, unspecified: Secondary | ICD-10-CM | POA: Diagnosis not present

## 2017-12-10 DIAGNOSIS — I5020 Unspecified systolic (congestive) heart failure: Secondary | ICD-10-CM | POA: Diagnosis not present

## 2017-12-12 DIAGNOSIS — S91301D Unspecified open wound, right foot, subsequent encounter: Secondary | ICD-10-CM | POA: Diagnosis not present

## 2017-12-12 DIAGNOSIS — S91301A Unspecified open wound, right foot, initial encounter: Secondary | ICD-10-CM | POA: Diagnosis not present

## 2017-12-14 DIAGNOSIS — D631 Anemia in chronic kidney disease: Secondary | ICD-10-CM | POA: Diagnosis present

## 2017-12-14 DIAGNOSIS — N189 Chronic kidney disease, unspecified: Secondary | ICD-10-CM

## 2017-12-19 DIAGNOSIS — S91301D Unspecified open wound, right foot, subsequent encounter: Secondary | ICD-10-CM | POA: Diagnosis not present

## 2017-12-19 DIAGNOSIS — S91301A Unspecified open wound, right foot, initial encounter: Secondary | ICD-10-CM | POA: Diagnosis not present

## 2017-12-20 ENCOUNTER — Encounter (HOSPITAL_COMMUNITY): Payer: Self-pay

## 2017-12-20 ENCOUNTER — Ambulatory Visit (HOSPITAL_COMMUNITY)
Admit: 2017-12-20 | Discharge: 2017-12-20 | Disposition: A | Discharge status: Home / Self Care | Payer: No Typology Code available for payment source | Attending: Cardiology | Admitting: Cardiology

## 2017-12-20 VITALS — BP 124/58 | HR 70 | Wt 174.6 lb

## 2017-12-20 DIAGNOSIS — Z955 Presence of coronary angioplasty implant and graft: Secondary | ICD-10-CM | POA: Insufficient documentation

## 2017-12-20 DIAGNOSIS — I447 Left bundle-branch block, unspecified: Secondary | ICD-10-CM | POA: Diagnosis not present

## 2017-12-20 DIAGNOSIS — I119 Hypertensive heart disease without heart failure: Secondary | ICD-10-CM | POA: Diagnosis not present

## 2017-12-20 DIAGNOSIS — Z880 Allergy status to penicillin: Secondary | ICD-10-CM | POA: Insufficient documentation

## 2017-12-20 DIAGNOSIS — I2510 Atherosclerotic heart disease of native coronary artery without angina pectoris: Secondary | ICD-10-CM | POA: Diagnosis not present

## 2017-12-20 DIAGNOSIS — I4819 Other persistent atrial fibrillation: Secondary | ICD-10-CM

## 2017-12-20 DIAGNOSIS — E669 Obesity, unspecified: Secondary | ICD-10-CM | POA: Diagnosis not present

## 2017-12-20 DIAGNOSIS — Z7901 Long term (current) use of anticoagulants: Secondary | ICD-10-CM | POA: Insufficient documentation

## 2017-12-20 DIAGNOSIS — E039 Hypothyroidism, unspecified: Secondary | ICD-10-CM | POA: Diagnosis not present

## 2017-12-20 DIAGNOSIS — K449 Diaphragmatic hernia without obstruction or gangrene: Secondary | ICD-10-CM | POA: Insufficient documentation

## 2017-12-20 DIAGNOSIS — Z9981 Dependence on supplemental oxygen: Secondary | ICD-10-CM | POA: Insufficient documentation

## 2017-12-20 DIAGNOSIS — N179 Acute kidney failure, unspecified: Secondary | ICD-10-CM | POA: Insufficient documentation

## 2017-12-20 DIAGNOSIS — I5022 Chronic systolic (congestive) heart failure: Secondary | ICD-10-CM | POA: Diagnosis not present

## 2017-12-20 DIAGNOSIS — N183 Chronic kidney disease, stage 3 unspecified: Secondary | ICD-10-CM

## 2017-12-20 DIAGNOSIS — Z6830 Body mass index (BMI) 30.0-30.9, adult: Secondary | ICD-10-CM | POA: Insufficient documentation

## 2017-12-20 DIAGNOSIS — E876 Hypokalemia: Secondary | ICD-10-CM | POA: Insufficient documentation

## 2017-12-20 DIAGNOSIS — I5023 Acute on chronic systolic (congestive) heart failure: Secondary | ICD-10-CM | POA: Insufficient documentation

## 2017-12-20 DIAGNOSIS — G4730 Sleep apnea, unspecified: Secondary | ICD-10-CM

## 2017-12-20 DIAGNOSIS — I481 Persistent atrial fibrillation: Secondary | ICD-10-CM | POA: Insufficient documentation

## 2017-12-20 DIAGNOSIS — E785 Hyperlipidemia, unspecified: Secondary | ICD-10-CM | POA: Insufficient documentation

## 2017-12-20 DIAGNOSIS — F419 Anxiety disorder, unspecified: Secondary | ICD-10-CM | POA: Insufficient documentation

## 2017-12-20 DIAGNOSIS — Z79899 Other long term (current) drug therapy: Secondary | ICD-10-CM | POA: Insufficient documentation

## 2017-12-20 DIAGNOSIS — K219 Gastro-esophageal reflux disease without esophagitis: Secondary | ICD-10-CM | POA: Insufficient documentation

## 2017-12-20 DIAGNOSIS — I130 Hypertensive heart and chronic kidney disease with heart failure and stage 1 through stage 4 chronic kidney disease, or unspecified chronic kidney disease: Secondary | ICD-10-CM | POA: Insufficient documentation

## 2017-12-20 DIAGNOSIS — F411 Generalized anxiety disorder: Secondary | ICD-10-CM

## 2017-12-20 LAB — BASIC METABOLIC PANEL WITH GFR
Anion gap: 12 (ref 5–15)
BUN: 28 mg/dL — ABNORMAL HIGH (ref 6–20)
CO2: 27 mmol/L (ref 22–32)
Calcium: 9.1 mg/dL (ref 8.9–10.3)
Chloride: 97 mmol/L — ABNORMAL LOW (ref 101–111)
Creatinine, Ser: 1.37 mg/dL — ABNORMAL HIGH (ref 0.44–1.00)
GFR calc Af Amer: 40 mL/min — ABNORMAL LOW
GFR calc non Af Amer: 35 mL/min — ABNORMAL LOW
Glucose, Bld: 129 mg/dL — ABNORMAL HIGH (ref 65–99)
Potassium: 4.3 mmol/L (ref 3.5–5.1)
Sodium: 136 mmol/L (ref 135–145)

## 2017-12-20 NOTE — Progress Notes (Signed)
Advanced Heart Failure Clinic Note   Primary Cardiologist: Dr. Wynonia Lawman HF: Dr. Haroldine Laws   HPI:  [name] is a 84 y.o. female with h/o HTN, HL, CKD 3, CAD s/p PCI to LAD in 2003, PAH, hypothyroidism, and systolic CHF.  Initially followed by Dr. Rex Kras. Had PCI of LAD in 2003. F/u cath in 2012 with patent LAD stent with 40% diagonal lesion. Otherwise normal coronaries. She was subsequently seen by Dr. Einar Gip before switching to Dr. Wynonia Lawman in 2015.  She presented to Dr. Wynonia Lawman in 4/18  and found to be in new onset AF with HF. Started on amio with plans for DC-CV. However she did not f/u. She was admitted in was readmitted in July and found to be profoundly hypothyroid as well as with rapid atrial fibrillation. She was diuresed during that admission and was feeling much better. Over the past 6 months she reports that she has grown increasingly weak and now unable to leave the house and grandchildren are doing most of her errands for her.   Echo 11/25/2017 LVEF 40-45% (Thought closer to 35-40% by Dr. Haroldine Laws) with moderate RV dysfunction and severe MR with dyssynchrony due to LBBB.   Admitted 12/28 -> 12/08/17 with Acute on chronic systolic CHF complicated by Afib. Underwent successful DCCV 12/05/17. Hypothyroidism thought stable enough for sedation. Diuresed with improved symptoms. Hospital course complicated by mild AKI with creatinine peak at 2.0 Planned for close follow up with labwork. Discharged to SNF. Discharge weight 176 lbs.  She presents today for post hospital follow up. Overall feeling better, but per pt and son not "bouncing back" like she usually does. She remains in [facility]. Walked ~600 feet yesterday with PT. Took 3-4 breaks, but mostly due to fatigue, not SOB.  Feels like she still has some edema in her legs, but breathing has been "OK". Taking all medications (provided by SNF).  She very occasionally gets lightheadedness with rapid standing. None today, and not every day.  It has not been marked or limiting. She gets occasional tension type headaches.   Review of systems complete and found to be negative unless listed in HPI.    Past Medical History:  Diagnosis Date  . Anxiety    "some; not treated for it" (11/27/2014)  . Bradycardia   . CAD (coronary artery disease), native coronary artery    2003 normal left main, 80% mid LAD  2.5 x 13 mm Cypher stent to LAD Dr. Rex Kras, subsequent cath 2004 showed patent stent in LAD with no sig disease in RCA or Circ;    . CHF (congestive heart failure) ([place])   . CKD (chronic kidney disease), stage III ([place])   . GERD (gastroesophageal reflux disease)   . History of hiatal hernia   . Hyperlipidemia   . Hypertension   . LBBB (left bundle branch block)   . Migraine    "very rare now" (11/27/2014)  . Obesity (BMI 30-39.9) 06/18/2017  . On home oxygen therapy    "only at night; ? setting" (11/27/2014)  . Osteoarthritis cervical spine   . Sleep apnea 06/18/2017    Current Outpatient Medications  Medication Sig Dispense Refill  . acetaminophen (TYLENOL) 325 MG tablet Take 2 tablets (650 mg total) by mouth every 6 (six) hours as needed for mild pain (or Fever >/= 101). 30 tablet 1  . amiodarone (PACERONE) 200 MG tablet Take 1 tablet (200 mg total) by mouth daily. 30 tablet 0  . apixaban (ELIQUIS) 2.5 MG TABS tablet  Take 1 tablet (2.5 mg total) by mouth 2 (two) times daily. 60 tablet 0  . atorvastatin (LIPITOR) 80 MG tablet Take 1 tablet (80 mg total) by mouth daily. 30 tablet 0  . butalbital-acetaminophen-caffeine (FIORICET, ESGIC) 50-325-40 MG tablet Take 1 tablet by mouth every 4 (four) hours as needed (for headaches).     . furosemide (LASIX) 40 MG tablet Take 2 tablets (80 mg total) by mouth daily. Please take an additional 40 mg (1 tablet) in the evening if your weight goes higher than 182 lbs 60 tablet 0  . levothyroxine (SYNTHROID, LEVOTHROID) 200 MCG tablet Take 1 tablet (200 mcg total) by mouth daily before  breakfast. 30 tablet 0  . metoprolol succinate (TOPROL-XL) 50 MG 24 hr tablet Take 1 tablet (50 mg total) by mouth daily. Take with or immediately following a meal. 30 tablet 0  . omeprazole (PRILOSEC OTC) 20 MG tablet Take 20 mg by mouth daily as needed (for reflux).    . polyethylene glycol (MIRALAX / GLYCOLAX) packet Take 17 g by mouth daily. 14 each 0  . senna-docusate (SENOKOT-S) 8.6-50 MG tablet Take 1 tablet by mouth at bedtime. 30 tablet 0  . spironolactone (ALDACTONE) 25 MG tablet Take 0.5 tablets (12.5 mg total) by mouth daily. 30 tablet 0   No current facility-administered medications for this encounter.     Allergies  Allergen Reactions  . Ampicillin Shortness Of Breath, Swelling and Rash  . Hydromorphone Hcl Shortness Of Breath, Itching, Swelling and Rash  . Penicillins Anaphylaxis    Has patient had a PCN reaction causing immediate rash, facial/tongue/throat swelling, SOB or lightheadedness with hypotension: Yes Has patient had a PCN reaction causing severe rash involving mucus membranes or skin necrosis: Unknown Has patient had a PCN reaction that required hospitalization: Yes (was in the hosp) Has patient had a PCN reaction occurring within the last 10 years: No If all of the above answers are "NO", then may proceed with Cephalosporin use.      Social History   Socioeconomic History  . Marital status: Divorced    Spouse name: Not on file  . Number of children: 3  . Years of education: college  . Highest education level: Not on file  Social Needs  . Financial resource strain: Not on file  . Food insecurity - worry: Not on file  . Food insecurity - inability: Not on file  . Transportation needs - medical: Not on file  . Transportation needs - non-medical: Not on file  Occupational History  . Occupation: Retired   Tobacco Use  . Smoking status: Never Smoker  . Smokeless tobacco: Never Used  Substance and Sexual Activity  . Alcohol use: No    Alcohol/week: 0.0  oz  . Drug use: No  . Sexual activity: No  Other Topics Concern  . Not on file  Social History Narrative   Drinks tea, occasional coffee     Family History  Problem Relation Age of Onset  . Heart attack Mother   . Hypertension Mother   . Heart Problems Mother   . Heart attack Father   . Hypertension Father     Vitals:   12/20/17 1337  BP: (!) 124/58  Pulse: 70  Weight: 174 lb 9.6 oz (79.2 kg)   Wt Readings from Last 3 Encounters:  12/20/17 174 lb 9.6 oz (79.2 kg)  12/08/17 176 lb 14.4 oz (80.2 kg)  06/21/17 187 lb 9.6 oz (85.1 kg)   PHYSICAL EXAM: General:  Elderly appearing. Hard of hearing.  HEENT: normal Neck: Supple. JVP 7-8 cm. Carotids 2+ bilat; no bruits. No lymphadenopathy or thyromegaly appreciated. Cor: PMI nondisplaced. Regular rate & rhythm. No rubs, gallops or murmurs. Lungs: clear Abdomen: soft, nontender, nondistended. No hepatosplenomegaly. No bruits or masses. Good bowel sounds. Extremities: no cyanosis, clubbing, rash, edema Neuro: alert & oriented x 3, cranial nerves grossly intact. moves all 4 extremities w/o difficulty. Affect pleasant.  ECG: NSR 71 bpm with LBBB QRS 166 ms  ASSESSMENT & PLAN:  1. Chronic systolic HF with biventricualr failure - Echo 11/25/18  Appears to have EF 35-40% with moderate RV dysfunction and severe MR with dyssynchrony due to LBBB. (EF 45-50% in 7/18)  - Suspect EF down due to AF and dyssynchrony from LBBB. ? Component of myxedema. - Volume status stable to dry on exam. Complicated by RV failure.  - ReDs vest 22% indicating on the dry side.  - Continue lasix 80 mg daily. BMET today. If creatinine bump will need to decrease lasix.  - Continue spiro 12.5 mg daily - Continue toprol 50 mg daily. Watch HR closely. - No ACE/ARB for now with AKI.  - Can consider CRT once foot wound heals (not candidate for implanted device with open wound) - Place UNNA boots for Lymphedema/Chronic venous stasis.  - Reinforced fluid  restriction to < 2 L daily, sodium restriction to less than 2000 mg daily, and the importance of daily weights.  2. Persistent AF - NSR/sinus brady s/p DCCV 12/05/17 - EKG today shows NSR. - Continue amiodarone 200 mg daily.  - Continue Eliquis 2.5 mg BID. Denies bleeding.   3. LBBB - See discussion above. Suspect dyssynchrony playing a role in LV dysfunction. Consider CRT once foot wound heals.  - EKG today shows NSR 71 bpm with QRS 166 bpm - Refer to EP for CRT consideration.   4. Severe mitral regurgitation - Secondary to LV dilation. Not candidate for MV clip. Suspect it will improve with improvement in HF.  - No change.   5. CAD s/p LAD stent 2003 - No s/s of ischemia.   . 2012 with patent stent and otherwise minimal CAD - Continue statin. Off ASA with Eliquis  6. Foot wound - Improving per son and patient. SNF following, was re-dressed this am.   7. CKD III - Baseline 1.3 - 1.5 range. - up to 2.0 on discharge. BMET today.   8. Hypothyroidism - Suspect due to amio. - Continue to follow on low dose amio. - Continue synthroid.  - Although TSH high. T4 is only mildly reduced.  9. Hypokalemia - Gently supp with AKI.   10. HTN - Stable on meds as above.   11. Deconditioning - Remains at SNF.  BMET today. May need to cut back lasix if creatinine remains elevated. Refer to EP for CRT consideration with long LBBB.   Shirley Friar, PA-C 12/20/17   Patient seen and examined with the above-signed Advanced Practice Provider and/or Housestaff. I personally reviewed laboratory data, imaging studies and relevant notes. I independently examined the patient and formulated the important aspects of the plan. I have edited the note to reflect any of my changes or salient points. I have personally discussed the plan with the patient and/or family.  She is doing fairly well after her prolonged hospital stay for HF and AF. She is still at [facility]. Maintaining NSR. Fatigue  mildly improved. Denies orthopnea or PND but struggling with lower extremity edema. L>R. REDs vest  22% suggestive of low lung water. Thus suspect LE edema likely venous insufficiency. Will wrap LEs. Will continue to follow and see how she improves with NSR. Given wide LBBB and evidence of dyssynchrony on echo may benefit from CRT. Will refer to EP.   Total time spent 35 minutes. Over half that time spent discussing above.   Glori Bickers, MD  5:46 PM

## 2017-12-20 NOTE — Patient Instructions (Signed)
Routine lab work today. Will notify you of abnormal results, otherwise no news is good news!  Will order unna boot to left lower leg for lymphedema.  Will refer you to electrophysiology at [facility]. Address: [address] ([place]), [place], [place] [address]  Phone: [phone] Their office will contact you by phone to schedule.  Follow up 4 weeks.  Take all medication as prescribed the day of your appointment. Bring all medications with you to your appointment.  Do the following things EVERYDAY: 1) Weigh yourself in the morning before breakfast. Write it down and keep it in a log. 2) Take your medicines as prescribed 3) Eat low salt foods--Limit salt (sodium) to 2000 mg per day.  4) Stay as active as you can everyday 5) Limit all fluids for the day to less than 2 liters

## 2017-12-25 DIAGNOSIS — E039 Hypothyroidism, unspecified: Secondary | ICD-10-CM | POA: Diagnosis not present

## 2017-12-25 DIAGNOSIS — E669 Obesity, unspecified: Secondary | ICD-10-CM | POA: Diagnosis not present

## 2017-12-25 DIAGNOSIS — G4733 Obstructive sleep apnea (adult) (pediatric): Secondary | ICD-10-CM | POA: Diagnosis not present

## 2017-12-25 DIAGNOSIS — Z7901 Long term (current) use of anticoagulants: Secondary | ICD-10-CM | POA: Diagnosis not present

## 2017-12-25 DIAGNOSIS — I130 Hypertensive heart and chronic kidney disease with heart failure and stage 1 through stage 4 chronic kidney disease, or unspecified chronic kidney disease: Secondary | ICD-10-CM | POA: Diagnosis not present

## 2017-12-25 DIAGNOSIS — I481 Persistent atrial fibrillation: Secondary | ICD-10-CM | POA: Diagnosis not present

## 2017-12-25 DIAGNOSIS — I2510 Atherosclerotic heart disease of native coronary artery without angina pectoris: Secondary | ICD-10-CM | POA: Diagnosis not present

## 2017-12-25 DIAGNOSIS — K219 Gastro-esophageal reflux disease without esophagitis: Secondary | ICD-10-CM | POA: Diagnosis not present

## 2017-12-25 DIAGNOSIS — I5023 Acute on chronic systolic (congestive) heart failure: Secondary | ICD-10-CM | POA: Diagnosis not present

## 2017-12-25 DIAGNOSIS — N183 Chronic kidney disease, stage 3 (moderate): Secondary | ICD-10-CM | POA: Diagnosis not present

## 2017-12-29 DIAGNOSIS — E039 Hypothyroidism, unspecified: Secondary | ICD-10-CM | POA: Diagnosis not present

## 2017-12-29 DIAGNOSIS — I481 Persistent atrial fibrillation: Secondary | ICD-10-CM | POA: Diagnosis not present

## 2017-12-29 DIAGNOSIS — I2510 Atherosclerotic heart disease of native coronary artery without angina pectoris: Secondary | ICD-10-CM | POA: Diagnosis not present

## 2017-12-29 DIAGNOSIS — I5023 Acute on chronic systolic (congestive) heart failure: Secondary | ICD-10-CM | POA: Diagnosis not present

## 2017-12-29 DIAGNOSIS — I130 Hypertensive heart and chronic kidney disease with heart failure and stage 1 through stage 4 chronic kidney disease, or unspecified chronic kidney disease: Secondary | ICD-10-CM | POA: Diagnosis not present

## 2017-12-29 DIAGNOSIS — N183 Chronic kidney disease, stage 3 (moderate): Secondary | ICD-10-CM | POA: Diagnosis not present

## 2018-01-01 DIAGNOSIS — E039 Hypothyroidism, unspecified: Secondary | ICD-10-CM | POA: Diagnosis not present

## 2018-01-01 DIAGNOSIS — J9600 Acute respiratory failure, unspecified whether with hypoxia or hypercapnia: Secondary | ICD-10-CM | POA: Diagnosis not present

## 2018-01-01 DIAGNOSIS — Z6828 Body mass index (BMI) 28.0-28.9, adult: Secondary | ICD-10-CM | POA: Diagnosis not present

## 2018-01-01 DIAGNOSIS — Z79899 Other long term (current) drug therapy: Secondary | ICD-10-CM | POA: Diagnosis not present

## 2018-01-01 DIAGNOSIS — I509 Heart failure, unspecified: Secondary | ICD-10-CM | POA: Diagnosis not present

## 2018-01-02 DIAGNOSIS — I5023 Acute on chronic systolic (congestive) heart failure: Secondary | ICD-10-CM | POA: Diagnosis not present

## 2018-01-02 DIAGNOSIS — N183 Chronic kidney disease, stage 3 (moderate): Secondary | ICD-10-CM | POA: Diagnosis not present

## 2018-01-02 DIAGNOSIS — I481 Persistent atrial fibrillation: Secondary | ICD-10-CM | POA: Diagnosis not present

## 2018-01-02 DIAGNOSIS — I130 Hypertensive heart and chronic kidney disease with heart failure and stage 1 through stage 4 chronic kidney disease, or unspecified chronic kidney disease: Secondary | ICD-10-CM | POA: Diagnosis not present

## 2018-01-02 DIAGNOSIS — I2510 Atherosclerotic heart disease of native coronary artery without angina pectoris: Secondary | ICD-10-CM | POA: Diagnosis not present

## 2018-01-02 DIAGNOSIS — E039 Hypothyroidism, unspecified: Secondary | ICD-10-CM | POA: Diagnosis not present

## 2018-01-04 ENCOUNTER — Institutional Professional Consult (permissible substitution): Payer: No Typology Code available for payment source | Admitting: Internal Medicine

## 2018-01-08 ENCOUNTER — Other Ambulatory Visit (HOSPITAL_COMMUNITY): Payer: Self-pay | Admitting: *Deleted

## 2018-01-08 DIAGNOSIS — I2510 Atherosclerotic heart disease of native coronary artery without angina pectoris: Secondary | ICD-10-CM | POA: Diagnosis not present

## 2018-01-08 DIAGNOSIS — I130 Hypertensive heart and chronic kidney disease with heart failure and stage 1 through stage 4 chronic kidney disease, or unspecified chronic kidney disease: Secondary | ICD-10-CM | POA: Diagnosis not present

## 2018-01-08 DIAGNOSIS — I481 Persistent atrial fibrillation: Secondary | ICD-10-CM | POA: Diagnosis not present

## 2018-01-08 DIAGNOSIS — E039 Hypothyroidism, unspecified: Secondary | ICD-10-CM | POA: Diagnosis not present

## 2018-01-08 DIAGNOSIS — N183 Chronic kidney disease, stage 3 (moderate): Secondary | ICD-10-CM | POA: Diagnosis not present

## 2018-01-08 DIAGNOSIS — I5023 Acute on chronic systolic (congestive) heart failure: Secondary | ICD-10-CM | POA: Diagnosis not present

## 2018-01-08 MED ORDER — FUROSEMIDE 40 MG PO TABS: 80.0000 mg | ORAL_TABLET | Freq: Every day | ORAL | 3 refills | Status: DC

## 2018-01-11 DIAGNOSIS — I481 Persistent atrial fibrillation: Secondary | ICD-10-CM | POA: Diagnosis not present

## 2018-01-11 DIAGNOSIS — I5023 Acute on chronic systolic (congestive) heart failure: Secondary | ICD-10-CM | POA: Diagnosis not present

## 2018-01-11 DIAGNOSIS — E039 Hypothyroidism, unspecified: Secondary | ICD-10-CM | POA: Diagnosis not present

## 2018-01-11 DIAGNOSIS — I130 Hypertensive heart and chronic kidney disease with heart failure and stage 1 through stage 4 chronic kidney disease, or unspecified chronic kidney disease: Secondary | ICD-10-CM | POA: Diagnosis not present

## 2018-01-11 DIAGNOSIS — N183 Chronic kidney disease, stage 3 (moderate): Secondary | ICD-10-CM | POA: Diagnosis not present

## 2018-01-11 DIAGNOSIS — I2510 Atherosclerotic heart disease of native coronary artery without angina pectoris: Secondary | ICD-10-CM | POA: Diagnosis not present

## 2018-01-17 ENCOUNTER — Encounter (HOSPITAL_COMMUNITY): Payer: No Typology Code available for payment source

## 2018-01-17 ENCOUNTER — Other Ambulatory Visit: Payer: Self-pay | Admitting: Internal Medicine

## 2018-01-18 ENCOUNTER — Ambulatory Visit (INDEPENDENT_AMBULATORY_CARE_PROVIDER_SITE_OTHER): Payer: Medicare Other | Admitting: Internal Medicine

## 2018-01-18 ENCOUNTER — Encounter: Payer: Self-pay | Admitting: Internal Medicine

## 2018-01-18 VITALS — BP 110/62 | HR 81 | Ht 64.5 in | Wt 164.8 lb

## 2018-01-18 DIAGNOSIS — I447 Left bundle-branch block, unspecified: Secondary | ICD-10-CM | POA: Diagnosis not present

## 2018-01-18 DIAGNOSIS — I481 Persistent atrial fibrillation: Secondary | ICD-10-CM

## 2018-01-18 DIAGNOSIS — I5022 Chronic systolic (congestive) heart failure: Secondary | ICD-10-CM | POA: Diagnosis not present

## 2018-01-18 DIAGNOSIS — I4819 Other persistent atrial fibrillation: Secondary | ICD-10-CM

## 2018-01-18 NOTE — H&P (View-Only) (Signed)
HPI [name] is referred today by Dr. Reine Just to consider BiV PPM insertion. The patient has chronic systolic heart failure, MR, LBBB, and CAD. The patient has had class 3 symptoms. She denies anginal symptoms. Her EF has varied from 45 to most recently 35%. She also has MR which likely results in her EF being much worse. She has not had syncope. Her QRS duration is 155 ms.  Allergies  Allergen Reactions  . Ampicillin Shortness Of Breath, Swelling and Rash  . Hydromorphone Hcl Shortness Of Breath, Itching, Swelling and Rash  . Penicillins Anaphylaxis    Has patient had a PCN reaction causing immediate rash, facial/tongue/throat swelling, SOB or lightheadedness with hypotension: Yes Has patient had a PCN reaction causing severe rash involving mucus membranes or skin necrosis: Unknown Has patient had a PCN reaction that required hospitalization: Yes (was in the hosp) Has patient had a PCN reaction occurring within the last 10 years: No If all of the above answers are "NO", then may proceed with Cephalosporin use.      Current Outpatient Medications  Medication Sig Dispense Refill  . acetaminophen (TYLENOL) 325 MG tablet Take 2 tablets (650 mg total) by mouth every 6 (six) hours as needed for mild pain (or Fever >/= 101). 30 tablet 1  . amiodarone (PACERONE) 200 MG tablet Take 1 tablet (200 mg total) by mouth daily. 30 tablet 0  . apixaban (ELIQUIS) 2.5 MG TABS tablet Take 1 tablet (2.5 mg total) by mouth 2 (two) times daily. 60 tablet 0  . atorvastatin (LIPITOR) 80 MG tablet Take 1 tablet (80 mg total) by mouth daily. 30 tablet 0  . butalbital-acetaminophen-caffeine (FIORICET, ESGIC) 50-325-40 MG tablet Take 1 tablet by mouth every 4 (four) hours as needed (for headaches).     . furosemide (LASIX) 40 MG tablet Take 2 tablets (80 mg total) by mouth daily. 60 tablet 3  . levothyroxine (SYNTHROID, LEVOTHROID) 200 MCG tablet Take 1 tablet (200 mcg total) by mouth daily before breakfast. 30  tablet 0  . metoprolol succinate (TOPROL-XL) 50 MG 24 hr tablet Take 1 tablet (50 mg total) by mouth daily. Take with or immediately following a meal. 30 tablet 0  . omeprazole (PRILOSEC OTC) 20 MG tablet Take 20 mg by mouth daily as needed (for reflux).    . polyethylene glycol (MIRALAX / GLYCOLAX) packet Take 17 g by mouth daily. 14 each 0  . senna-docusate (SENOKOT-S) 8.6-50 MG tablet Take 1 tablet by mouth at bedtime. 30 tablet 0  . spironolactone (ALDACTONE) 25 MG tablet Take 0.5 tablets (12.5 mg total) by mouth daily. 30 tablet 0   No current facility-administered medications for this visit.      Past Medical History:  Diagnosis Date  . Anxiety    "some; not treated for it" (11/27/2014)  . Bradycardia   . CAD (coronary artery disease), native coronary artery    2003 normal left main, 80% mid LAD  2.5 x 13 mm Cypher stent to LAD Dr. Rex Kras, subsequent cath 2004 showed patent stent in LAD with no sig disease in RCA or Circ;    . CHF (congestive heart failure) ([place])   . CKD (chronic kidney disease), stage III ([place])   . GERD (gastroesophageal reflux disease)   . History of hiatal hernia   . Hyperlipidemia   . Hypertension   . LBBB (left bundle branch block)   . Migraine    "very rare now" (11/27/2014)  . Obesity (BMI 30-39.9)  06/18/2017  . On home oxygen therapy    "only at night; ? setting" (11/27/2014)  . Osteoarthritis cervical spine   . Sleep apnea 06/18/2017    ROS:   All systems reviewed and negative except as noted in the HPI.   Past Surgical History:  Procedure Laterality Date  . ABDOMINAL HYSTERECTOMY    . CARDIOVERSION N/A 12/05/2017   Procedure: CARDIOVERSION;  Surgeon: Jolaine Artist, MD;  Location: [facility] ENDOSCOPY;  Service: Cardiovascular;  Laterality: N/A;  . CATARACT EXTRACTION W/ INTRAOCULAR LENS  IMPLANT, BILATERAL Bilateral   . CYSTOCELE REPAIR       Family History  Problem Relation Age of Onset  . Heart attack Mother   . Hypertension Mother   .  Heart Problems Mother   . Heart attack Father   . Hypertension Father      Social History   Socioeconomic History  . Marital status: Divorced    Spouse name: Not on file  . Number of children: 3  . Years of education: college  . Highest education level: Not on file  Social Needs  . Financial resource strain: Not on file  . Food insecurity - worry: Not on file  . Food insecurity - inability: Not on file  . Transportation needs - medical: Not on file  . Transportation needs - non-medical: Not on file  Occupational History  . Occupation: Retired   Tobacco Use  . Smoking status: Never Smoker  . Smokeless tobacco: Never Used  Substance and Sexual Activity  . Alcohol use: No    Alcohol/week: 0.0 oz  . Drug use: No  . Sexual activity: No  Other Topics Concern  . Not on file  Social History Narrative   Drinks tea, occasional coffee     BP 110/62   Pulse 81   Ht 5' 4.5" (1.638 m)   Wt 164 lb 12.8 oz (74.8 kg)   BMI 27.85 kg/m   Physical Exam:  Stable appearing 84 yo woman, NAD HEENT: Unremarkable Neck:  7 cm JVD, no thyromegally Lymphatics:  No adenopathy Back:  No CVA tenderness Lungs:  Clear except for basilar rales.  HEART:  Regular rate rhythm, no murmurs, no rubs, no clicks, widely split S2. Abd:  soft, positive bowel sounds, no organomegally, no rebound, no guarding Ext:  2 plus pulses, no edema, no cyanosis, no clubbing Skin:  No rashes no nodules Neuro:  CN II through XII intact, motor grossly intact  EKG - nsr with LBBB  DEVICE  Normal device function.  See PaceArt for details.   Assess/Plan: 1. Chronic systolic heart failure - her symptoms are class 3. We discussed insertion of a Biv PPM and she will call us if she wishes to proceed. 2. CAD - She is s/p stenting remotely and denies anginal symptoms.  3. MR - hopefully with biv pacing her MR will improve.   Mikle Bosworth.D.

## 2018-01-18 NOTE — Patient Instructions (Addendum)
Medication Instructions:  Your physician recommends that you continue on your current medications as directed. Please refer to the Current Medication list given to you today.  Labwork: You will get lab work today:  BMP and CBC.  Testing/Procedures: Your physician has recommended that you have a pacemaker inserted. A pacemaker is a small device that is placed under the skin of your chest or abdomen to help control abnormal heart rhythms. This device uses electrical pulses to prompt the heart to beat at a normal rate. Pacemakers are used to treat heart rhythms that are too slow. Wire (leads) are attached to the pacemaker that goes into the chambers of you heart. This is done in the hospital and usually requires and overnight stay. Please see the instruction sheet given to you today for more information.  Follow-Up: You will follow up with device clinic 10-14 days after your procedure for a wound check.  You will follow up with Dr. Lovena Le 91 days after your procedure.  Any Other Special Instructions Will Be Listed Below (If Applicable).  Please arrive at the [facility] main entrance of [place] [facility] at:  7:30 am on January 29, 2018.  Use the CHG surgical scrub as directed  Do not eat or drink after midnight prior to procedure  Do not take your ELIQUIS for 2 days prior to your procedure.  Your last dose will be January 26, 2018 your evening dose. On the morning of your procedure you may take your morning medications except for:  Eliquis, Lasix and Spironolactone.  Use enough water to safely swallow your pills.  Plan for one night stay.  You will need someone to drive you home at discharge.  If you need a refill on your cardiac medications before your next appointment, please call your pharmacy.   Biventricular Pacemaker Implantation A biventricular pacemaker implantation is a procedure to place (implant) a pacemaker into both of the lower chambers (ventricles) of the heart. A pacemaker is  a small, battery-powered device that helps control the heartbeat. If the heart beats irregularly or too slowly (bradycardia), the pacemaker will pace the heart so that it beats at a normal rate or a programmed rate. The parts of a biventricular pacemaker include:  The pulse generator. The pulse generator contains a small computer and a memory system that is programmed to keep the heart beating at a certain rate. The pulse generator also produces the electrical signal that triggers the heart to beat. This is implanted under the skin of the upper chest, near the collarbone.  Wires (leads). The leads are placed in the left and right ventricles of the heart. The leads are connected to the pulse generator. They transmit electrical pulses from the pulse generator to the heart.  This procedure may be done to treat:  Bradycardia.  Symptoms of severe heart failure, such as shortness of breath (dyspnea).  Loss of consciousness that happens repeatedly (syncope) because of an irregular heart rate.  Tell a health care provider about:  Any allergies you have.  All medicines you are taking, including vitamins, herbs, eye drops, creams, and over-the-counter medicines.  Any problems you or family members have had with anesthetic medicines.  Any blood disorders you have.  Any surgeries you have had.  Any medical conditions you have.  Whether you are pregnant or may be pregnant. What are the risks? Generally, this is a safe procedure. However, problems may occur, including:  Infection.  Bleeding.  Allergic reactions to medicines or dyes.  Damage to other structures or organs, such as your blood vessels, lungs, or heart.  Failure of the pacemaker to improve your condition.  What happens before the procedure?  Ask your health care provider about: ? Changing or stopping your regular medicines. This is especially important if you are taking diabetes medicines or blood thinners. ? Taking  medicines such as aspirin and ibuprofen. These medicines can thin your blood. Do not take these medicines before your procedure if your health care provider instructs you not to.  Follow instructions from your health care provider about eating or drinking restrictions.  Do not use any tobacco products for at least 24 hours before your procedure. This includes cigarettes, chewing tobacco, or e-cigarettes.  Ask your health care provider how your surgical site will be marked or identified.  You may be given antibiotic medicine to help prevent infection.  You may have tests, including: ? Blood tests. ? Chest X-rays.  Plan to have someone take you home after the procedure.  If you go home right after the procedure, plan to have someone with you for 24 hours. What happens during the procedure?  To reduce your risk of infection: ? Your health care team will wash or sanitize their hands. ? Your skin will be washed with soap. ? Hair may be removed from your surgical area.  An IV tube will be inserted into one of your veins.  You will be given one or more of the following: ? A medicine to help you relax (sedative). ? A medicine to make you fall asleep (general anesthetic). ? A medicine that is injected into your spine to numb the area below and slightly above the injection site (spinal anesthetic). ? A medicine that is injected into an area of your body to numb everything below the injection site (regional anesthetic).  An incision will be made in your upper chest, near your heart.  The leads will be guided into your incision, through your blood vessels, and into your ventricles. Your surgeon will use an X-ray machine (fluoroscope) to guide the leads into your heart.  The leads will be attached to your heart muscles and to the pulse generator.  The leads will be tested to make sure that they work correctly.  The pulse generator will be implanted under your skin, near your  incision.  Your incision will be closed with stitches (sutures), skin glue, or adhesive tape.  A bandage (dressing) will be placed over your incision. The procedure may vary among health care providers and hospitals. What happens after the procedure?  Your blood pressure, heart rate, breathing rate, and blood oxygen level will be monitored often until the medicines you were given have worn off.  You may continue to receive fluids and medicines through an IV tube.  You will have some pain. Pain medicines will be available to help you.  You will have a chest X-ray done. This is to make sure that your pacemaker is in the right place.  You may have to wear compression stockings. These stockings help to prevent blood clots and reduce swelling in your legs.  You will be given a pacemaker identification card. This card lists the implant date, device model, and manufacturer of your pacemaker.  Do not drive for 24 hours if you received a sedative. This information is not intended to replace advice given to you by your health care provider. Make sure you discuss any questions you have with your health care provider. Document Released: 08/08/2012  Document Revised: 04/21/2016 Document Reviewed: 08/09/2015 Elsevier Interactive Patient Education  [employer].

## 2018-01-18 NOTE — Progress Notes (Signed)
HPI [name] is referred today by Dr. Reine Just to consider BiV PPM insertion. The patient has chronic systolic heart failure, MR, LBBB, and CAD. The patient has had class 3 symptoms. She denies anginal symptoms. Her EF has varied from 45 to most recently 35%. She also has MR which likely results in her EF being much worse. She has not had syncope. Her QRS duration is 155 ms.  Allergies  Allergen Reactions  . Ampicillin Shortness Of Breath, Swelling and Rash  . Hydromorphone Hcl Shortness Of Breath, Itching, Swelling and Rash  . Penicillins Anaphylaxis    Has patient had a PCN reaction causing immediate rash, facial/tongue/throat swelling, SOB or lightheadedness with hypotension: Yes Has patient had a PCN reaction causing severe rash involving mucus membranes or skin necrosis: Unknown Has patient had a PCN reaction that required hospitalization: Yes (was in the hosp) Has patient had a PCN reaction occurring within the last 10 years: No If all of the above answers are "NO", then may proceed with Cephalosporin use.      Current Outpatient Medications  Medication Sig Dispense Refill  . acetaminophen (TYLENOL) 325 MG tablet Take 2 tablets (650 mg total) by mouth every 6 (six) hours as needed for mild pain (or Fever >/= 101). 30 tablet 1  . amiodarone (PACERONE) 200 MG tablet Take 1 tablet (200 mg total) by mouth daily. 30 tablet 0  . apixaban (ELIQUIS) 2.5 MG TABS tablet Take 1 tablet (2.5 mg total) by mouth 2 (two) times daily. 60 tablet 0  . atorvastatin (LIPITOR) 80 MG tablet Take 1 tablet (80 mg total) by mouth daily. 30 tablet 0  . butalbital-acetaminophen-caffeine (FIORICET, ESGIC) 50-325-40 MG tablet Take 1 tablet by mouth every 4 (four) hours as needed (for headaches).     . furosemide (LASIX) 40 MG tablet Take 2 tablets (80 mg total) by mouth daily. 60 tablet 3  . levothyroxine (SYNTHROID, LEVOTHROID) 200 MCG tablet Take 1 tablet (200 mcg total) by mouth daily before breakfast. 30  tablet 0  . metoprolol succinate (TOPROL-XL) 50 MG 24 hr tablet Take 1 tablet (50 mg total) by mouth daily. Take with or immediately following a meal. 30 tablet 0  . omeprazole (PRILOSEC OTC) 20 MG tablet Take 20 mg by mouth daily as needed (for reflux).    . polyethylene glycol (MIRALAX / GLYCOLAX) packet Take 17 g by mouth daily. 14 each 0  . senna-docusate (SENOKOT-S) 8.6-50 MG tablet Take 1 tablet by mouth at bedtime. 30 tablet 0  . spironolactone (ALDACTONE) 25 MG tablet Take 0.5 tablets (12.5 mg total) by mouth daily. 30 tablet 0   No current facility-administered medications for this visit.      Past Medical History:  Diagnosis Date  . Anxiety    "some; not treated for it" (11/27/2014)  . Bradycardia   . CAD (coronary artery disease), native coronary artery    2003 normal left main, 80% mid LAD  2.5 x 13 mm Cypher stent to LAD Dr. Rex Kras, subsequent cath 2004 showed patent stent in LAD with no sig disease in RCA or Circ;    . CHF (congestive heart failure) ([place])   . CKD (chronic kidney disease), stage III ([place])   . GERD (gastroesophageal reflux disease)   . History of hiatal hernia   . Hyperlipidemia   . Hypertension   . LBBB (left bundle branch block)   . Migraine    "very rare now" (11/27/2014)  . Obesity (BMI 30-39.9)  06/18/2017  . On home oxygen therapy    "only at night; ? setting" (11/27/2014)  . Osteoarthritis cervical spine   . Sleep apnea 06/18/2017    ROS:   All systems reviewed and negative except as noted in the HPI.   Past Surgical History:  Procedure Laterality Date  . ABDOMINAL HYSTERECTOMY    . CARDIOVERSION N/A 12/05/2017   Procedure: CARDIOVERSION;  Surgeon: Jolaine Artist, MD;  Location: [facility] ENDOSCOPY;  Service: Cardiovascular;  Laterality: N/A;  . CATARACT EXTRACTION W/ INTRAOCULAR LENS  IMPLANT, BILATERAL Bilateral   . CYSTOCELE REPAIR       Family History  Problem Relation Age of Onset  . Heart attack Mother   . Hypertension Mother   .  Heart Problems Mother   . Heart attack Father   . Hypertension Father      Social History   Socioeconomic History  . Marital status: Divorced    Spouse name: Not on file  . Number of children: 3  . Years of education: college  . Highest education level: Not on file  Social Needs  . Financial resource strain: Not on file  . Food insecurity - worry: Not on file  . Food insecurity - inability: Not on file  . Transportation needs - medical: Not on file  . Transportation needs - non-medical: Not on file  Occupational History  . Occupation: Retired   Tobacco Use  . Smoking status: Never Smoker  . Smokeless tobacco: Never Used  Substance and Sexual Activity  . Alcohol use: No    Alcohol/week: 0.0 oz  . Drug use: No  . Sexual activity: No  Other Topics Concern  . Not on file  Social History Narrative   Drinks tea, occasional coffee     BP 110/62   Pulse 81   Ht 5' 4.5" (1.638 m)   Wt 164 lb 12.8 oz (74.8 kg)   BMI 27.85 kg/m   Physical Exam:  Stable appearing 84 yo woman, NAD HEENT: Unremarkable Neck:  7 cm JVD, no thyromegally Lymphatics:  No adenopathy Back:  No CVA tenderness Lungs:  Clear except for basilar rales.  HEART:  Regular rate rhythm, no murmurs, no rubs, no clicks, widely split S2. Abd:  soft, positive bowel sounds, no organomegally, no rebound, no guarding Ext:  2 plus pulses, no edema, no cyanosis, no clubbing Skin:  No rashes no nodules Neuro:  CN II through XII intact, motor grossly intact  EKG - nsr with LBBB  DEVICE  Normal device function.  See PaceArt for details.   Assess/Plan: 1. Chronic systolic heart failure - her symptoms are class 3. We discussed insertion of a Biv PPM and she will call us if she wishes to proceed. 2. CAD - She is s/p stenting remotely and denies anginal symptoms.  3. MR - hopefully with biv pacing her MR will improve.   Mikle Bosworth.D.

## 2018-01-19 LAB — BASIC METABOLIC PANEL
BUN/Creatinine Ratio: 19 (ref 12–28)
BUN: 25 mg/dL (ref 8–27)
CALCIUM: 10.1 mg/dL (ref 8.7–10.3)
CO2: 23 mmol/L (ref 20–29)
CREATININE: 1.33 mg/dL — AB (ref 0.57–1.00)
Chloride: 95 mmol/L — ABNORMAL LOW (ref 96–106)
GFR calc Af Amer: 42 mL/min/{1.73_m2} — ABNORMAL LOW (ref >59)
GFR, EST NON AFRICAN AMERICAN: 37 mL/min/{1.73_m2} — AB (ref >59)
Glucose: 131 mg/dL — ABNORMAL HIGH (ref 65–99)
Potassium: 4.1 mmol/L (ref 3.5–5.2)
Sodium: 139 mmol/L (ref 134–144)

## 2018-01-19 LAB — CBC WITH DIFFERENTIAL/PLATELET
BASOS ABS: 0.1 10*3/uL (ref 0.0–0.2)
BASOS: 1 %
EOS (ABSOLUTE): 0.2 10*3/uL (ref 0.0–0.4)
Eos: 1 %
HEMATOCRIT: 36.1 % (ref 34.0–46.6)
Hemoglobin: 11.8 g/dL (ref 11.1–15.9)
IMMATURE GRANS (ABS): 0 10*3/uL (ref 0.0–0.1)
IMMATURE GRANULOCYTES: 0 %
LYMPHS: 32 %
Lymphocytes Absolute: 3.8 10*3/uL — ABNORMAL HIGH (ref 0.7–3.1)
MCH: 27.1 pg (ref 26.6–33.0)
MCHC: 32.7 g/dL (ref 31.5–35.7)
MCV: 83 fL (ref 79–97)
Monocytes Absolute: 1.2 10*3/uL — ABNORMAL HIGH (ref 0.1–0.9)
Monocytes: 10 %
NEUTROS PCT: 56 %
Neutrophils Absolute: 6.7 10*3/uL (ref 1.4–7.0)
PLATELETS: 289 10*3/uL (ref 150–379)
RBC: 4.36 x10E6/uL (ref 3.77–5.28)
RDW: 19.1 % — AB (ref 12.3–15.4)
WBC: 12 10*3/uL — AB (ref 3.4–10.8)

## 2018-01-24 ENCOUNTER — Other Ambulatory Visit: Payer: Self-pay | Admitting: Internal Medicine

## 2018-01-25 DIAGNOSIS — N183 Chronic kidney disease, stage 3 (moderate): Secondary | ICD-10-CM | POA: Diagnosis not present

## 2018-01-25 DIAGNOSIS — Z7901 Long term (current) use of anticoagulants: Secondary | ICD-10-CM | POA: Diagnosis not present

## 2018-01-25 DIAGNOSIS — I480 Paroxysmal atrial fibrillation: Secondary | ICD-10-CM | POA: Diagnosis not present

## 2018-01-25 DIAGNOSIS — I481 Persistent atrial fibrillation: Secondary | ICD-10-CM | POA: Diagnosis not present

## 2018-01-25 DIAGNOSIS — I2510 Atherosclerotic heart disease of native coronary artery without angina pectoris: Secondary | ICD-10-CM | POA: Diagnosis not present

## 2018-01-25 DIAGNOSIS — R0602 Shortness of breath: Secondary | ICD-10-CM | POA: Diagnosis not present

## 2018-01-25 DIAGNOSIS — I119 Hypertensive heart disease without heart failure: Secondary | ICD-10-CM | POA: Diagnosis not present

## 2018-01-25 DIAGNOSIS — E039 Hypothyroidism, unspecified: Secondary | ICD-10-CM | POA: Diagnosis not present

## 2018-01-25 DIAGNOSIS — E668 Other obesity: Secondary | ICD-10-CM | POA: Diagnosis not present

## 2018-01-25 DIAGNOSIS — G4733 Obstructive sleep apnea (adult) (pediatric): Secondary | ICD-10-CM | POA: Diagnosis not present

## 2018-01-25 DIAGNOSIS — I5021 Acute systolic (congestive) heart failure: Secondary | ICD-10-CM | POA: Diagnosis not present

## 2018-01-25 DIAGNOSIS — I5032 Chronic diastolic (congestive) heart failure: Secondary | ICD-10-CM | POA: Diagnosis not present

## 2018-01-29 ENCOUNTER — Ambulatory Visit (HOSPITAL_COMMUNITY)
Admission: RE | Admit: 2018-01-29 | Discharge: 2018-01-30 | Disposition: A | Discharge status: Home / Self Care | Payer: Medicare Other | Source: Ambulatory Visit | Attending: Internal Medicine | Admitting: Internal Medicine

## 2018-01-29 ENCOUNTER — Encounter (HOSPITAL_COMMUNITY): Payer: No Typology Code available for payment source

## 2018-01-29 ENCOUNTER — Other Ambulatory Visit: Payer: Self-pay

## 2018-01-29 ENCOUNTER — Encounter (HOSPITAL_COMMUNITY)
Admission: RE | Disposition: A | Discharge status: Home / Self Care | Payer: Self-pay | Source: Ambulatory Visit | Attending: Internal Medicine

## 2018-01-29 DIAGNOSIS — I4430 Unspecified atrioventricular block: Secondary | ICD-10-CM | POA: Diagnosis not present

## 2018-01-29 DIAGNOSIS — Z7901 Long term (current) use of anticoagulants: Secondary | ICD-10-CM | POA: Diagnosis not present

## 2018-01-29 DIAGNOSIS — F419 Anxiety disorder, unspecified: Secondary | ICD-10-CM | POA: Diagnosis not present

## 2018-01-29 DIAGNOSIS — E669 Obesity, unspecified: Secondary | ICD-10-CM | POA: Insufficient documentation

## 2018-01-29 DIAGNOSIS — G4730 Sleep apnea, unspecified: Secondary | ICD-10-CM | POA: Diagnosis not present

## 2018-01-29 DIAGNOSIS — Z6827 Body mass index (BMI) 27.0-27.9, adult: Secondary | ICD-10-CM | POA: Insufficient documentation

## 2018-01-29 DIAGNOSIS — I480 Paroxysmal atrial fibrillation: Secondary | ICD-10-CM | POA: Diagnosis not present

## 2018-01-29 DIAGNOSIS — E039 Hypothyroidism, unspecified: Secondary | ICD-10-CM | POA: Diagnosis not present

## 2018-01-29 DIAGNOSIS — Z8249 Family history of ischemic heart disease and other diseases of the circulatory system: Secondary | ICD-10-CM | POA: Diagnosis not present

## 2018-01-29 DIAGNOSIS — I130 Hypertensive heart and chronic kidney disease with heart failure and stage 1 through stage 4 chronic kidney disease, or unspecified chronic kidney disease: Secondary | ICD-10-CM | POA: Insufficient documentation

## 2018-01-29 DIAGNOSIS — Z9981 Dependence on supplemental oxygen: Secondary | ICD-10-CM | POA: Diagnosis not present

## 2018-01-29 DIAGNOSIS — Z950 Presence of cardiac pacemaker: Secondary | ICD-10-CM

## 2018-01-29 DIAGNOSIS — Z880 Allergy status to penicillin: Secondary | ICD-10-CM | POA: Diagnosis not present

## 2018-01-29 DIAGNOSIS — E785 Hyperlipidemia, unspecified: Secondary | ICD-10-CM | POA: Diagnosis not present

## 2018-01-29 DIAGNOSIS — I5022 Chronic systolic (congestive) heart failure: Secondary | ICD-10-CM | POA: Diagnosis not present

## 2018-01-29 DIAGNOSIS — M479 Spondylosis, unspecified: Secondary | ICD-10-CM | POA: Diagnosis not present

## 2018-01-29 DIAGNOSIS — N183 Chronic kidney disease, stage 3 (moderate): Secondary | ICD-10-CM | POA: Insufficient documentation

## 2018-01-29 DIAGNOSIS — I447 Left bundle-branch block, unspecified: Secondary | ICD-10-CM | POA: Diagnosis not present

## 2018-01-29 DIAGNOSIS — K219 Gastro-esophageal reflux disease without esophagitis: Secondary | ICD-10-CM | POA: Insufficient documentation

## 2018-01-29 DIAGNOSIS — I255 Ischemic cardiomyopathy: Secondary | ICD-10-CM | POA: Insufficient documentation

## 2018-01-29 DIAGNOSIS — I2510 Atherosclerotic heart disease of native coronary artery without angina pectoris: Secondary | ICD-10-CM | POA: Insufficient documentation

## 2018-01-29 HISTORY — PX: BIV PACEMAKER INSERTION CRT-P: EP1199

## 2018-01-29 LAB — SURGICAL PCR SCREEN
MRSA, PCR: NEGATIVE
STAPHYLOCOCCUS AUREUS: NEGATIVE

## 2018-01-29 SURGERY — BIV PACEMAKER INSERTION CRT-P

## 2018-01-29 MED ORDER — SODIUM CHLORIDE 0.9 % IV SOLN
INTRAVENOUS | Status: DC
  Administered 2018-01-29: 08:00:00 via INTRAVENOUS

## 2018-01-29 MED ORDER — ONDANSETRON HCL 4 MG/2ML IJ SOLN: 4.0000 mg | Freq: Four times a day (QID) | INTRAMUSCULAR | Status: DC | PRN

## 2018-01-29 MED ORDER — ACETAMINOPHEN 325 MG PO TABS: 650.0000 mg | ORAL_TABLET | Freq: Four times a day (QID) | ORAL | Status: DC | PRN

## 2018-01-29 MED ORDER — IOPAMIDOL (ISOVUE-370) INJECTION 76%
INTRAVENOUS | Status: AC
  Filled 2018-01-29: qty 50

## 2018-01-29 MED ORDER — FENTANYL CITRATE (PF) 100 MCG/2ML IJ SOLN
INTRAMUSCULAR | Status: DC | PRN
  Administered 2018-01-29 (×2): 12.5 ug via INTRAVENOUS

## 2018-01-29 MED ORDER — MIDAZOLAM HCL 5 MG/5ML IJ SOLN
INTRAMUSCULAR | Status: DC | PRN
  Administered 2018-01-29 (×3): 1 mg via INTRAVENOUS

## 2018-01-29 MED ORDER — VANCOMYCIN HCL IN DEXTROSE 1-5 GM/200ML-% IV SOLN
1000.0000 mg | INTRAVENOUS | Status: AC
  Administered 2018-01-29: 1000 mg via INTRAVENOUS

## 2018-01-29 MED ORDER — SODIUM CHLORIDE 0.9 % IR SOLN
80.0000 mg | Status: AC
  Administered 2018-01-29: 80 mg

## 2018-01-29 MED ORDER — LIDOCAINE HCL 1 % IJ SOLN
INTRAMUSCULAR | Status: AC
  Filled 2018-01-29: qty 20

## 2018-01-29 MED ORDER — HEPARIN (PORCINE) IN NACL 2-0.9 UNIT/ML-% IJ SOLN
INTRAMUSCULAR | Status: AC
  Filled 2018-01-29: qty 500

## 2018-01-29 MED ORDER — IOPAMIDOL (ISOVUE-370) INJECTION 76%
INTRAVENOUS | Status: DC | PRN
  Administered 2018-01-29: 50 mL via INTRAVENOUS

## 2018-01-29 MED ORDER — ACETAMINOPHEN 325 MG PO TABS
325.0000 mg | ORAL_TABLET | ORAL | Status: DC | PRN
  Administered 2018-01-29 – 2018-01-30 (×3): 650 mg via ORAL
  Administered 2018-01-30: 325 mg via ORAL
  Filled 2018-01-29 (×4): qty 2

## 2018-01-29 MED ORDER — ACETAMINOPHEN 325 MG PO TABS
ORAL_TABLET | ORAL | Status: AC
  Filled 2018-01-29: qty 2

## 2018-01-29 MED ORDER — CHLORHEXIDINE GLUCONATE 4 % EX LIQD: 60.0000 mL | Freq: Once | CUTANEOUS | Status: DC

## 2018-01-29 MED ORDER — PANTOPRAZOLE SODIUM 40 MG PO TBEC
40.0000 mg | DELAYED_RELEASE_TABLET | Freq: Every day | ORAL | Status: DC
  Administered 2018-01-30: 40 mg via ORAL
  Filled 2018-01-29: qty 1

## 2018-01-29 MED ORDER — ATORVASTATIN CALCIUM 80 MG PO TABS: 80.0000 mg | ORAL_TABLET | Freq: Every day | ORAL | Status: DC

## 2018-01-29 MED ORDER — AMIODARONE HCL 200 MG PO TABS
200.0000 mg | ORAL_TABLET | Freq: Every day | ORAL | Status: DC
  Administered 2018-01-30: 200 mg via ORAL
  Filled 2018-01-29: qty 1

## 2018-01-29 MED ORDER — METOPROLOL SUCCINATE ER 50 MG PO TB24
50.0000 mg | ORAL_TABLET | Freq: Every day | ORAL | Status: DC
  Administered 2018-01-30: 50 mg via ORAL
  Filled 2018-01-29: qty 1

## 2018-01-29 MED ORDER — ACETAMINOPHEN 325 MG PO TABS
650.0000 mg | ORAL_TABLET | Freq: Four times a day (QID) | ORAL | Status: DC | PRN
  Administered 2018-01-29: 650 mg via ORAL
  Filled 2018-01-29: qty 2

## 2018-01-29 MED ORDER — SPIRONOLACTONE 12.5 MG HALF TABLET
12.5000 mg | ORAL_TABLET | Freq: Every day | ORAL | Status: DC
  Administered 2018-01-29 – 2018-01-30 (×2): 12.5 mg via ORAL
  Filled 2018-01-29 (×2): qty 1

## 2018-01-29 MED ORDER — BUTALBITAL-APAP-CAFFEINE 50-325-40 MG PO TABS: 1.0000 | ORAL_TABLET | ORAL | Status: DC | PRN

## 2018-01-29 MED ORDER — LEVOTHYROXINE SODIUM 100 MCG PO TABS
200.0000 ug | ORAL_TABLET | Freq: Every day | ORAL | Status: DC
  Administered 2018-01-30: 200 ug via ORAL
  Filled 2018-01-29: qty 2

## 2018-01-29 MED ORDER — LIDOCAINE HCL (PF) 1 % IJ SOLN
INTRAMUSCULAR | Status: DC | PRN
  Administered 2018-01-29: 45 mL

## 2018-01-29 MED ORDER — MIDAZOLAM HCL 5 MG/5ML IJ SOLN
INTRAMUSCULAR | Status: AC
  Filled 2018-01-29: qty 5

## 2018-01-29 MED ORDER — OMEPRAZOLE MAGNESIUM 20 MG PO TBEC: 20.0000 mg | DELAYED_RELEASE_TABLET | Freq: Every day | ORAL | Status: DC | PRN

## 2018-01-29 MED ORDER — SODIUM CHLORIDE 0.9 % IR SOLN
Status: AC
  Filled 2018-01-29: qty 2

## 2018-01-29 MED ORDER — VANCOMYCIN HCL IN DEXTROSE 1-5 GM/200ML-% IV SOLN
1000.0000 mg | Freq: Two times a day (BID) | INTRAVENOUS | Status: AC
  Administered 2018-01-29: 1000 mg via INTRAVENOUS
  Filled 2018-01-29: qty 200

## 2018-01-29 MED ORDER — HEPARIN (PORCINE) IN NACL 2-0.9 UNIT/ML-% IJ SOLN
INTRAMUSCULAR | Status: AC | PRN
  Administered 2018-01-29: 500 mL

## 2018-01-29 MED ORDER — FENTANYL CITRATE (PF) 100 MCG/2ML IJ SOLN
INTRAMUSCULAR | Status: AC
  Filled 2018-01-29: qty 2

## 2018-01-29 MED ORDER — MUPIROCIN 2 % EX OINT
TOPICAL_OINTMENT | CUTANEOUS | Status: AC
  Administered 2018-01-29: 1
  Filled 2018-01-29: qty 22

## 2018-01-29 MED ORDER — VANCOMYCIN HCL IN DEXTROSE 1-5 GM/200ML-% IV SOLN
INTRAVENOUS | Status: AC
  Filled 2018-01-29: qty 200

## 2018-01-29 SURGICAL SUPPLY — 15 items
ADAPTER SEALING SSA-EW-09 (MISCELLANEOUS) ×2 IMPLANT
ADPR INTRO LNG 9FR SL XTD WNG (MISCELLANEOUS) ×1
CABLE SURGICAL S-101-97-12 (CABLE) ×2 IMPLANT
CATH ATTAIN COM SURV 6250V-MB2 (CATHETERS) ×2 IMPLANT
CATH HEX JOSEPH 2-5-2 65CM 6F (CATHETERS) ×2 IMPLANT
DEVICE CRTP PERCEPTA QUAD MRI (Pacemaker) ×3 IMPLANT
LEAD ATTAIN PERFORMA S 4598-88 (Lead) ×2 IMPLANT
LEAD CAPSURE NOVUS 45CM (Lead) ×2 IMPLANT
LEAD CAPSURE NOVUS 5076-52CM (Lead) ×2 IMPLANT
PAD DEFIB LIFELINK (PAD) ×2 IMPLANT
SHEATH CLASSIC 7F (SHEATH) ×4 IMPLANT
SHEATH CLASSIC 9F (SHEATH) ×2 IMPLANT
SLITTER 6232ADJ (MISCELLANEOUS) ×2 IMPLANT
TRAY PACEMAKER INSERTION (PACKS) ×2 IMPLANT
WIRE MAILMAN 182CM (WIRE) ×3 IMPLANT

## 2018-01-29 NOTE — Discharge Summary (Addendum)
ELECTROPHYSIOLOGY PROCEDURE DISCHARGE SUMMARY    Patient ID: [name],  MRN: [id number], DOB/AGE: 82-Mar-1933 82 y.o.  Admit date: 01/29/2018 Discharge date: 01/30/18  Primary Care Physician: Fanny Bien, MD Primary Cardiologist: Dr. Haroldine Laws Electrophysiologist: Dr. Lovena Le  Primary Discharge Diagnosis:  1. NICM, chronic CHF 2. LBBB 3. VHD w/severe MR  Secondary Discharge Diagnosis:  1. HTN 2. CRI 3. CAD 4. Hypothyroidism 5. PAFib     CHA2DS2Vasc is 5, on Eliquis,  dosed at 2.5mg , baseline Creat appears about 1.5   Allergies  Allergen Reactions  . Ampicillin Shortness Of Breath, Swelling and Rash  . Hydromorphone Hcl Shortness Of Breath, Itching, Swelling and Rash  . Penicillins Anaphylaxis and Other (See Comments)    Has patient had a PCN reaction causing immediate rash, facial/tongue/throat swelling, SOB or lightheadedness with hypotension: Yes Has patient had a PCN reaction causing severe rash involving mucus membranes or skin necrosis: Unknown Has patient had a PCN reaction that required hospitalization: Yes (was in the hosp) Has patient had a PCN reaction occurring within the last 10 years: No If all of the above answers are "NO", then may proceed with Cephalosporin use.      Procedures This Admission:  1.  Implantation of a CRT-P on 01/29/18 by Dr Lovena Le.  The patient received a Medtronic (serial number [id number]) pacemaker, Medtronic model [id number] (serial number [id number]) right atrial lead and a Medtronic model [id number] (serial number [id number]) right ventricular lead, A medtronic(serial number [id number]) lead  There were no immediate post procedure complications. 2.  CXR on 01/30/18 demonstrated no pneumothorax status post device implantation.   Brief HPI: [name] is a 84 y.o. female was referred to electrophysiology in the outpatient setting for consideration of CRT-PPM implantation.  Past medical history included above.  The patient has had  symptomatic bradycardia without reversible causes identified.  Risks, benefits, and alternatives to PPM implantation were reviewed with the patient who wished to proceed.   Hospital Course:  The patient was admitted and underwent implantation of a CRT-P with details as outlined above. She was monitored on telemetry overnight which demonstrated SR, V pacing.  Left chest was without hematoma or ecchymosis.  The device was interrogated and found to be functioning normally.  CXR was obtained and demonstrated no pneumothorax status post device implantation.  Wound care, arm mobility, and restrictions were reviewed with the patient.  The patient was examined by Dr. Lovena Le and considered stable for discharge to home.    Physical Exam: Vitals:   01/29/18 1635 01/29/18 1705 01/29/18 2036 01/30/18 0518  BP: (!) 122/41 (!) 111/56 (!) 113/33 (!) 147/53  Pulse:   66 71  Resp:      Temp:   (!) 97.4 F (36.3 C) (!) 97.5 F (36.4 C)  TempSrc:   Oral Oral  SpO2:   (!) 4% 99%  Weight:    174 lb 9.7 oz (79.2 kg)  Height:        GEN- The patient is well appearing, alert and oriented x 3 today.   HEENT: normocephalic, atraumatic; sclera clear, conjunctiva pink; hearing intact; oropharynx clear; neck supple, no JVP Lungs- CTA b/l, normal work of breathing.  No wheezes, rales, rhonchi Heart- RRR, no murmurs, rubs or gallops, PMI not laterally displaced GI- soft, non-tender, non-distended Extremities- no clubbing, cyanosis, or edema MS- no significant deformity, age appropriate atrophy Skin- warm and dry, no rash or lesion,  left chest without hematoma/ecchymosis Psych- euthymic mood, full affect Neuro-  no gross deficits   Labs:   Lab Results  Component Value Date   WBC 12.0 (H) 01/18/2018   HGB 11.8 01/18/2018   HCT 36.1 01/18/2018   MCV 83 01/18/2018   PLT 289 01/18/2018   No results for input(s): NA, K, CL, CO2, BUN, CREATININE, CALCIUM, PROT, BILITOT, ALKPHOS, ALT, AST, GLUCOSE in the last 168  hours.  Invalid input(s): LABALBU  Discharge Medications:  Allergies as of 01/30/2018      Reactions   Ampicillin Shortness Of Breath, Swelling, Rash   Hydromorphone Hcl Shortness Of Breath, Itching, Swelling, Rash   Penicillins Anaphylaxis, Other (See Comments)   Has patient had a PCN reaction causing immediate rash, facial/tongue/throat swelling, SOB or lightheadedness with hypotension: Yes Has patient had a PCN reaction causing severe rash involving mucus membranes or skin necrosis: Unknown Has patient had a PCN reaction that required hospitalization: Yes (was in the hosp) Has patient had a PCN reaction occurring within the last 10 years: No If all of the above answers are "NO", then may proceed with Cephalosporin use.      Medication List    TAKE these medications   acetaminophen 325 MG tablet Commonly known as:  TYLENOL Take 2 tablets (650 mg total) by mouth every 6 (six) hours as needed for mild pain (or Fever >/= 101).   amiodarone 200 MG tablet Commonly known as:  PACERONE Take 1 tablet (200 mg total) by mouth daily.   apixaban 2.5 MG Tabs tablet Commonly known as:  ELIQUIS Take 1 tablet (2.5 mg total) by mouth 2 (two) times daily. Notes to patient:  Resume on 02/02/18   atorvastatin 80 MG tablet Commonly known as:  LIPITOR Take 1 tablet (80 mg total) by mouth daily.   butalbital-acetaminophen-caffeine 50-325-40 MG tablet Commonly known as:  FIORICET, ESGIC Take 1 tablet by mouth every 4 (four) hours as needed for headache or migraine.   furosemide 40 MG tablet Commonly known as:  LASIX Take 2 tablets (80 mg total) by mouth daily. What changed:    when to take this  additional instructions   levothyroxine 200 MCG tablet Commonly known as:  SYNTHROID, LEVOTHROID Take 1 tablet (200 mcg total) by mouth daily before breakfast.   metoprolol succinate 50 MG 24 hr tablet Commonly known as:  TOPROL-XL Take 1 tablet (50 mg total) by mouth daily. Take with or  immediately following a meal.   omeprazole 20 MG tablet Commonly known as:  PRILOSEC OTC Take 20 mg by mouth daily as needed (for reflux).   polyethylene glycol packet Commonly known as:  MIRALAX / GLYCOLAX Take 17 g by mouth daily. What changed:    when to take this  reasons to take this   senna-docusate 8.6-50 MG tablet Commonly known as:  Senokot-S Take 1 tablet by mouth at bedtime. What changed:    when to take this  reasons to take this   spironolactone 25 MG tablet Commonly known as:  ALDACTONE Take 0.5 tablets (12.5 mg total) by mouth daily.       Disposition:  Home   Discharge Instructions    Diet - low sodium heart healthy   Complete by:  As directed    Increase activity slowly   Complete by:  As directed      Follow-up Information    [facility] Follow up on 02/13/2018.   Specialty:  Cardiology Why:  3:30PM, wound check visit Contact information: [address] [place]  [place]       Evans Lance, MD Follow up on 05/03/2018.   Specialty:  Cardiology Why:  3:00PM Contact information: [address] [place] [address] [phone]           Duration of Discharge Encounter: Greater than 30 minutes including physician time.  Venetia Night, PA-C 01/30/2018 9:27 AM   EP Attending  Patient seen and examined. Agree with above. The patient is doing well after insertion of a BiV PPM. Her CXR looks good and PM interogation under my direction demonstrates normal function. She will be discharged home with the usual followup.   Mikle Bosworth.D.

## 2018-01-29 NOTE — Interval H&P Note (Signed)
History and Physical Interval Note:  01/29/2018 9:51 AM  [name]  has presented today for surgery, with the diagnosis of LBBB, chronic systolic heart failure  The various methods of treatment have been discussed with the patient and family. After consideration of risks, benefits and other options for treatment, the patient has consented to  Procedure(s): BIV PACEMAKER INSERTION CRT-P (N/A) as a surgical intervention .  The patient's history has been reviewed, patient examined, no change in status, stable for surgery.  I have reviewed the patient's chart and labs.  Questions were answered to the patient's satisfaction.     Mikle Bosworth.D.

## 2018-01-29 NOTE — Discharge Instructions (Signed)
° ° °  Supplemental Discharge Instructions for  °Pacemaker/Defibrillator Patients ° °Activity °No heavy lifting or vigorous activity with your left/right arm for 6 to 8 weeks.  Do not raise your left/right arm above your head for one week.  Gradually raise your affected arm as drawn below. ° °        °   02/02/18                       02/03/18                      02/04/18                  02/05/18 °__ ° °NO DRIVING for  1 week  ; you may begin driving on   02/05/18  . ° °WOUND CARE °- Keep the wound area clean and dry.  Do not get this area wet for one week. No showers for one week; you may shower on  02/05/18   . °- The tape/steri-strips on your wound will fall off; do not pull them off.  No bandage is needed on the site.  DO  NOT apply any creams, oils, or ointments to the wound area. °- If you notice any drainage or discharge from the wound, any swelling or bruising at the site, or you develop a fever > 101? F after you are discharged home, call the office at once. ° °Special Instructions °- You are still able to use cellular telephones; use the ear opposite the side where you have your pacemaker/defibrillator.  Avoid carrying your cellular phone near your device. °- When traveling through airports, show security personnel your identification card to avoid being screened in the metal detectors.  Ask the security personnel to use the hand wand. °- Avoid arc welding equipment, MRI testing (magnetic resonance imaging), TENS units (transcutaneous nerve stimulators).  Call the office for questions about other devices. °- Avoid electrical appliances that are in poor condition or are not properly grounded. °- Microwave ovens are safe to be near or to operate. ° °Additional information for defibrillator patients should your device go off: °- If your device goes off ONCE and you feel fine afterward, notify the device clinic nurses. °- If your device goes off ONCE and you do not feel well afterward, call 911. °- If your device  goes off TWICE, call 911. °- If your device goes off THREE times in one day, call 911. ° °DO NOT DRIVE YOURSELF OR A FAMILY MEMBER °WITH A DEFIBRILLATOR TO THE HOSPITAL--CALL 911. ° °

## 2018-01-30 ENCOUNTER — Ambulatory Visit (HOSPITAL_COMMUNITY): Payer: Medicare Other

## 2018-01-30 ENCOUNTER — Encounter (HOSPITAL_COMMUNITY): Payer: Self-pay | Admitting: Internal Medicine

## 2018-01-30 DIAGNOSIS — F419 Anxiety disorder, unspecified: Secondary | ICD-10-CM | POA: Diagnosis not present

## 2018-01-30 DIAGNOSIS — I447 Left bundle-branch block, unspecified: Secondary | ICD-10-CM

## 2018-01-30 DIAGNOSIS — I5022 Chronic systolic (congestive) heart failure: Secondary | ICD-10-CM

## 2018-01-30 DIAGNOSIS — N183 Chronic kidney disease, stage 3 (moderate): Secondary | ICD-10-CM | POA: Diagnosis not present

## 2018-01-30 DIAGNOSIS — E785 Hyperlipidemia, unspecified: Secondary | ICD-10-CM | POA: Diagnosis not present

## 2018-01-30 DIAGNOSIS — I2510 Atherosclerotic heart disease of native coronary artery without angina pectoris: Secondary | ICD-10-CM | POA: Diagnosis not present

## 2018-01-30 DIAGNOSIS — E039 Hypothyroidism, unspecified: Secondary | ICD-10-CM | POA: Diagnosis not present

## 2018-01-30 DIAGNOSIS — I130 Hypertensive heart and chronic kidney disease with heart failure and stage 1 through stage 4 chronic kidney disease, or unspecified chronic kidney disease: Secondary | ICD-10-CM | POA: Diagnosis not present

## 2018-01-30 DIAGNOSIS — I255 Ischemic cardiomyopathy: Secondary | ICD-10-CM | POA: Diagnosis not present

## 2018-01-30 DIAGNOSIS — I480 Paroxysmal atrial fibrillation: Secondary | ICD-10-CM | POA: Diagnosis not present

## 2018-01-30 DIAGNOSIS — Z950 Presence of cardiac pacemaker: Secondary | ICD-10-CM | POA: Diagnosis not present

## 2018-01-30 DIAGNOSIS — I4430 Unspecified atrioventricular block: Secondary | ICD-10-CM | POA: Diagnosis not present

## 2018-01-30 DIAGNOSIS — K219 Gastro-esophageal reflux disease without esophagitis: Secondary | ICD-10-CM | POA: Diagnosis not present

## 2018-01-30 MED FILL — Gentamicin Sulfate Inj 40 MG/ML: INTRAMUSCULAR | Qty: 80 | Status: AC

## 2018-02-01 ENCOUNTER — Telehealth: Payer: Self-pay

## 2018-02-01 NOTE — Telephone Encounter (Signed)
Pt is aware and agreeable to results and no change in medication regiment

## 2018-02-01 NOTE — Telephone Encounter (Signed)
-----   Message from Evans Lance, MD sent at 01/31/2018  9:34 PM EST ----- No change.

## 2018-02-02 DIAGNOSIS — I2510 Atherosclerotic heart disease of native coronary artery without angina pectoris: Secondary | ICD-10-CM | POA: Diagnosis not present

## 2018-02-02 DIAGNOSIS — I130 Hypertensive heart and chronic kidney disease with heart failure and stage 1 through stage 4 chronic kidney disease, or unspecified chronic kidney disease: Secondary | ICD-10-CM | POA: Diagnosis not present

## 2018-02-02 DIAGNOSIS — I481 Persistent atrial fibrillation: Secondary | ICD-10-CM | POA: Diagnosis not present

## 2018-02-02 DIAGNOSIS — N183 Chronic kidney disease, stage 3 (moderate): Secondary | ICD-10-CM | POA: Diagnosis not present

## 2018-02-02 DIAGNOSIS — E039 Hypothyroidism, unspecified: Secondary | ICD-10-CM | POA: Diagnosis not present

## 2018-02-02 DIAGNOSIS — I5023 Acute on chronic systolic (congestive) heart failure: Secondary | ICD-10-CM | POA: Diagnosis not present

## 2018-02-05 ENCOUNTER — Telehealth (HOSPITAL_COMMUNITY): Payer: Self-pay

## 2018-02-05 NOTE — Telephone Encounter (Signed)
CHF Clinic appointment reminder call placed to patient for upcoming follow up appt.  Does understand purpose of this appointment and where CHF Clinic is located? Yes  How is patient feeling? Well, no complaints  Does patient have all of their medications? Yes  Patient also reminded to take all medications as prescribed on the day of his/her appointment and to bring all medications to this appointment.  Advised to call our office for tardiness or cancellations/rescheduling needs.  Leory Plowman, Guinevere Ferrari

## 2018-02-06 ENCOUNTER — Ambulatory Visit (HOSPITAL_COMMUNITY)
Admission: RE | Admit: 2018-02-06 | Discharge: 2018-02-06 | Disposition: A | Discharge status: Home / Self Care | Payer: Medicare Other | Source: Ambulatory Visit | Attending: Cardiology | Admitting: Cardiology

## 2018-02-06 ENCOUNTER — Encounter (HOSPITAL_COMMUNITY): Payer: Self-pay

## 2018-02-06 ENCOUNTER — Encounter (HOSPITAL_COMMUNITY): Payer: No Typology Code available for payment source

## 2018-02-06 VITALS — BP 164/74 | HR 86 | Wt 166.0 lb

## 2018-02-06 DIAGNOSIS — Z880 Allergy status to penicillin: Secondary | ICD-10-CM | POA: Diagnosis not present

## 2018-02-06 DIAGNOSIS — Z79899 Other long term (current) drug therapy: Secondary | ICD-10-CM | POA: Insufficient documentation

## 2018-02-06 DIAGNOSIS — I340 Nonrheumatic mitral (valve) insufficiency: Secondary | ICD-10-CM | POA: Insufficient documentation

## 2018-02-06 DIAGNOSIS — N183 Chronic kidney disease, stage 3 unspecified: Secondary | ICD-10-CM

## 2018-02-06 DIAGNOSIS — I5023 Acute on chronic systolic (congestive) heart failure: Secondary | ICD-10-CM | POA: Diagnosis not present

## 2018-02-06 DIAGNOSIS — E039 Hypothyroidism, unspecified: Secondary | ICD-10-CM | POA: Insufficient documentation

## 2018-02-06 DIAGNOSIS — I447 Left bundle-branch block, unspecified: Secondary | ICD-10-CM | POA: Diagnosis not present

## 2018-02-06 DIAGNOSIS — Z955 Presence of coronary angioplasty implant and graft: Secondary | ICD-10-CM | POA: Insufficient documentation

## 2018-02-06 DIAGNOSIS — I5022 Chronic systolic (congestive) heart failure: Secondary | ICD-10-CM | POA: Diagnosis not present

## 2018-02-06 DIAGNOSIS — I119 Hypertensive heart disease without heart failure: Secondary | ICD-10-CM

## 2018-02-06 DIAGNOSIS — I480 Paroxysmal atrial fibrillation: Secondary | ICD-10-CM | POA: Diagnosis not present

## 2018-02-06 DIAGNOSIS — I2510 Atherosclerotic heart disease of native coronary artery without angina pectoris: Secondary | ICD-10-CM | POA: Diagnosis not present

## 2018-02-06 DIAGNOSIS — I130 Hypertensive heart and chronic kidney disease with heart failure and stage 1 through stage 4 chronic kidney disease, or unspecified chronic kidney disease: Secondary | ICD-10-CM | POA: Diagnosis not present

## 2018-02-06 MED ORDER — SPIRONOLACTONE 25 MG PO TABS: 25.0000 mg | ORAL_TABLET | Freq: Every day | ORAL | 3 refills | Status: DC

## 2018-02-06 NOTE — Patient Instructions (Signed)
INCREASE Spironolactone to 25 mg (1 whole tablet) once daily.  Follow up 1 week for lab work.  ___________________________________________________ [place] Code: 9002  Follow up in 4 weeks with Amy Clegg NP-C.  ____________________________________________________ [place] Roberts Code: 1100  Take all medication as prescribed the day of your appointment. Bring all medications with you to your appointment.  Do the following things EVERYDAY: 1) Weigh yourself in the morning before breakfast. Write it down and keep it in a log. 2) Take your medicines as prescribed 3) Eat low salt foods-Limit salt (sodium) to 2000 mg per day.  4) Stay as active as you can everyday 5) Limit all fluids for the day to less than 2 liters

## 2018-02-06 NOTE — Progress Notes (Signed)
Advanced Heart Failure Clinic Note   Primary Cardiologist: Dr. Wynonia Lawman HF: Dr. Haroldine Laws   HPI:  [name] is a 84 y.o. female with h/o HTN, HL, CKD 3, CAD s/p PCI to LAD in 2003, PAH, hypothyroidism, and systolic CHF.  Initially followed by Dr. Rex Kras. Had PCI of LAD in 2003. F/u cath in 2012 with patent LAD stent with 40% diagonal lesion. Otherwise normal coronaries. She was subsequently seen by Dr. Einar Gip before switching to Dr. Wynonia Lawman in 2015.  She presented to Dr. Wynonia Lawman in 4/18  and found to be in new onset AF with HF. Started on amio with plans for DC-CV. However she did not f/u. She was admitted in was readmitted in July and found to be profoundly hypothyroid as well as with rapid atrial fibrillation. She was diuresed during that admission and was feeling much better. Over the past 6 months she reports that she has grown increasingly weak and now unable to leave the house and grandchildren are doing most of her errands for her.   Echo 11/25/2017 LVEF 40-45% (Thought closer to 35-40% by Dr. Haroldine Laws) with moderate RV dysfunction and severe MR with dyssynchrony due to LBBB.   Admitted 12/28 -> 12/08/17 with Acute on chronic systolic CHF complicated by Afib. Underwent successful DCCV 12/05/17. Hypothyroidism thought stable enough for sedation. Diuresed with improved symptoms. Hospital course complicated by mild AKI with creatinine peak at 2.0 Planned for close follow up with labwork. Discharged to SNF. Discharge weight 176 lbs.  Today she returns for HF follow up.  Since the last visit she underwent BiVi on 3/5. She says she has not had real change in her functional capacity. Still with fatigue after she walks around. Mild dyspnea with steps. Denies PND/Orthopna. Weight at home has been stable. No bleeding problems. No fever or chills. Taking all medications. Currently not driving. Lives alone but has support from her son and daughter in law. Pill box is arranged by her daughter in law.     Review of systems complete and found to be negative unless listed in HPI.    Past Medical History:  Diagnosis Date  . Anxiety    "some; not treated for it" (11/27/2014)  . Bradycardia   . CAD (coronary artery disease), native coronary artery    2003 normal left main, 80% mid LAD  2.5 x 13 mm Cypher stent to LAD Dr. Rex Kras, subsequent cath 2004 showed patent stent in LAD with no sig disease in RCA or Circ;    . CHF (congestive heart failure) ([place])   . CKD (chronic kidney disease), stage III ([place])   . GERD (gastroesophageal reflux disease)   . History of hiatal hernia   . Hyperlipidemia   . Hypertension   . LBBB (left bundle branch block)   . Migraine    "very rare now" (11/27/2014)  . Obesity (BMI 30-39.9) 06/18/2017  . On home oxygen therapy    "only at night; ? setting" (11/27/2014)  . Osteoarthritis cervical spine   . Sleep apnea 06/18/2017    Current Outpatient Medications  Medication Sig Dispense Refill  . acetaminophen (TYLENOL) 325 MG tablet Take 2 tablets (650 mg total) by mouth every 6 (six) hours as needed for mild pain (or Fever >/= 101). 30 tablet 1  . amiodarone (PACERONE) 200 MG tablet Take 1 tablet (200 mg total) by mouth daily. 30 tablet 0  . apixaban (ELIQUIS) 2.5 MG TABS tablet Take 1 tablet (2.5 mg total) by mouth 2 (two) times  daily. 60 tablet 0  . atorvastatin (LIPITOR) 80 MG tablet Take 1 tablet (80 mg total) by mouth daily. 30 tablet 0  . butalbital-acetaminophen-caffeine (FIORICET, ESGIC) 50-325-40 MG tablet Take 1 tablet by mouth every 4 (four) hours as needed for headache or migraine.     . furosemide (LASIX) 40 MG tablet Take 2 tablets (80 mg total) by mouth daily. (Patient taking differently: Take 80 mg by mouth See admin instructions. Take 80 mg by mouth daily. May take 40 mg by mouth at bedtime if needed for fluid) 60 tablet 3  . levothyroxine (SYNTHROID, LEVOTHROID) 200 MCG tablet Take 1 tablet (200 mcg total) by mouth daily before breakfast. 30 tablet  0  . metoprolol succinate (TOPROL-XL) 50 MG 24 hr tablet Take 1 tablet (50 mg total) by mouth daily. Take with or immediately following a meal. 30 tablet 0  . omeprazole (PRILOSEC OTC) 20 MG tablet Take 20 mg by mouth daily as needed (for reflux).    . polyethylene glycol (MIRALAX / GLYCOLAX) packet Take 17 g by mouth daily. (Patient taking differently: Take 17 g by mouth daily as needed for moderate constipation. ) 14 each 0  . senna-docusate (SENOKOT-S) 8.6-50 MG tablet Take 1 tablet by mouth at bedtime. (Patient taking differently: Take 1 tablet by mouth at bedtime as needed for mild constipation. ) 30 tablet 0  . spironolactone (ALDACTONE) 25 MG tablet Take 0.5 tablets (12.5 mg total) by mouth daily. 30 tablet 0   No current facility-administered medications for this encounter.     Allergies  Allergen Reactions  . Ampicillin Shortness Of Breath, Swelling and Rash  . Hydromorphone Hcl Shortness Of Breath, Itching, Swelling and Rash  . Penicillins Anaphylaxis and Other (See Comments)    Has patient had a PCN reaction causing immediate rash, facial/tongue/throat swelling, SOB or lightheadedness with hypotension: Yes Has patient had a PCN reaction causing severe rash involving mucus membranes or skin necrosis: Unknown Has patient had a PCN reaction that required hospitalization: Yes (was in the hosp) Has patient had a PCN reaction occurring within the last 10 years: No If all of the above answers are "NO", then may proceed with Cephalosporin use.      Social History   Socioeconomic History  . Marital status: Divorced    Spouse name: Not on file  . Number of children: 3  . Years of education: college  . Highest education level: Not on file  Social Needs  . Financial resource strain: Not on file  . Food insecurity - worry: Not on file  . Food insecurity - inability: Not on file  . Transportation needs - medical: Not on file  . Transportation needs - non-medical: Not on file    Occupational History  . Occupation: Retired   Tobacco Use  . Smoking status: Never Smoker  . Smokeless tobacco: Never Used  Substance and Sexual Activity  . Alcohol use: No    Alcohol/week: 0.0 oz  . Drug use: No  . Sexual activity: No  Other Topics Concern  . Not on file  Social History Narrative   Drinks tea, occasional coffee     Family History  Problem Relation Age of Onset  . Heart attack Mother   . Hypertension Mother   . Heart Problems Mother   . Heart attack Father   . Hypertension Father     Vitals:   02/06/18 1335  BP: (!) 164/74  Pulse: 86  SpO2: 100%  Weight: 166 lb (75.3 kg)  Wt Readings from Last 3 Encounters:  02/06/18 166 lb (75.3 kg)  01/30/18 174 lb 9.7 oz (79.2 kg)  01/18/18 164 lb 12.8 oz (74.8 kg)   PHYSICAL EXAM: General:  No resp difficulty. Walked slowly in the clinic.  HEENT: normal Neck: supple. no JVD. Carotids 2+ bilat; no bruits. No lymphadenopathy or thryomegaly appreciated. Cor: PMI nondisplaced. Regular rate & rhythm. No rubs, gallops or murmurs. Left upper chest steri strips intact.  Lungs: clear Abdomen: soft, nontender, nondistended. No hepatosplenomegaly. No bruits or masses. Good bowel sounds. Extremities: no cyanosis, clubbing, rash, edema Neuro: alert & orientedx3, cranial nerves grossly intact. moves all 4 extremities w/o difficulty. Affect pleasant   ASSESSMENT & PLAN:  1. Chronic systolic HF with biventricualr failure - Echo 11/25/17  Appears to have EF 35-40% with moderate RV dysfunction and severe MR with dyssynchrony due to LBBB. (EF 45-50% in 7/18)  S/P Medtronic CRT-P 04/29/7034  -  Complicated by RV failure.  - Continue lasix 80 mg daily.  - Increase spiro to 25 mg daily. BMET in 7-10 days.  - Continue toprol 50 mg daily.  - No ACE/ARB for now with AKI.  -2. PAF  - NSR/sinus brady s/p DCCV 12/05/17 - Continue amiodarone 200 mg daily.  - Continue Eliquis 2.5 mg BID. - No bleeding problems.  Denies bleeding.    3. LBBB -  S/P CRT-P 3-4  Has wound check next week.   4. Severe mitral regurgitation - Secondary to LV dilation. Not candidate for MV clip.  - No change.   5. CAD s/p LAD stent 2003 - No S/S ischemia.  -  2012 with patent stent and otherwise minimal CAD - Continue statin. Off ASA with Eliquis  6. Foot wound - resolved.   7. CKD III - Baseline 1.3 - 1.5 range. I reviewed creatinine from 01/18/2018. Stable.  - 8. Hypothyroidism - Suspect due to amio. - Continue to follow on low dose amio for now.  - Continue synthroid.  - Although TSH high. T4 is only mildly reduced.  9. HTN Stable.   Follow up in 4 weeks. Plan to check BMET next week at wound check at [facility]. Anticipate repeating ECHO in 3 months.    Darrick Grinder, NP 02/06/18

## 2018-02-09 ENCOUNTER — Other Ambulatory Visit: Payer: Self-pay | Admitting: Internal Medicine

## 2018-02-13 ENCOUNTER — Other Ambulatory Visit: Payer: Self-pay | Admitting: Internal Medicine

## 2018-02-13 ENCOUNTER — Telehealth: Payer: Self-pay | Admitting: *Deleted

## 2018-02-13 ENCOUNTER — Ambulatory Visit (INDEPENDENT_AMBULATORY_CARE_PROVIDER_SITE_OTHER): Payer: Medicare Other | Admitting: *Deleted

## 2018-02-13 DIAGNOSIS — Z950 Presence of cardiac pacemaker: Secondary | ICD-10-CM

## 2018-02-13 DIAGNOSIS — I5022 Chronic systolic (congestive) heart failure: Secondary | ICD-10-CM | POA: Diagnosis not present

## 2018-02-13 DIAGNOSIS — I447 Left bundle-branch block, unspecified: Secondary | ICD-10-CM

## 2018-02-13 LAB — CUP PACEART INCLINIC DEVICE CHECK
Battery Voltage: 3.09 V
Brady Statistic AP VP Percent: 0.11 %
Brady Statistic AS VP Percent: 97.99 %
Brady Statistic AS VS Percent: 1.88 %
Brady Statistic RV Percent Paced: 0.67 %
Date Time Interrogation Session: 20190319165357
Implantable Lead Implant Date: 20190304
Implantable Lead Location: 753859
Implantable Lead Model: 4598
Lead Channel Impedance Value: 1007 Ohm
Lead Channel Impedance Value: 1026 Ohm
Lead Channel Impedance Value: 1159 Ohm
Lead Channel Impedance Value: 1292 Ohm
Lead Channel Impedance Value: 551 Ohm
Lead Channel Pacing Threshold Amplitude: 0.5 V
Lead Channel Pacing Threshold Pulse Width: 0.4 ms
Lead Channel Pacing Threshold Pulse Width: 0.4 ms
Lead Channel Sensing Intrinsic Amplitude: 21 mV
Lead Channel Setting Pacing Amplitude: 3.5 V
Lead Channel Setting Pacing Pulse Width: 0.4 ms
Lead Channel Setting Pacing Pulse Width: 0.4 ms
MDC IDC LEAD IMPLANT DT: 20190304
MDC IDC LEAD IMPLANT DT: 20190304
MDC IDC LEAD LOCATION: 753858
MDC IDC LEAD LOCATION: 753860
MDC IDC MSMT BATTERY REMAINING LONGEVITY: 140 mo
MDC IDC MSMT LEADCHNL LV IMPEDANCE VALUE: 1330 Ohm
MDC IDC MSMT LEADCHNL LV IMPEDANCE VALUE: 456 Ohm
MDC IDC MSMT LEADCHNL LV IMPEDANCE VALUE: 855 Ohm
MDC IDC MSMT LEADCHNL RA IMPEDANCE VALUE: 323 Ohm
MDC IDC MSMT LEADCHNL RA IMPEDANCE VALUE: 551 Ohm
MDC IDC MSMT LEADCHNL RA PACING THRESHOLD AMPLITUDE: 1 V
MDC IDC MSMT LEADCHNL RA SENSING INTR AMPL: 0.75 mV
MDC IDC MSMT LEADCHNL RV IMPEDANCE VALUE: 475 Ohm
MDC IDC MSMT LEADCHNL RV PACING THRESHOLD AMPLITUDE: 0.75 V
MDC IDC MSMT LEADCHNL RV PACING THRESHOLD PULSEWIDTH: 0.4 ms
MDC IDC PG IMPLANT DT: 20190304
MDC IDC SET LEADCHNL LV PACING AMPLITUDE: 2 V
MDC IDC SET LEADCHNL RV PACING AMPLITUDE: 3.5 V
MDC IDC SET LEADCHNL RV SENSING SENSITIVITY: 1.2 mV
MDC IDC STAT BRADY AP VS PERCENT: 0.02 %
MDC IDC STAT BRADY RA PERCENT PACED: 0.13 %

## 2018-02-13 NOTE — Progress Notes (Signed)
Wound check appointment. Steri-strips removed. Wound without redness or edema. Stitch noted at medial incision and R lateral incision, both partially removed, antibiotic ointment and bandaid applied. (No allergy to antibiotic ointment per patient.) Incision edges approximated and wound otherwise well healed. Patient instructed to reapply ointment and bandaid x3-4 days after bathing, continue to monitor for signs/symptoms of infection. Patient verbalizes understanding of instructions. Wound recheck on 02/19/18.  Normal device function. P-waves now measure 0.6-0.4mV (vs 1.38mV at implant), impedance and threshold stable, RA sensitivity maintained at 0.57mV, no changes per GT. RV and LV thresholds, sensing, and impedances consistent with implant measurements. Device programmed at 3.5V (RA and RV) with auto capture programmed on for extra safety margin until 3 month visit. Histogram distribution appropriate for patient and level of activity. No mode switches or high ventricular rates noted. Patient educated about wound care, arm mobility, lifting restrictions, and Carelink monitor. ROV with GT on 05/03/18.  Patient assisted to lab for BMET per orders from Darrick Grinder, NP (from 02/06/18), confirmed with Chantel in CHF clinic.

## 2018-02-13 NOTE — Telephone Encounter (Signed)
Spoke with patient to determine if she is able to come in any earlier for her wound check appointment.  She reports her son is taking time off from work so she cannot move it up.  She denies any questions or concerns at this time.

## 2018-02-13 NOTE — Patient Instructions (Addendum)
Apply antibiotic ointment and a bandaid to the incision site where stitches were removed daily after you shower for 4 days.  Wash site with soap and water while you bathe.  Call if you note any signs/symptoms of infection, including drainage, redness, swelling, fever, or chills.

## 2018-02-14 LAB — BASIC METABOLIC PANEL
BUN/Creatinine Ratio: 19 (ref 12–28)
BUN: 21 mg/dL (ref 8–27)
CALCIUM: 9.6 mg/dL (ref 8.7–10.3)
CO2: 26 mmol/L (ref 20–29)
CREATININE: 1.13 mg/dL — AB (ref 0.57–1.00)
Chloride: 92 mmol/L — ABNORMAL LOW (ref 96–106)
GFR calc Af Amer: 52 mL/min/{1.73_m2} — ABNORMAL LOW (ref >59)
GFR, EST NON AFRICAN AMERICAN: 45 mL/min/{1.73_m2} — AB (ref >59)
GLUCOSE: 110 mg/dL — AB (ref 65–99)
Potassium: 5 mmol/L (ref 3.5–5.2)
Sodium: 135 mmol/L (ref 134–144)

## 2018-02-19 ENCOUNTER — Ambulatory Visit: Payer: Medicare Other

## 2018-03-01 ENCOUNTER — Ambulatory Visit (INDEPENDENT_AMBULATORY_CARE_PROVIDER_SITE_OTHER): Payer: Self-pay | Admitting: *Deleted

## 2018-03-01 DIAGNOSIS — I482 Chronic atrial fibrillation, unspecified: Secondary | ICD-10-CM

## 2018-03-01 NOTE — Progress Notes (Addendum)
Wound re check s/p stitches noted on 3/19. Upon assessment today stitch noted at left medial site of incision. Stitch removed. Steri-strips re-applied. Patient instructed to avoid getting site wet for the next week. Wound re-check scheduled for 4/11 at 4:30pm to accomodate son's work schedule.

## 2018-03-06 ENCOUNTER — Encounter (HOSPITAL_COMMUNITY): Payer: Medicare Other

## 2018-03-08 ENCOUNTER — Ambulatory Visit (INDEPENDENT_AMBULATORY_CARE_PROVIDER_SITE_OTHER): Payer: Self-pay | Admitting: *Deleted

## 2018-03-08 DIAGNOSIS — Z950 Presence of cardiac pacemaker: Secondary | ICD-10-CM

## 2018-03-08 NOTE — Progress Notes (Signed)
Steri-strips removed from left chest CRTP pocket. Incision edges approximated, no redness swelling or drainage noted. Patient educated to call back to the [facility] if she notes any of the aforementioned symptoms or notices a stitch at the incision line. She and her son verbalize understanding. Follow up with Dr. Lovena Le 05/03/18 as scheduled.

## 2018-03-12 DIAGNOSIS — I509 Heart failure, unspecified: Secondary | ICD-10-CM | POA: Diagnosis not present

## 2018-03-12 DIAGNOSIS — I519 Heart disease, unspecified: Secondary | ICD-10-CM | POA: Diagnosis not present

## 2018-03-12 DIAGNOSIS — E876 Hypokalemia: Secondary | ICD-10-CM | POA: Diagnosis not present

## 2018-03-12 DIAGNOSIS — Z6828 Body mass index (BMI) 28.0-28.9, adult: Secondary | ICD-10-CM | POA: Diagnosis not present

## 2018-03-12 DIAGNOSIS — E039 Hypothyroidism, unspecified: Secondary | ICD-10-CM | POA: Diagnosis not present

## 2018-03-12 DIAGNOSIS — I10 Essential (primary) hypertension: Secondary | ICD-10-CM | POA: Diagnosis not present

## 2018-03-21 ENCOUNTER — Encounter (HOSPITAL_COMMUNITY): Payer: Medicare Other

## 2018-05-03 ENCOUNTER — Encounter: Payer: Medicare Other | Admitting: Internal Medicine

## 2018-05-16 ENCOUNTER — Other Ambulatory Visit (HOSPITAL_COMMUNITY): Payer: Self-pay | Admitting: Internal Medicine

## 2018-05-21 ENCOUNTER — Encounter: Payer: Medicare Other | Admitting: Internal Medicine

## 2018-06-05 ENCOUNTER — Encounter: Payer: Medicare Other | Admitting: Internal Medicine

## 2018-06-07 ENCOUNTER — Encounter: Payer: Self-pay | Admitting: Cardiology

## 2018-06-07 DIAGNOSIS — G4733 Obstructive sleep apnea (adult) (pediatric): Secondary | ICD-10-CM | POA: Diagnosis not present

## 2018-06-07 DIAGNOSIS — E039 Hypothyroidism, unspecified: Secondary | ICD-10-CM | POA: Diagnosis not present

## 2018-06-07 DIAGNOSIS — I119 Hypertensive heart disease without heart failure: Secondary | ICD-10-CM | POA: Diagnosis not present

## 2018-06-07 DIAGNOSIS — E668 Other obesity: Secondary | ICD-10-CM | POA: Diagnosis not present

## 2018-06-07 DIAGNOSIS — N183 Chronic kidney disease, stage 3 (moderate): Secondary | ICD-10-CM | POA: Diagnosis not present

## 2018-06-07 DIAGNOSIS — I481 Persistent atrial fibrillation: Secondary | ICD-10-CM | POA: Diagnosis not present

## 2018-06-07 DIAGNOSIS — I5022 Chronic systolic (congestive) heart failure: Secondary | ICD-10-CM | POA: Diagnosis not present

## 2018-06-07 DIAGNOSIS — Z7901 Long term (current) use of anticoagulants: Secondary | ICD-10-CM | POA: Diagnosis not present

## 2018-06-07 DIAGNOSIS — I2510 Atherosclerotic heart disease of native coronary artery without angina pectoris: Secondary | ICD-10-CM | POA: Diagnosis not present

## 2018-06-07 NOTE — Progress Notes (Signed)
[name]    Date of visit:  06/07/2018 DOB:  1932/08/25    Age:  84 yrs. Medical record number:  [id number]     Account number:  [id number] Primary Care Provider: Rachell Cipro ____________________________ CURRENT DIAGNOSES  1. Persistent atrial fibrillation  2. Chronic systolic heart failure  3. Hypothyroidism  4. Sleep apnea  5. Hypertensive heart disease without heart failure  6. CAD Native without angina  7. Chronic kidney disease, stage 3 (moderate)  8. Long term (current) use of anticoagulants  9. Obesity ____________________________ ALLERGIES  Ampicillin, Rash  Hydromorphone, Rash  Penicillins, Anaphylaxis ____________________________ MEDICATIONS  1. amiodarone 200 mg tablet, 1 p.o. daily  2. atorvastatin 80 mg tablet, 1 p.o. daily  3. Eliquis 2.5 mg tablet, BID  4. Excedrin Migraine 250 mg-250 mg-65 mg tablet, PRN  5. fexofenadine 180 mg tablet, PRN  6. Fiorinal 50 mg-325 mg-40 mg capsule, PRN  7. Lasix 40 mg tablet, 80 mg p.o. daily and 1 tablet in PM PRN  8. levothyroxine 150 mcg tablet, 1 p.o. daily  9. metoprolol succinate ER 50 mg tablet,extended release 24 hr, 1 p.o. daily  10. Miralax 17 gram oral powder packet, PRN  11. omeprazole 20 mg tablet,delayed release, 1 p.o. daily PRN  12. Senna Laxative-Stool Softener 8.6 mg-50 mg tablet, QHS  13. spironolactone 25 mg tablet, 1/2 tab daily  14. Tylenol 325 mg tablet, PRN ____________________________ CHIEF COMPLAINTS  Followup of CAD Native without angina  Followup of Chronic systolic heart failure ____________________________ HISTORY OF PRESENT ILLNESS  Patient returns for cardiac followup. She had a biventricular pacemaker inserted on March 5. She has really not had much followup since then from cardiology but states that per thyroid has now returned to normal and that she is feeling better. She still has some edema and still has some dyspnea with activity. She has not had followup in the heart failure clinic  since March 12. She has some edema and will take furosemide 80 mg in the morning and occasionally 40 at night. She doesn't have PND orthopnea and does not have angina. She has no bleeding complications anticoagulation. Reports recent lab work done at her primary doctor's office. Appears to know her medicines and states that she is compliant with them. Difficult to tell about functional status. Her weight is up from her previous weight here. ____________________________ PAST HISTORY  Past Medical Illnesses:  hypertension, hyperlipidemia, GERD, cervical disc disease, chronic kidney disease Stage 3, migraine headaches, obesity, hypothyroidism;  Cardiovascular Illnesses:  CAD, paroxysmal atrial fibrillation, LBBB, CHF-biventricular;  Infectious Diseases:  no previous history of significant infectious diseases;  Surgical Procedures:  cystocoele repair, hysterectomy;  Trauma History:  no previous history of significant trauma;  NYHA Classification:  I;  Canadian Angina Classification:  Class 0: Asymptomatic;  Cardiology Procedures-Invasive:  cardiac cath (left) 2003, stent 2003, Biventricular pacer Dr. Lovena Le 01/30/2018, cardioversion;  Cardiology Procedures-Noninvasive:  echocardiogram January 2016, echocardiogram December 2018;  Cardiac Cath Results:  normal left main, 80% mid LAD  2.5 x 13 mm CYpher stent to LAD Dr. Rex Kras, subsequent cath showed patent stent in LAD with no sig disease in RCA or Circ;  Peripheral Vascular Procedures:  no previous invasive peripheral vascular procedures.;  LVEF of 52% documented via echocardiogram on 03/20/2413,   ____________________________ CARDIO-PULMONARY TEST DATES EKG Date:  06/07/2018;   Cardiac Cath Date:  01/20/2003;  Nuclear Study Date:  03/22/2013;  Echocardiography Date: 05/09/2016;  Chest Xray Date: 11/27/2014;   ____________________________ FAMILY HISTORY Brother --  Brother alive with problem, Coronary artery bypass grafting, Coronary Artery Disease Father --  Father dead, Myocardial infarction Mother -- Myocardial infarction, Mother dead Sister -- Sister dead, Pacemaker in situ Sister -- Sister dead, Brain disorder Sister -- Sister dead, Cancer ____________________________ SOCIAL HISTORY Alcohol Use:  does not use alcohol;  Smoking:  nonsmoker;  Diet:  regular diet;  Lifestyle:  divorced and 3 children;  Exercise:  some exercise;  Occupation:  retired;  Residence:  lives alone;   ____________________________ REVIEW OF SYSTEMS General:  obesity, malaise and fatigue Eyes: cataract extraction bilaterally Ears, Nose, Throat, Mouth:  partial hearing loss, hearing aides Respiratory: dyspnea with exertion Cardiovascular:  please review HPI Abdominal: history of GERD and dyspepsiaGenitourinary-Female: recurrent urinary tract infections, hesitancyPsychiatric:  anxiety disorder, depression  ____________________________ PHYSICAL EXAMINATION VITAL SIGNS  Blood Pressure:  140/70 Sitting, Right arm, regular cuff  , 130/70 Standing, Right arm and regular cuff   Pulse:  84/min. Weight:  178.00 lbs. Height:  64"BMI: 30  Constitutional:  pleasant white female, in no acute distress, moderately obese Skin:  occasional ecchymosis present Head:  normocephalic, normal hair pattern, no masses or tenderness Neck:  supple, without massess. No JVD, thyromegaly or carotid bruits. Carotid upstroke normal. Chest:  normal symmetry, clear to auscultation. Cardiac:  irregularly irregular rhythm, normal S1 and S2, no S3 or S4 Peripheral Pulses:  the femoral,dorsalis pedis, and posterior tibial pulses are full and equal bilaterally with no bruits auscultated. Extremities & Back:  2+ edema Neurological:  no gross motor or sensory deficits noted, affect appropriate, oriented x3. ____________________________ MOST RECENT LIPID PANEL 01/26/17  CHOL TOTL 135 mg/dl, LDL 62 NM, HDL 41 mg/dl and TRIGLYCER 166 mg/dl ____________________________ IMPRESSIONS/PLAN  1. Chronic combined  systolic and diastolic heart failure 2. History of atrial fibrillation currently maintaining sinus rhythm 3. Interval insertion of biventricular pacemaker 4. Long-term use of anticoagulation 5. CAD with previous stenting with no angina 6. Chronic kidney disease  Recommendations:  Doesn't appear to have had cardiology followup since March of any kind. She looked better today and really didn't have a lot of edema although she did have some big legs. I want to get another echo on her in 2 months. She states she recently had lab work done at primary [facility] office so will request that. We'll ask EP to followup with her also. ____________________________ TODAYS ORDERS  1. 2D, color flow, doppler: 2 months  2. 12 Lead EKG: Today  3. Return Visit: 2 months  4. Obtain Lab/Xray from PMD                       ____________________________ Cardiology Physician:  Kerry Hough MD [facility]

## 2018-06-11 ENCOUNTER — Telehealth: Payer: Self-pay

## 2018-06-11 NOTE — Telephone Encounter (Signed)
Call placed to Pt. Reminded Pt her 91 day f/u is 06/13/2018 at 4:30 pm.  Pt indicates understanding.  Thanked for call.

## 2018-06-12 DIAGNOSIS — R51 Headache: Secondary | ICD-10-CM | POA: Diagnosis not present

## 2018-06-12 DIAGNOSIS — Z6830 Body mass index (BMI) 30.0-30.9, adult: Secondary | ICD-10-CM | POA: Diagnosis not present

## 2018-06-12 DIAGNOSIS — E039 Hypothyroidism, unspecified: Secondary | ICD-10-CM | POA: Diagnosis not present

## 2018-06-12 DIAGNOSIS — Z23 Encounter for immunization: Secondary | ICD-10-CM | POA: Diagnosis not present

## 2018-06-12 DIAGNOSIS — I519 Heart disease, unspecified: Secondary | ICD-10-CM | POA: Diagnosis not present

## 2018-06-12 DIAGNOSIS — I10 Essential (primary) hypertension: Secondary | ICD-10-CM | POA: Diagnosis not present

## 2018-06-13 ENCOUNTER — Encounter: Payer: Medicare Other | Admitting: Internal Medicine

## 2018-06-22 DIAGNOSIS — E785 Hyperlipidemia, unspecified: Secondary | ICD-10-CM | POA: Diagnosis not present

## 2018-06-22 DIAGNOSIS — I10 Essential (primary) hypertension: Secondary | ICD-10-CM | POA: Diagnosis not present

## 2018-06-22 DIAGNOSIS — N289 Disorder of kidney and ureter, unspecified: Secondary | ICD-10-CM | POA: Diagnosis not present

## 2018-06-22 DIAGNOSIS — E876 Hypokalemia: Secondary | ICD-10-CM | POA: Diagnosis not present

## 2018-06-22 DIAGNOSIS — I509 Heart failure, unspecified: Secondary | ICD-10-CM | POA: Diagnosis not present

## 2018-06-26 DIAGNOSIS — Z79899 Other long term (current) drug therapy: Secondary | ICD-10-CM | POA: Diagnosis not present

## 2018-06-26 DIAGNOSIS — E785 Hyperlipidemia, unspecified: Secondary | ICD-10-CM | POA: Diagnosis not present

## 2018-06-26 DIAGNOSIS — R51 Headache: Secondary | ICD-10-CM | POA: Diagnosis not present

## 2018-06-26 DIAGNOSIS — E039 Hypothyroidism, unspecified: Secondary | ICD-10-CM | POA: Diagnosis not present

## 2018-06-26 DIAGNOSIS — H9193 Unspecified hearing loss, bilateral: Secondary | ICD-10-CM | POA: Diagnosis not present

## 2018-06-26 DIAGNOSIS — Z6831 Body mass index (BMI) 31.0-31.9, adult: Secondary | ICD-10-CM | POA: Diagnosis not present

## 2018-06-26 DIAGNOSIS — Z23 Encounter for immunization: Secondary | ICD-10-CM | POA: Diagnosis not present

## 2018-06-26 DIAGNOSIS — I10 Essential (primary) hypertension: Secondary | ICD-10-CM | POA: Diagnosis not present

## 2018-06-28 ENCOUNTER — Other Ambulatory Visit: Payer: Self-pay

## 2018-07-27 ENCOUNTER — Encounter: Payer: Medicare Other | Admitting: Internal Medicine

## 2018-08-02 ENCOUNTER — Ambulatory Visit (INDEPENDENT_AMBULATORY_CARE_PROVIDER_SITE_OTHER): Payer: Medicare Other | Admitting: *Deleted

## 2018-08-02 DIAGNOSIS — I5022 Chronic systolic (congestive) heart failure: Secondary | ICD-10-CM | POA: Diagnosis not present

## 2018-08-02 DIAGNOSIS — I255 Ischemic cardiomyopathy: Secondary | ICD-10-CM

## 2018-08-02 NOTE — Progress Notes (Signed)
Remote pacemaker transmission.   

## 2018-08-24 ENCOUNTER — Other Ambulatory Visit: Payer: Self-pay | Admitting: Cardiology

## 2018-08-24 ENCOUNTER — Other Ambulatory Visit (HOSPITAL_COMMUNITY): Payer: Self-pay | Admitting: Cardiology

## 2018-08-24 DIAGNOSIS — I5022 Chronic systolic (congestive) heart failure: Secondary | ICD-10-CM

## 2018-08-27 ENCOUNTER — Other Ambulatory Visit (HOSPITAL_COMMUNITY): Payer: Medicare Other

## 2018-08-28 LAB — CUP PACEART REMOTE DEVICE CHECK
Brady Statistic AP VP Percent: 0.75 %
Brady Statistic AP VS Percent: 0.03 %
Brady Statistic AS VP Percent: 97.36 %
Brady Statistic AS VS Percent: 1.86 %
Date Time Interrogation Session: 20190905111023
Implantable Lead Implant Date: 20190304
Implantable Lead Implant Date: 20190304
Implantable Lead Model: 5076
Implantable Pulse Generator Implant Date: 20190304
Lead Channel Impedance Value: 1064 Ohm
Lead Channel Impedance Value: 1083 Ohm
Lead Channel Impedance Value: 1216 Ohm
Lead Channel Impedance Value: 380 Ohm
Lead Channel Impedance Value: 418 Ohm
Lead Channel Impedance Value: 589 Ohm
Lead Channel Impedance Value: 665 Ohm
Lead Channel Impedance Value: 684 Ohm
Lead Channel Impedance Value: 760 Ohm
Lead Channel Impedance Value: 931 Ohm
Lead Channel Pacing Threshold Amplitude: 0.5 V
Lead Channel Pacing Threshold Amplitude: 0.625 V
Lead Channel Pacing Threshold Amplitude: 0.75 V
Lead Channel Pacing Threshold Pulse Width: 0.4 ms
Lead Channel Pacing Threshold Pulse Width: 0.4 ms
Lead Channel Sensing Intrinsic Amplitude: 1.125 mV
Lead Channel Sensing Intrinsic Amplitude: 1.125 mV
Lead Channel Setting Pacing Amplitude: 1.5 V
Lead Channel Setting Pacing Pulse Width: 0.4 ms
Lead Channel Setting Pacing Pulse Width: 0.4 ms
Lead Channel Setting Sensing Sensitivity: 1.2 mV
MDC IDC LEAD IMPLANT DT: 20190304
MDC IDC LEAD LOCATION: 753858
MDC IDC LEAD LOCATION: 753859
MDC IDC LEAD LOCATION: 753860
MDC IDC MSMT BATTERY REMAINING LONGEVITY: 149 mo
MDC IDC MSMT BATTERY VOLTAGE: 3.05 V
MDC IDC MSMT LEADCHNL LV IMPEDANCE VALUE: 1007 Ohm
MDC IDC MSMT LEADCHNL LV IMPEDANCE VALUE: 1235 Ohm
MDC IDC MSMT LEADCHNL LV IMPEDANCE VALUE: 513 Ohm
MDC IDC MSMT LEADCHNL LV IMPEDANCE VALUE: 779 Ohm
MDC IDC MSMT LEADCHNL RV PACING THRESHOLD PULSEWIDTH: 0.4 ms
MDC IDC MSMT LEADCHNL RV SENSING INTR AMPL: 23.75 mV
MDC IDC MSMT LEADCHNL RV SENSING INTR AMPL: 23.75 mV
MDC IDC SET LEADCHNL LV PACING AMPLITUDE: 1.25 V
MDC IDC SET LEADCHNL RV PACING AMPLITUDE: 2 V
MDC IDC STAT BRADY RA PERCENT PACED: 0.77 %
MDC IDC STAT BRADY RV PERCENT PACED: 0.33 %

## 2018-08-30 ENCOUNTER — Ambulatory Visit (HOSPITAL_COMMUNITY): Payer: Medicare Other | Attending: Cardiovascular Disease

## 2018-08-30 ENCOUNTER — Other Ambulatory Visit: Payer: Self-pay

## 2018-08-30 DIAGNOSIS — I2510 Atherosclerotic heart disease of native coronary artery without angina pectoris: Secondary | ICD-10-CM | POA: Insufficient documentation

## 2018-08-30 DIAGNOSIS — I5022 Chronic systolic (congestive) heart failure: Secondary | ICD-10-CM | POA: Diagnosis not present

## 2018-08-30 DIAGNOSIS — G4730 Sleep apnea, unspecified: Secondary | ICD-10-CM | POA: Diagnosis not present

## 2018-08-30 DIAGNOSIS — N189 Chronic kidney disease, unspecified: Secondary | ICD-10-CM | POA: Insufficient documentation

## 2018-08-30 DIAGNOSIS — I071 Rheumatic tricuspid insufficiency: Secondary | ICD-10-CM | POA: Diagnosis not present

## 2018-09-11 ENCOUNTER — Encounter (HOSPITAL_COMMUNITY): Payer: Self-pay | Admitting: Emergency Medicine

## 2018-09-11 ENCOUNTER — Other Ambulatory Visit: Payer: Self-pay

## 2018-09-11 ENCOUNTER — Emergency Department (HOSPITAL_COMMUNITY): Payer: Medicare Other

## 2018-09-11 ENCOUNTER — Inpatient Hospital Stay (HOSPITAL_COMMUNITY)
Admission: EM | Admit: 2018-09-11 | Discharge: 2018-09-18 | DRG: 871 | Disposition: A | Discharge status: Skilled Nursing Facility | Payer: Medicare Other | Attending: Internal Medicine | Admitting: Internal Medicine

## 2018-09-11 DIAGNOSIS — E860 Dehydration: Secondary | ICD-10-CM | POA: Diagnosis present

## 2018-09-11 DIAGNOSIS — S80211D Abrasion, right knee, subsequent encounter: Secondary | ICD-10-CM | POA: Diagnosis not present

## 2018-09-11 DIAGNOSIS — R55 Syncope and collapse: Secondary | ICD-10-CM | POA: Diagnosis not present

## 2018-09-11 DIAGNOSIS — Z90710 Acquired absence of both cervix and uterus: Secondary | ICD-10-CM

## 2018-09-11 DIAGNOSIS — N179 Acute kidney failure, unspecified: Secondary | ICD-10-CM | POA: Diagnosis present

## 2018-09-11 DIAGNOSIS — K5900 Constipation, unspecified: Secondary | ICD-10-CM | POA: Diagnosis present

## 2018-09-11 DIAGNOSIS — Z466 Encounter for fitting and adjustment of urinary device: Secondary | ICD-10-CM | POA: Diagnosis not present

## 2018-09-11 DIAGNOSIS — I130 Hypertensive heart and chronic kidney disease with heart failure and stage 1 through stage 4 chronic kidney disease, or unspecified chronic kidney disease: Secondary | ICD-10-CM | POA: Diagnosis present

## 2018-09-11 DIAGNOSIS — S8001XA Contusion of right knee, initial encounter: Secondary | ICD-10-CM | POA: Diagnosis not present

## 2018-09-11 DIAGNOSIS — E039 Hypothyroidism, unspecified: Secondary | ICD-10-CM | POA: Diagnosis present

## 2018-09-11 DIAGNOSIS — S0003XA Contusion of scalp, initial encounter: Secondary | ICD-10-CM | POA: Diagnosis present

## 2018-09-11 DIAGNOSIS — J9811 Atelectasis: Secondary | ICD-10-CM | POA: Diagnosis not present

## 2018-09-11 DIAGNOSIS — N183 Chronic kidney disease, stage 3 unspecified: Secondary | ICD-10-CM | POA: Diagnosis present

## 2018-09-11 DIAGNOSIS — Y92002 Bathroom of unspecified non-institutional (private) residence single-family (private) house as the place of occurrence of the external cause: Secondary | ICD-10-CM | POA: Diagnosis not present

## 2018-09-11 DIAGNOSIS — I4819 Other persistent atrial fibrillation: Secondary | ICD-10-CM | POA: Diagnosis present

## 2018-09-11 DIAGNOSIS — R0602 Shortness of breath: Secondary | ICD-10-CM | POA: Diagnosis not present

## 2018-09-11 DIAGNOSIS — Z7401 Bed confinement status: Secondary | ICD-10-CM | POA: Diagnosis not present

## 2018-09-11 DIAGNOSIS — I5033 Acute on chronic diastolic (congestive) heart failure: Secondary | ICD-10-CM | POA: Diagnosis present

## 2018-09-11 DIAGNOSIS — R627 Adult failure to thrive: Secondary | ICD-10-CM | POA: Diagnosis present

## 2018-09-11 DIAGNOSIS — I5022 Chronic systolic (congestive) heart failure: Secondary | ICD-10-CM | POA: Diagnosis not present

## 2018-09-11 DIAGNOSIS — A419 Sepsis, unspecified organism: Secondary | ICD-10-CM | POA: Diagnosis not present

## 2018-09-11 DIAGNOSIS — G4730 Sleep apnea, unspecified: Secondary | ICD-10-CM | POA: Diagnosis not present

## 2018-09-11 DIAGNOSIS — N3000 Acute cystitis without hematuria: Secondary | ICD-10-CM

## 2018-09-11 DIAGNOSIS — S0083XA Contusion of other part of head, initial encounter: Secondary | ICD-10-CM | POA: Diagnosis not present

## 2018-09-11 DIAGNOSIS — Z9981 Dependence on supplemental oxygen: Secondary | ICD-10-CM | POA: Diagnosis not present

## 2018-09-11 DIAGNOSIS — M6281 Muscle weakness (generalized): Secondary | ICD-10-CM | POA: Diagnosis not present

## 2018-09-11 DIAGNOSIS — Z79890 Hormone replacement therapy (postmenopausal): Secondary | ICD-10-CM

## 2018-09-11 DIAGNOSIS — H04123 Dry eye syndrome of bilateral lacrimal glands: Secondary | ICD-10-CM | POA: Diagnosis not present

## 2018-09-11 DIAGNOSIS — K219 Gastro-esophageal reflux disease without esophagitis: Secondary | ICD-10-CM | POA: Diagnosis present

## 2018-09-11 DIAGNOSIS — E785 Hyperlipidemia, unspecified: Secondary | ICD-10-CM | POA: Diagnosis present

## 2018-09-11 DIAGNOSIS — N2889 Other specified disorders of kidney and ureter: Secondary | ICD-10-CM | POA: Diagnosis not present

## 2018-09-11 DIAGNOSIS — R609 Edema, unspecified: Secondary | ICD-10-CM | POA: Diagnosis not present

## 2018-09-11 DIAGNOSIS — G4733 Obstructive sleep apnea (adult) (pediatric): Secondary | ICD-10-CM | POA: Diagnosis present

## 2018-09-11 DIAGNOSIS — Z880 Allergy status to penicillin: Secondary | ICD-10-CM

## 2018-09-11 DIAGNOSIS — F40240 Claustrophobia: Secondary | ICD-10-CM | POA: Diagnosis present

## 2018-09-11 DIAGNOSIS — W1811XA Fall from or off toilet without subsequent striking against object, initial encounter: Secondary | ICD-10-CM | POA: Diagnosis present

## 2018-09-11 DIAGNOSIS — S0083XD Contusion of other part of head, subsequent encounter: Secondary | ICD-10-CM | POA: Diagnosis not present

## 2018-09-11 DIAGNOSIS — M2550 Pain in unspecified joint: Secondary | ICD-10-CM | POA: Diagnosis not present

## 2018-09-11 DIAGNOSIS — Z7901 Long term (current) use of anticoagulants: Secondary | ICD-10-CM | POA: Diagnosis not present

## 2018-09-11 DIAGNOSIS — I447 Left bundle-branch block, unspecified: Secondary | ICD-10-CM | POA: Diagnosis not present

## 2018-09-11 DIAGNOSIS — E876 Hypokalemia: Secondary | ICD-10-CM

## 2018-09-11 DIAGNOSIS — R278 Other lack of coordination: Secondary | ICD-10-CM | POA: Diagnosis not present

## 2018-09-11 DIAGNOSIS — Z950 Presence of cardiac pacemaker: Secondary | ICD-10-CM

## 2018-09-11 DIAGNOSIS — R7989 Other specified abnormal findings of blood chemistry: Secondary | ICD-10-CM

## 2018-09-11 DIAGNOSIS — N390 Urinary tract infection, site not specified: Secondary | ICD-10-CM | POA: Diagnosis present

## 2018-09-11 DIAGNOSIS — R110 Nausea: Secondary | ICD-10-CM | POA: Diagnosis not present

## 2018-09-11 DIAGNOSIS — I5030 Unspecified diastolic (congestive) heart failure: Secondary | ICD-10-CM | POA: Diagnosis not present

## 2018-09-11 DIAGNOSIS — R339 Retention of urine, unspecified: Secondary | ICD-10-CM | POA: Diagnosis not present

## 2018-09-11 DIAGNOSIS — I2510 Atherosclerotic heart disease of native coronary artery without angina pectoris: Secondary | ICD-10-CM | POA: Diagnosis present

## 2018-09-11 DIAGNOSIS — R51 Headache: Secondary | ICD-10-CM | POA: Diagnosis not present

## 2018-09-11 DIAGNOSIS — S80211A Abrasion, right knee, initial encounter: Secondary | ICD-10-CM | POA: Diagnosis present

## 2018-09-11 DIAGNOSIS — Z79899 Other long term (current) drug therapy: Secondary | ICD-10-CM

## 2018-09-11 DIAGNOSIS — R6520 Severe sepsis without septic shock: Secondary | ICD-10-CM | POA: Diagnosis not present

## 2018-09-11 DIAGNOSIS — B9620 Unspecified Escherichia coli [E. coli] as the cause of diseases classified elsewhere: Secondary | ICD-10-CM | POA: Diagnosis present

## 2018-09-11 DIAGNOSIS — I480 Paroxysmal atrial fibrillation: Secondary | ICD-10-CM | POA: Diagnosis not present

## 2018-09-11 DIAGNOSIS — S0990XA Unspecified injury of head, initial encounter: Secondary | ICD-10-CM | POA: Diagnosis not present

## 2018-09-11 DIAGNOSIS — R2681 Unsteadiness on feet: Secondary | ICD-10-CM | POA: Diagnosis not present

## 2018-09-11 DIAGNOSIS — I10 Essential (primary) hypertension: Secondary | ICD-10-CM | POA: Diagnosis not present

## 2018-09-11 LAB — CBC
HCT: 47.4 % — ABNORMAL HIGH (ref 36.0–46.0)
Hemoglobin: 15 g/dL (ref 12.0–15.0)
MCH: 30.1 pg (ref 26.0–34.0)
MCHC: 31.6 g/dL (ref 30.0–36.0)
MCV: 95.2 fL (ref 80.0–100.0)
NRBC: 0 % (ref 0.0–0.2)
PLATELETS: 322 10*3/uL (ref 150–400)
RBC: 4.98 MIL/uL (ref 3.87–5.11)
RDW: 13.3 % (ref 11.5–15.5)
WBC: 38 10*3/uL — AB (ref 4.0–10.5)

## 2018-09-11 LAB — LACTIC ACID, PLASMA
LACTIC ACID, VENOUS: 3.1 mmol/L — AB (ref 0.5–1.9)
Lactic Acid, Venous: 1.7 mmol/L (ref 0.5–1.9)

## 2018-09-11 LAB — URINALYSIS, ROUTINE W REFLEX MICROSCOPIC
GLUCOSE, UA: NEGATIVE mg/dL
HGB URINE DIPSTICK: NEGATIVE
KETONES UR: 5 mg/dL — AB
NITRITE: NEGATIVE
PH: 5 (ref 5.0–8.0)
PROTEIN: NEGATIVE mg/dL
Specific Gravity, Urine: 1.02 (ref 1.005–1.030)

## 2018-09-11 LAB — I-STAT TROPONIN, ED: TROPONIN I, POC: 0.01 ng/mL (ref 0.00–0.08)

## 2018-09-11 LAB — BASIC METABOLIC PANEL
Anion gap: 20 — ABNORMAL HIGH (ref 5–15)
BUN: 29 mg/dL — ABNORMAL HIGH (ref 8–23)
CALCIUM: 10.1 mg/dL (ref 8.9–10.3)
CHLORIDE: 95 mmol/L — AB (ref 98–111)
CO2: 24 mmol/L (ref 22–32)
CREATININE: 1.98 mg/dL — AB (ref 0.44–1.00)
GFR, EST AFRICAN AMERICAN: 26 mL/min — AB (ref >60)
GFR, EST NON AFRICAN AMERICAN: 22 mL/min — AB (ref >60)
Glucose, Bld: 213 mg/dL — ABNORMAL HIGH (ref 70–99)
Potassium: 3.3 mmol/L — ABNORMAL LOW (ref 3.5–5.1)
SODIUM: 139 mmol/L (ref 135–145)

## 2018-09-11 LAB — CK: CK TOTAL: 58 U/L (ref 38–234)

## 2018-09-11 LAB — MAGNESIUM: MAGNESIUM: 1.7 mg/dL (ref 1.7–2.4)

## 2018-09-11 LAB — TROPONIN I
TROPONIN I: 0.03 ng/mL — AB (ref <0.03)
Troponin I: 0.03 ng/mL (ref <0.03)

## 2018-09-11 LAB — CBG MONITORING, ED: Glucose-Capillary: 216 mg/dL — ABNORMAL HIGH (ref 70–99)

## 2018-09-11 MED ORDER — INFLUENZA VAC SPLIT HIGH-DOSE 0.5 ML IM SUSY
0.5000 mL | PREFILLED_SYRINGE | INTRAMUSCULAR | Status: AC
  Filled 2018-09-11 (×3): qty 0.5

## 2018-09-11 MED ORDER — SODIUM CHLORIDE 0.9 % IV BOLUS
1000.0000 mL | Freq: Once | INTRAVENOUS | Status: AC
  Administered 2018-09-11: 1000 mL via INTRAVENOUS

## 2018-09-11 MED ORDER — METOPROLOL SUCCINATE ER 50 MG PO TB24
50.0000 mg | ORAL_TABLET | Freq: Every day | ORAL | Status: DC
  Administered 2018-09-11 – 2018-09-12 (×2): 50 mg via ORAL
  Filled 2018-09-11 (×2): qty 1

## 2018-09-11 MED ORDER — CIPROFLOXACIN IN D5W 400 MG/200ML IV SOLN
400.0000 mg | Freq: Once | INTRAVENOUS | Status: AC
  Administered 2018-09-11: 400 mg via INTRAVENOUS
  Filled 2018-09-11: qty 200

## 2018-09-11 MED ORDER — POTASSIUM CHLORIDE CRYS ER 20 MEQ PO TBCR
40.0000 meq | EXTENDED_RELEASE_TABLET | Freq: Once | ORAL | Status: AC
  Administered 2018-09-11: 40 meq via ORAL
  Filled 2018-09-11: qty 2

## 2018-09-11 MED ORDER — ACETAMINOPHEN 325 MG PO TABS
650.0000 mg | ORAL_TABLET | Freq: Four times a day (QID) | ORAL | Status: DC | PRN
  Administered 2018-09-11 – 2018-09-17 (×10): 650 mg via ORAL
  Filled 2018-09-11 (×11): qty 2

## 2018-09-11 MED ORDER — ONDANSETRON HCL 4 MG/2ML IJ SOLN: 4.0000 mg | Freq: Four times a day (QID) | INTRAMUSCULAR | Status: DC | PRN

## 2018-09-11 MED ORDER — ATORVASTATIN CALCIUM 80 MG PO TABS
80.0000 mg | ORAL_TABLET | Freq: Every day | ORAL | Status: DC
  Administered 2018-09-11 – 2018-09-18 (×8): 80 mg via ORAL
  Filled 2018-09-11 (×9): qty 1

## 2018-09-11 MED ORDER — SODIUM CHLORIDE 0.9 % IV SOLN
1.0000 g | Freq: Two times a day (BID) | INTRAVENOUS | Status: DC
  Administered 2018-09-11 – 2018-09-13 (×5): 1 g via INTRAVENOUS
  Filled 2018-09-11 (×6): qty 1

## 2018-09-11 MED ORDER — PANTOPRAZOLE SODIUM 20 MG PO TBEC: 20.0000 mg | DELAYED_RELEASE_TABLET | Freq: Every day | ORAL | Status: DC | PRN

## 2018-09-11 MED ORDER — ACETAMINOPHEN 650 MG RE SUPP: 650.0000 mg | Freq: Four times a day (QID) | RECTAL | Status: DC | PRN

## 2018-09-11 MED ORDER — SODIUM CHLORIDE 0.9 % IV SOLN
INTRAVENOUS | Status: AC
  Administered 2018-09-11 – 2018-09-12 (×2): via INTRAVENOUS

## 2018-09-11 MED ORDER — APIXABAN 2.5 MG PO TABS
2.5000 mg | ORAL_TABLET | Freq: Two times a day (BID) | ORAL | Status: DC
  Administered 2018-09-11 – 2018-09-18 (×15): 2.5 mg via ORAL
  Filled 2018-09-11 (×15): qty 1

## 2018-09-11 MED ORDER — POLYETHYLENE GLYCOL 3350 17 G PO PACK: 17.0000 g | PACK | Freq: Every day | ORAL | Status: DC | PRN

## 2018-09-11 MED ORDER — AMIODARONE HCL 200 MG PO TABS
200.0000 mg | ORAL_TABLET | Freq: Every day | ORAL | Status: DC
  Administered 2018-09-11 – 2018-09-18 (×8): 200 mg via ORAL
  Filled 2018-09-11 (×8): qty 1

## 2018-09-11 MED ORDER — SENNOSIDES-DOCUSATE SODIUM 8.6-50 MG PO TABS
1.0000 | ORAL_TABLET | Freq: Every evening | ORAL | Status: DC | PRN
  Administered 2018-09-14: 1 via ORAL

## 2018-09-11 MED ORDER — ONDANSETRON HCL 4 MG PO TABS
4.0000 mg | ORAL_TABLET | Freq: Four times a day (QID) | ORAL | Status: DC | PRN
  Administered 2018-09-13 – 2018-09-16 (×4): 4 mg via ORAL
  Filled 2018-09-11 (×4): qty 1

## 2018-09-11 MED ORDER — FENTANYL CITRATE (PF) 100 MCG/2ML IJ SOLN
50.0000 ug | Freq: Once | INTRAMUSCULAR | Status: AC
  Administered 2018-09-11: 50 ug via INTRAVENOUS
  Filled 2018-09-11: qty 2

## 2018-09-11 MED ORDER — ONDANSETRON HCL 4 MG/2ML IJ SOLN
4.0000 mg | Freq: Once | INTRAMUSCULAR | Status: AC
  Administered 2018-09-11: 4 mg via INTRAVENOUS
  Filled 2018-09-11: qty 2

## 2018-09-11 MED ORDER — MAGNESIUM SULFATE 2 GM/50ML IV SOLN
2.0000 g | Freq: Once | INTRAVENOUS | Status: AC
  Administered 2018-09-11: 2 g via INTRAVENOUS
  Filled 2018-09-11: qty 50

## 2018-09-11 MED ORDER — SODIUM CHLORIDE 0.9 % IV BOLUS
500.0000 mL | Freq: Once | INTRAVENOUS | Status: AC
  Administered 2018-09-11: 500 mL via INTRAVENOUS

## 2018-09-11 MED ORDER — LEVOTHYROXINE SODIUM 100 MCG PO TABS
200.0000 ug | ORAL_TABLET | Freq: Every day | ORAL | Status: DC
  Administered 2018-09-12 – 2018-09-18 (×7): 200 ug via ORAL
  Filled 2018-09-11 (×7): qty 2

## 2018-09-11 NOTE — Evaluation (Signed)
Physical Therapy Evaluation Patient Details Name: [name] MRN: [id number] DOB: [birth year] Today's Date: 09/11/2018   History of Present Illness  Pt is an 84 y/o female admitted following syncopal episode. Pt with likely sepsis secondary to UTI. CT of head and c-spine negative for acute abnormality. R knee imaging revealed soft tissue hematoma. PMH includes a fib, CKD, CAD, CHF, HTN, and s/p pacemaker.   Clinical Impression  Pt admitted secondary to problem above with deficits below. Pt with poor activity tolerance secondary to pain, weakness, and lightheadedness. Attempted orthostatics, however, unable to get standing BP, as pt only able to tolerate a few seconds in standing. Noted drop in SBP from supine to sitting; see vitals flowsheet. Pt very unaware of current deficits and reports she lives alone. Reports daughter in law and son can stop in occasionally, however, cannot provide 24/7 support. Feel pt is at increased risk for falls. Will continue to follow acutely to maximize functional mobility independence and safety.      Follow Up Recommendations SNF;Supervision/Assistance - 24 hour    Equipment Recommendations  None recommended by PT    Recommendations for Other Services OT consult     Precautions / Restrictions Precautions Precautions: Fall;Other (comment) Precaution Comments: Watch BP  Restrictions Weight Bearing Restrictions: No      Mobility  Bed Mobility Overal bed mobility: Needs Assistance Bed Mobility: Supine to Sit;Sit to Supine     Supine to sit: Supervision Sit to supine: Supervision   General bed mobility comments: Supervision and increased time required. Pt reports feeling nauseous upon sitting. Noted drop in SBP upon sitting; see vitals flowsheet.   Transfers Overall transfer level: Needs assistance Equipment used: None Transfers: Sit to/from Stand Sit to Stand: Min guard         General transfer comment: Min guard for safety. Pt only able  to tolerate a few seconds in standing before requesting to sit down. Unable to obtain BP in standing.   Ambulation/Gait             General Gait Details: Unable   Stairs            Wheelchair Mobility    Modified Rankin (Stroke Patients Only)       Balance Overall balance assessment: Needs assistance Sitting-balance support: No upper extremity supported;Feet supported Sitting balance-Leahy Scale: Fair     Standing balance support: No upper extremity supported;During functional activity Standing balance-Leahy Scale: Fair                               Pertinent Vitals/Pain Pain Assessment: Faces Faces Pain Scale: Hurts little more Pain Location: back, R knee  Pain Descriptors / Indicators: Guarding;Grimacing Pain Intervention(s): Limited activity within patient's tolerance;Monitored during session;Repositioned    Home Living Family/patient expects to be discharged to:: Private residence Living Arrangements: Alone Available Help at Discharge: Family;Available PRN/intermittently Type of Home: House Home Access: Stairs to enter Entrance Stairs-Rails: Right;Left;Can reach both Entrance Stairs-Number of Steps: 5 Home Layout: One level Home Equipment: Clinical cytogeneticist - 4 wheels;Bedside commode      Prior Function Level of Independence: Needs assistance   Gait / Transfers Assistance Needed: Reports she is independent with ambulation.   ADL's / Homemaking Assistance Needed: Reports she can perform ADLs, however, pt's daughter in law cooks and cleans.         Hand Dominance   Dominant Hand: Right    Extremity/Trunk Assessment  Upper Extremity Assessment Upper Extremity Assessment: Defer to OT evaluation    Lower Extremity Assessment Lower Extremity Assessment: Generalized weakness;RLE deficits/detail RLE Deficits / Details: R knee hematoma     Cervical / Trunk Assessment Cervical / Trunk Assessment: Other exceptions Cervical /  Trunk Exceptions: L forehead bruise noted   Communication   Communication: HOH  Cognition Arousal/Alertness: Awake/alert Behavior During Therapy: WFL for tasks assessed/performed Overall Cognitive Status: No family/caregiver present to determine baseline cognitive functioning                                 General Comments: Pt very unaware of deficits and inability to perform necessary ADLs. When asked how she would complete mobility/ADL tasks, pt repeated that her daughter in law cooked and cleaned for her.       General Comments General comments (skin integrity, edema, etc.): See vitals flowsheet for orthostatic BP's. Unable to tolerate full orthostatic testing.     Exercises     Assessment/Plan    PT Assessment Patient needs continued PT services  PT Problem List Decreased strength;Decreased balance;Decreased mobility;Decreased knowledge of use of DME;Decreased activity tolerance;Decreased knowledge of precautions;Decreased cognition;Decreased safety awareness       PT Treatment Interventions DME instruction;Stair training;Gait training;Functional mobility training;Therapeutic exercise;Therapeutic activities;Balance training;Patient/family education    PT Goals (Current goals can be found in the Care Plan section)  Acute Rehab PT Goals Patient Stated Goal: to go home  PT Goal Formulation: With patient Time For Goal Achievement: 09/25/18 Potential to Achieve Goals: Good    Frequency Min 2X/week   Barriers to discharge Decreased caregiver support      Co-evaluation               AM-PAC PT "6 Clicks" Daily Activity  Outcome Measure Difficulty turning over in bed (including adjusting bedclothes, sheets and blankets)?: A Lot Difficulty moving from lying on back to sitting on the side of the bed? : A Lot Difficulty sitting down on and standing up from a chair with arms (e.g., wheelchair, bedside commode, etc,.)?: Unable Help needed moving to and from a  bed to chair (including a wheelchair)?: A Lot Help needed walking in hospital room?: A Lot Help needed climbing 3-5 steps with a railing? : A Lot 6 Click Score: 11    End of Session   Activity Tolerance: Treatment limited secondary to medical complications (Comment)(lightheadedness ) Patient left: in bed;with call bell/phone within reach;with bed alarm set Nurse Communication: Mobility status PT Visit Diagnosis: Other abnormalities of gait and mobility (R26.89);Unsteadiness on feet (R26.81);Muscle weakness (generalized) (M62.81);History of falling (Z91.81)    Time: 6195-0932 PT Time Calculation (min) (ACUTE ONLY): 21 min   Charges:   PT Evaluation $PT Eval Moderate Complexity: [place], PT, DPT  Acute Rehabilitation Services  Pager: [phone] Office: [phone]   Rudean Hitt 09/11/2018, 11:16 AM

## 2018-09-11 NOTE — Progress Notes (Signed)
Pharmacy Antibiotic Note  [name] is a 84 y.o. female admitted on 09/11/2018 with UTI.   Plan: Meropenem 1 g q12h Monitor renal fx cx lot  Height: 5\' 4"  (162.6 cm) Weight: 171 lb 1.6 oz (77.6 kg) IBW/kg (Calculated) : 54.7  Temp (24hrs), Avg:98.3 F (36.8 C), Min:98.1 F (36.7 C), Max:98.4 F (36.9 C)  Recent Labs  Lab 09/11/18 0120  WBC 38.0*  CREATININE 1.98*    Estimated Creatinine Clearance: 21.3 mL/min (A) (by C-G formula based on SCr of 1.98 mg/dL (H)).    Allergies  Allergen Reactions  . Ampicillin Shortness Of Breath, Swelling and Rash  . Hydromorphone Hcl Shortness Of Breath, Itching, Swelling and Rash  . Penicillins Anaphylaxis and Other (See Comments)    Has patient had a PCN reaction causing immediate rash, facial/tongue/throat swelling, SOB or lightheadedness with hypotension: Yes Has patient had a PCN reaction causing severe rash involving mucus membranes or skin necrosis: Unknown Has patient had a PCN reaction that required hospitalization: Yes (was in the hosp) Has patient had a PCN reaction occurring within the last 10 years: No If all of the above answers are "NO", then may proceed with Cephalosporin use.    Levester Fresh, PharmD, BCPS, BCCCP Clinical Pharmacist [phone]  Please check AMION for all [place] numbers  09/11/2018 8:14 AM

## 2018-09-11 NOTE — ED Notes (Signed)
Patient sat drop while sleeping to 79-80, placed on 3 L N/C increased sats to 94%.

## 2018-09-11 NOTE — ED Triage Notes (Signed)
Pt presents from home with [facility] for syncopal episode that occurred when she stood from toilet; pt states she was unconscious for unnkown period of time and family found her and got her out of the floor; EMS reports pt was very pale on scene with large hematoma to L head and R knee abrasion/bruise; pt is on ELIQUIS;

## 2018-09-11 NOTE — Progress Notes (Signed)
CRITICAL VALUE ALERT  Critical Value:  Lactic acid 3.1  Date & Time Notied:  09/11/18 1029  Provider Notified: Dr. Loleta Books  Orders Received/Actions taken:

## 2018-09-11 NOTE — Progress Notes (Signed)
Pt stated that she dose not wear CPAP at home and did use oxygen at night but no longer uses it and does not have it at home. Declined use of CPAP at this time.

## 2018-09-11 NOTE — ED Notes (Signed)
Lab to add on CK 

## 2018-09-11 NOTE — ED Provider Notes (Signed)
[facility] Provider Note   CSN: [id number] Arrival date & time: 09/11/18  0104     History   Chief Complaint Chief Complaint  Patient presents with  . Loss of Consciousness  . Head Injury    [name] is a 84 y.o. female.  Mikayla  This is an 84 year old female with a history of coronary artery disease, CHF, hyperlipidemia who presents with an episode of syncope.  Patient reports that she remembers going to the bathroom around 6:30 PM.  The last thing she remembers is sitting on the toilet.  She woke up on the floor.  She crawled to the phone in the living room and called her granddaughter.  Her granddaughter reports that she received a phone call around 11:15 PM.  Patient has no known history of post micturition syncope.  She states that she did not feel dizzy.  No chest pain or shortness of breath.  Denies any recent illnesses or fevers.  Granddaughter noted that she appeared somewhat winded when they were able to get her up.  She did appear to have had a bowel movement and an episode of vomiting.  Patient now is able to provide most of the history.  She is only complaining of headache.  Denies any hip or leg pain.  She does report nausea.  She was ambulatory.  She is on Eliquis.  Past Medical History:  Diagnosis Date  . Anxiety    "some; not treated for it" (11/27/2014)  . Bradycardia   . CAD (coronary artery disease), native coronary artery    2003 normal left main, 80% mid LAD  2.5 x 13 mm Cypher stent to LAD Dr. Rex Kras, subsequent cath 2004 showed patent stent in LAD with no sig disease in RCA or Circ;    . CHF (congestive heart failure) ([place])   . CKD (chronic kidney disease), stage III ([place])   . GERD (gastroesophageal reflux disease)   . History of hiatal hernia   . Hyperlipidemia   . Hypertension   . LBBB (left bundle branch block)   . Migraine    "very rare now" (11/27/2014)  . Obesity (BMI 30-39.9) 06/18/2017  . On home  oxygen therapy    "only at night; ? setting" (11/27/2014)  . Osteoarthritis cervical spine   . Sleep apnea 06/18/2017    Patient Active Problem List   Diagnosis Date Noted  . Anemia in chronic kidney disease 12/14/2017  . Metabolic alkalosis 69/62/9528  . Chronic systolic heart failure ([place])   . Hypothyroidism   . Myxedema   . Pleural effusion, right   . Current use of long term anticoagulation 06/18/2017  . Sleep apnea 06/18/2017  . Obesity (BMI 30-39.9) 06/18/2017  . LBBB (left bundle branch block) 06/18/2017  . CKD (chronic kidney disease), stage III ([place])   . Persistent atrial fibrillation   . Dyspnea 11/27/2014  . Fatigue 11/27/2014  . Hyperlipidemia 02/14/2011  . Anxiety state 02/14/2011  . Depressive disorder 02/14/2011  . Hypertensive heart disease without CHF 02/14/2011  . CAD (coronary artery disease), native coronary artery 02/14/2011    Past Surgical History:  Procedure Laterality Date  . ABDOMINAL HYSTERECTOMY    . BIV PACEMAKER INSERTION CRT-P N/A 01/29/2018   Procedure: BIV PACEMAKER INSERTION CRT-P;  Surgeon: Evans Lance, MD;  Location: [facility];  Service: Cardiovascular;  Laterality: N/A;  . CARDIOVERSION N/A 12/05/2017   Procedure: CARDIOVERSION;  Surgeon: Jolaine Artist, MD;  Location:  [facility];  Service: Cardiovascular;  Laterality: N/A;  . CATARACT EXTRACTION W/ INTRAOCULAR LENS  IMPLANT, BILATERAL Bilateral   . CYSTOCELE REPAIR       OB History   None      Home Medications    Prior to Admission medications   Medication Sig Start Date End Date Taking? Authorizing Provider  acetaminophen (TYLENOL) 325 MG tablet Take 2 tablets (650 mg total) by mouth every 6 (six) hours as needed for mild pain (or Fever >/= 101). 12/02/14   Kelvin Cellar, MD  amiodarone (PACERONE) 200 MG tablet Take 1 tablet (200 mg total) by mouth daily. 06/22/17   Florencia Reasons, MD  apixaban (ELIQUIS) 2.5 MG TABS tablet Take 1 tablet (2.5 mg total) by mouth 2 (two)  times daily. 12/08/17   Ina Homes, MD  atorvastatin (LIPITOR) 80 MG tablet Take 1 tablet (80 mg total) by mouth daily. 06/22/17   Florencia Reasons, MD  butalbital-acetaminophen-caffeine (FIORICET, ESGIC) [phone] MG tablet Take 1 tablet by mouth every 4 (four) hours as needed for headache or migraine.     [provider]  furosemide (LASIX) 40 MG tablet TAKE 2 TABLETS BY MOUTH DAILY 05/16/18   Bensimhon, Shaune Pascal, MD  levothyroxine (SYNTHROID, LEVOTHROID) 200 MCG tablet Take 1 tablet (200 mcg total) by mouth daily before breakfast. 12/09/17   Ina Homes, MD  metoprolol succinate (TOPROL-XL) 50 MG 24 hr tablet Take 1 tablet (50 mg total) by mouth daily. Take with or immediately following a meal. 12/09/17   Helberg, Larkin Ina, MD  omeprazole (PRILOSEC OTC) 20 MG tablet Take 20 mg by mouth daily as needed (for reflux).    [provider]  polyethylene glycol (MIRALAX / GLYCOLAX) packet Take 17 g by mouth daily. Patient taking differently: Take 17 g by mouth daily as needed for moderate constipation.  06/22/17   Florencia Reasons, MD  senna-docusate (SENOKOT-S) 8.6-50 MG tablet Take 1 tablet by mouth at bedtime. Patient taking differently: Take 1 tablet by mouth at bedtime as needed for mild constipation.  06/21/17   Florencia Reasons, MD  spironolactone (ALDACTONE) 25 MG tablet Take 1 tablet (25 mg total) by mouth daily. 02/06/18   Conrad [place], NP    Family History Family History  Problem Relation Age of Onset  . Heart attack Mother   . Hypertension Mother   . Heart Problems Mother   . Heart attack Father   . Hypertension Father     Social History Social History   Tobacco Use  . Smoking status: Never Smoker  . Smokeless tobacco: Never Used  Substance Use Topics  . Alcohol use: No    Alcohol/week: 0.0 standard drinks  . Drug use: No     Allergies   Ampicillin; Hydromorphone hcl; and Penicillins   Review of Systems Review of Systems  Constitutional: Negative for fever.  Respiratory:  Negative for cough and shortness of breath.   Cardiovascular: Negative for chest pain.  Gastrointestinal: Positive for nausea and vomiting. Negative for abdominal pain and diarrhea.  Genitourinary: Negative for dysuria.  Skin: Positive for wound.  Neurological: Positive for syncope. Negative for dizziness.  All other systems reviewed and are negative.    Physical Exam Updated Vital Signs BP (!) 108/47 (BP Location: Right Arm)   Pulse 86   Temp 98.1 F (36.7 C) (Oral)   Resp 18   Ht 1.638 m (5' 4.5")   Wt 80.7 kg   SpO2 92%   BMI 30.08 kg/m   Physical Exam  Constitutional: She is oriented to person, place, and time.  Elderly, overweight, nontoxic-appearing  HENT:  Head: Normocephalic.  Mouth/Throat: Oropharynx is clear and moist.  Large hematoma noted over the left forehead  Eyes: Pupils are equal, round, and reactive to light. EOM are normal.  Neck: Normal range of motion. Neck supple.  Cardiovascular: Normal rate, regular rhythm and normal heart sounds.  Pulmonary/Chest: Effort normal and breath sounds normal. No respiratory distress. She has no wheezes.  Abdominal: Soft. Bowel sounds are normal. There is no tenderness. There is no rebound and no guarding.  Musculoskeletal:  Swelling with ecchymosis and abrasion noted over the right knee, normal range of motion, normal range of motion of the bilateral hips without deformity or pain  Neurological: She is alert and oriented to person, place, and time.  Cranial nerves II through XII intact, 5 out of 5 strength in all 4 extremities, alert and oriented x3  Skin: Skin is warm and dry.  Psychiatric: She has a normal mood and affect.  Nursing note and vitals reviewed.    ED Treatments / Results  Labs (all labs ordered are listed, but only abnormal results are displayed) Labs Reviewed  BASIC METABOLIC PANEL - Abnormal; Notable for the following components:      Result Value   Potassium 3.3 (*)    Chloride 95 (*)     Glucose, Bld 213 (*)    BUN 29 (*)    Creatinine, Ser 1.98 (*)    GFR calc non Af Amer 22 (*)    GFR calc Af Amer 26 (*)    Anion gap 20 (*)    All other components within normal limits  CBC - Abnormal; Notable for the following components:   WBC 38.0 (*)    HCT 47.4 (*)    All other components within normal limits  URINALYSIS, ROUTINE W REFLEX MICROSCOPIC - Abnormal; Notable for the following components:   Color, Urine AMBER (*)    APPearance HAZY (*)    Bilirubin Urine SMALL (*)    Ketones, ur 5 (*)    Leukocytes, UA MODERATE (*)    Bacteria, UA MANY (*)    All other components within normal limits  CBG MONITORING, ED - Abnormal; Notable for the following components:   Glucose-Capillary 216 (*)    All other components within normal limits  URINE CULTURE  CK  I-STAT TROPONIN, ED    EKG EKG Interpretation  Date/Time:  Tuesday September 11 2018 01:13:48 EDT Ventricular Rate:  102 PR Interval:    QRS Duration: 97 QT Interval:  359 QTC Calculation: 468 R Axis:   -73 Text Interpretation:  Atrial-sensed ventricular-paced rhythm No further analysis attempted due to paced rhythm Artifact in lead(s) V2 Confirmed by Thayer Jew [phone]) on 09/11/2018 3:06:18 AM   Radiology Dg Chest 2 View  Result Date: 09/11/2018 CLINICAL DATA:  Increased WBC EXAM: CHEST - 2 VIEW COMPARISON:  01/30/2018 FINDINGS: Left-sided multi lead pacing device as before. Chronic elevation of right diaphragm with subsegmental atelectasis. No focal airspace disease or pleural effusion. Stable cardiomediastinal silhouette with aortic atherosclerosis. No pneumothorax. IMPRESSION: No active cardiopulmonary disease. Chronic elevation of the right diaphragm with subsegmental atelectasis. Electronically Signed   By: Donavan Foil M.D.   On: 09/11/2018 02:35   Ct Head Wo Contrast  Result Date: 09/11/2018 CLINICAL DATA:  Syncopal episode hematoma to the head EXAM: CT HEAD WITHOUT CONTRAST CT CERVICAL SPINE  WITHOUT CONTRAST TECHNIQUE: Multidetector CT imaging of the head and cervical  spine was performed following the standard protocol without intravenous contrast. Multiplanar CT image reconstructions of the cervical spine were also generated. COMPARISON:  CT brain 11/24/2017, MRI 01/05/2010 FINDINGS: CT HEAD FINDINGS Brain: No acute territorial infarction, hemorrhage or intracranial mass. Moderate atrophy. Mild small vessel ischemic changes of the white matter. Chronic lacunar infarct in the left basal ganglia. Stable ventricle size. Vascular: No hyperdense vessels. Vertebral artery and carotid vascular calcification Skull: Normal. Negative for fracture or focal lesion. Sinuses/Orbits: No acute finding. Other: Moderate left frontal scalp hematoma CT CERVICAL SPINE FINDINGS Alignment: No subluxation.  Facet alignment is within normal limits. Skull base and vertebrae: No acute fracture. No primary bone lesion or focal pathologic process. Soft tissues and spinal canal: No prevertebral fluid or swelling. No visible canal hematoma. Disc levels: Moderate degenerative changes C3-C4, C5-C6. Multiple level bilateral left greater than right hypertrophic facet degenerative change. Upper chest: Negative. Other: None IMPRESSION: 1. No CT evidence for acute intracranial abnormality. Atrophy and small vessel ischemic changes of the white matter 2. Degenerative changes of the cervical spine. No acute osseous abnormality Electronically Signed   By: Donavan Foil M.D.   On: 09/11/2018 02:34   Ct Cervical Spine Wo Contrast  Result Date: 09/11/2018 CLINICAL DATA:  Syncopal episode hematoma to the head EXAM: CT HEAD WITHOUT CONTRAST CT CERVICAL SPINE WITHOUT CONTRAST TECHNIQUE: Multidetector CT imaging of the head and cervical spine was performed following the standard protocol without intravenous contrast. Multiplanar CT image reconstructions of the cervical spine were also generated. COMPARISON:  CT brain 11/24/2017, MRI 01/05/2010  FINDINGS: CT HEAD FINDINGS Brain: No acute territorial infarction, hemorrhage or intracranial mass. Moderate atrophy. Mild small vessel ischemic changes of the white matter. Chronic lacunar infarct in the left basal ganglia. Stable ventricle size. Vascular: No hyperdense vessels. Vertebral artery and carotid vascular calcification Skull: Normal. Negative for fracture or focal lesion. Sinuses/Orbits: No acute finding. Other: Moderate left frontal scalp hematoma CT CERVICAL SPINE FINDINGS Alignment: No subluxation.  Facet alignment is within normal limits. Skull base and vertebrae: No acute fracture. No primary bone lesion or focal pathologic process. Soft tissues and spinal canal: No prevertebral fluid or swelling. No visible canal hematoma. Disc levels: Moderate degenerative changes C3-C4, C5-C6. Multiple level bilateral left greater than right hypertrophic facet degenerative change. Upper chest: Negative. Other: None IMPRESSION: 1. No CT evidence for acute intracranial abnormality. Atrophy and small vessel ischemic changes of the white matter 2. Degenerative changes of the cervical spine. No acute osseous abnormality Electronically Signed   By: Donavan Foil M.D.   On: 09/11/2018 02:34   Dg Knee Complete 4 Views Right  Result Date: 09/11/2018 CLINICAL DATA:  Syncopal episode. Hematoma to the right knee. EXAM: RIGHT KNEE - COMPLETE 4+ VIEW COMPARISON:  None. FINDINGS: Right knee appears intact. No evidence of acute fracture or subluxation. No focal bone lesion or bone destruction. Bone cortex and trabecular architecture appear intact. No radiopaque soft tissue foreign bodies. Infiltration and swelling of the subcutaneous fat anterior to the infrapatellar region suggesting hematoma. IMPRESSION: Soft tissue hematoma. No acute bony abnormalities. Electronically Signed   By: Lucienne Capers M.D.   On: 09/11/2018 02:21    Procedures Procedures (including critical care time)  CRITICAL CARE Performed by:  Merryl Hacker   Total critical care time: 32 minutes  Critical care time was exclusive of separately billable procedures and treating other patients.  Critical care was necessary to treat or prevent imminent or life-threatening deterioration.  Critical care was time  spent personally by me on the following activities: development of treatment plan with patient and/or surrogate as well as nursing, discussions with consultants, evaluation of patient's response to treatment, examination of patient, obtaining history from patient or surrogate, ordering and performing treatments and interventions, ordering and review of laboratory studies, ordering and review of radiographic studies, pulse oximetry and re-evaluation of patient's condition.   Medications Ordered in ED Medications  sodium chloride 0.9 % bolus 1,000 mL (1,000 mLs Intravenous New Bag/Given 09/11/18 0330)  ciprofloxacin (CIPRO) IVPB 400 mg (has no administration in time range)  ondansetron (ZOFRAN) injection 4 mg (4 mg Intravenous Given 09/11/18 0222)  fentaNYL (SUBLIMAZE) injection 50 mcg (50 mcg Intravenous Given 09/11/18 0339)     Initial Impression / Assessment and Plan / ED Course  I have reviewed the triage vital signs and the nursing notes.  Pertinent labs & imaging results that were available during my care of the patient were reviewed by me and considered in my medical decision making (see chart for details).     Patient presents with an episode of syncope.  Denies significant prodrome.  It appears that she was unconscious for some period of time prior to calling her granddaughter.  She has evidence of a hematoma to the left forehead.  She is on blood thinners.  She denies any prior illnesses.  She is overall nontoxic-appearing.  Afebrile.  Vital signs initially reassuring.  She is neurologically intact on exam.  She has evidence of a hematoma to the forehead and abrasion to the left knee.  She reports nausea.  No  abdominal pain, chest pain, shortness of breath.  Work-up initiated.  Patient was unable to stand for orthostatics.  EKG shows no evidence of arrhythmia.  Her pacemaker was interrogated without any acute events.  Troponin is negative.  CT scan of the head is negative for acute intracranial abnormality.  Lab work-up is notable for creatinine of 1.98.  Last creatinine 1.1-1.3.  Patient was given fluids.  She was in and out cath for urine with very minimal urinary output.  She clinically appears dry.  She was given a normal saline bolus.  White count is 38.  This would be concerning for possible infectious etiology however, hematocrit is also 47 which would suggest some hemoconcentration.  Again she was given fluids.  Chest x-ray shows no evidence of pneumonia.  CK is negative.  No signs or symptoms of sepsis.  Urinalysis with moderate leukoesterase, 21-50 white cells and many bacteria.  Urine culture sent.  Will treat for urinary tract infection with ciprofloxacin.  Patient has anaphylaxis to penicillins.  Will admit for IV hydration and treatment for UTI.  At this time, she is clinically well-appearing and without signs or symptoms of sepsis.  Of note, she was noted to desaturate when she sleeps.  She has a documented history of sleep apnea.  Final Clinical Impressions(s) / ED Diagnoses   Final diagnoses:  Syncope, unspecified syncope type  Acute cystitis without hematuria  Dehydration  Traumatic hematoma of forehead, initial encounter  Contusion of right knee, initial encounter    ED Discharge Orders    None       Merryl Hacker, MD 09/11/18 [phone]

## 2018-09-11 NOTE — H&P (Addendum)
History and Physical  Patient Name: [name]     NLZ:[id number]    DOB: 28-Jun-1932    DOA: 09/11/2018 PCP: Fanny Bien, MD  Patient coming from: Home  Chief Complaint: Syncope      HPI: [name] is a 84 y.o. female with a past medical history significant for CAD, sCHF EF 45%, chronic atrial fibrillation on Eliquis, CKD stage III, hypothyroidism who presents with syncope.  The patient was in her usual state of health until the day of admission, she was in the bathroom micturating, and then next thing she remembers she was on the floor, had hit her head.  She couldn't get up, but crawled to the phone and called her granddaughter, who called 9-1-1.  She had no preceding nausea, weakness, warmth, dizziness, palpitations, chest pain, tachycardia.    She had had no preceding cough, fever, chills, malaise, vomiting, dysuria, urinary irritative symptoms.  ED course: - Afebrile, heart rate 101, respirations 27, blood pressure 96/79, pulse ox 94% on room air -Na 139, K 3.3, Cr 1.98 (baseline 1.1-1.3), WBC 30K, Hgb 15 - Troponin negative - CK normal -Urinalysis showed large bacteria, white blood cells -Chest x-ray clear -CT head and C-spine unremarkable, radiograph of the right knee unremarkable -Blood and urine cultures were obtained, and she was started on empiric ciprofloxacin, given IV fluids and TRH were asked to evaluate for syncope     ROS: Review of Systems  Constitutional: Negative for chills, fever and malaise/fatigue.  Respiratory: Negative for cough, sputum production and shortness of breath.   Cardiovascular: Negative for chest pain, palpitations, orthopnea, claudication, leg swelling and PND.  Gastrointestinal: Negative for abdominal pain, nausea and vomiting.  Genitourinary: Negative for dysuria, flank pain, frequency, hematuria and urgency.  Neurological: Positive for loss of consciousness. Negative for dizziness, focal weakness, seizures and weakness.    All other systems reviewed and are negative.         Past Medical History:  Diagnosis Date  . Anxiety    "some; not treated for it" (11/27/2014)  . Bradycardia   . CAD (coronary artery disease), native coronary artery    2003 normal left main, 80% mid LAD  2.5 x 13 mm Cypher stent to LAD Dr. Rex Kras, subsequent cath 2004 showed patent stent in LAD with no sig disease in RCA or Circ;    . CHF (congestive heart failure) ([place])   . CKD (chronic kidney disease), stage III ([place])   . GERD (gastroesophageal reflux disease)   . History of hiatal hernia   . Hyperlipidemia   . Hypertension   . LBBB (left bundle branch block)   . Migraine    "very rare now" (11/27/2014)  . Obesity (BMI 30-39.9) 06/18/2017  . On home oxygen therapy    "only at night; ? setting" (11/27/2014)  . Osteoarthritis cervical spine   . Sleep apnea 06/18/2017    Past Surgical History:  Procedure Laterality Date  . ABDOMINAL HYSTERECTOMY    . BIV PACEMAKER INSERTION CRT-P N/A 01/29/2018   Procedure: BIV PACEMAKER INSERTION CRT-P;  Surgeon: Evans Lance, MD;  Location: [facility];  Service: Cardiovascular;  Laterality: N/A;  . CARDIOVERSION N/A 12/05/2017   Procedure: CARDIOVERSION;  Surgeon: Jolaine Artist, MD;  Location: [facility];  Service: Cardiovascular;  Laterality: N/A;  . CATARACT EXTRACTION W/ INTRAOCULAR LENS  IMPLANT, BILATERAL Bilateral   . CYSTOCELE REPAIR      Social History: Patient lives by herself.  The patient walks  unassisted.  Nonsmoker.  Allergies  Allergen Reactions  . Ampicillin Shortness Of Breath, Swelling and Rash  . Hydromorphone Hcl Shortness Of Breath, Itching, Swelling and Rash  . Penicillins Anaphylaxis and Other (See Comments)    Has patient had a PCN reaction causing immediate rash, facial/tongue/throat swelling, SOB or lightheadedness with hypotension: Yes Has patient had a PCN reaction causing severe rash involving mucus membranes or skin necrosis: Unknown Has  patient had a PCN reaction that required hospitalization: Yes (was in the hosp) Has patient had a PCN reaction occurring within the last 10 years: No If all of the above answers are "NO", then may proceed with Cephalosporin use.     Family history: family history includes Heart Problems in her mother; Heart attack in her father and mother; Hypertension in her father and mother.  Prior to Admission medications   Medication Sig Start Date End Date Taking? Authorizing Provider  acetaminophen (TYLENOL) 325 MG tablet Take 2 tablets (650 mg total) by mouth every 6 (six) hours as needed for mild pain (or Fever >/= 101). 12/02/14  Yes Kelvin Cellar, MD  amiodarone (PACERONE) 200 MG tablet Take 1 tablet (200 mg total) by mouth daily. 06/22/17  Yes Florencia Reasons, MD  apixaban (ELIQUIS) 2.5 MG TABS tablet Take 1 tablet (2.5 mg total) by mouth 2 (two) times daily. 12/08/17  Yes Helberg, Larkin Ina, MD  atorvastatin (LIPITOR) 80 MG tablet Take 1 tablet (80 mg total) by mouth daily. 06/22/17  Yes Florencia Reasons, MD  butalbital-acetaminophen-caffeine (FIORICET, ESGIC) [phone] MG tablet Take 1 tablet by mouth every 4 (four) hours as needed for headache or migraine.    Yes [provider]  furosemide (LASIX) 40 MG tablet TAKE 2 TABLETS BY MOUTH DAILY 05/16/18  Yes Bensimhon, Shaune Pascal, MD  levothyroxine (SYNTHROID, LEVOTHROID) 200 MCG tablet Take 1 tablet (200 mcg total) by mouth daily before breakfast. 12/09/17  Yes Helberg, Larkin Ina, MD  metoprolol succinate (TOPROL-XL) 50 MG 24 hr tablet Take 1 tablet (50 mg total) by mouth daily. Take with or immediately following a meal. 12/09/17  Yes Helberg, Larkin Ina, MD  omeprazole (PRILOSEC OTC) 20 MG tablet Take 20 mg by mouth daily as needed (for reflux).   Yes [provider]  oxymetazoline (AFRIN) 0.05 % nasal spray Place 1 spray into both nostrils as needed for congestion.   Yes [provider]  polyethylene glycol (MIRALAX / GLYCOLAX) packet Take 17 g by mouth  daily. Patient taking differently: Take 17 g by mouth daily as needed for moderate constipation.  06/22/17  Yes Florencia Reasons, MD  senna-docusate (SENOKOT-S) 8.6-50 MG tablet Take 1 tablet by mouth at bedtime. Patient taking differently: Take 1 tablet by mouth at bedtime as needed for mild constipation.  06/21/17  Yes Florencia Reasons, MD  spironolactone (ALDACTONE) 25 MG tablet Take 1 tablet (25 mg total) by mouth daily. 02/06/18  Yes Clegg, Amy D, NP  White Petrolatum-Mineral Oil (GENTEAL TEARS NIGHT-TIME) OINT Apply 1 application to eye as needed (eye discomfort).   Yes [provider]       Physical Exam: BP (!) 105/58   Pulse 67   Temp 98.3 F (36.8 C) (Oral)   Resp 18   Ht 5\' 4"  (1.626 m)   Wt 77.6 kg   SpO2 98%   BMI 29.37 kg/m  General appearance: Elderly obese adult female, alert and in no acute distress.   Head/Eyes: Bruise on left forehead.  Anicteric, conjunctiva pink, lids and lashes normal. PERRL.  ENT: No nasal deformity, discharge, epistaxis.  Hearing very diminished. OP dry without lesions.   Neck: No neck masses.  Trachea midline.  No thyromegaly/tenderness. Lymph: No cervical or supraclavicular lymphadenopathy. Skin: Warm and dry.  No jaundice.  No suspicious rashes or lesions. Bruise on left forehead, right knee Cardiac: RRR, nl S1-S2, no murmurs appreciated.  Capillary refill is brisk.  JVP not visible.  No LE edema.  Radial pulses 2+ and symmetric. Respiratory: Normal respiratory rate and rhythm.  CTAB without rales or wheezes. Abdomen: Abdomen soft.  No TTP. No ascites, distension, hepatosplenomegaly.   MSK: No deformities or effusions.  No cyanosis or clubbing. Neuro: Cranial nerves 3-12 intact.  Sensation intact to light touch. Speech is fluent.  Muscle strength 5/5 in both upper and lower extremities bilaterally.    Psych: Sensorium intact and responding to questions, attention normal.  Behavior appropriate.  Affect nromal.  Judgment and insight appear  normal.     Labs on Admission:  I have personally reviewed following labs and imaging studies: CBC: Recent Labs  Lab 09/11/18 0120  WBC 38.0*  HGB 15.0  HCT 47.4*  MCV 95.2  PLT 856   Basic Metabolic Panel: Recent Labs  Lab 09/11/18 0120  NA 139  K 3.3*  CL 95*  CO2 24  GLUCOSE 213*  BUN 29*  CREATININE 1.98*  CALCIUM 10.1   GFR: Estimated Creatinine Clearance: 21.3 mL/min (A) (by C-G formula based on SCr of 1.98 mg/dL (H)).  Liver Function Tests: No results for input(s): AST, ALT, ALKPHOS, BILITOT, PROT, ALBUMIN in the last 168 hours. No results for input(s): LIPASE, AMYLASE in the last 168 hours. No results for input(s): AMMONIA in the last 168 hours. Coagulation Profile: No results for input(s): INR, PROTIME in the last 168 hours. Cardiac Enzymes: Recent Labs  Lab 09/11/18 0120 09/11/18 0914  CKTOTAL 58  --   TROPONINI  --  0.03*   BNP (last 3 results) No results for input(s): PROBNP in the last 8760 hours. HbA1C: No results for input(s): HGBA1C in the last 72 hours. CBG: Recent Labs  Lab 09/11/18 0118  GLUCAP 216*   Lipid Profile: No results for input(s): CHOL, HDL, LDLCALC, TRIG, CHOLHDL, LDLDIRECT in the last 72 hours. Thyroid Function Tests: No results for input(s): TSH, T4TOTAL, FREET4, T3FREE, THYROIDAB in the last 72 hours. Anemia Panel: No results for input(s): VITAMINB12, FOLATE, FERRITIN, TIBC, IRON, RETICCTPCT in the last 72 hours. Sepsis Labs: Lactate 3.1 Invalid input(s): PROCALCITONIN, LACTICACIDVEN No results found for this or any previous visit (from the past 240 hour(s)).       Radiological Exams on Admission: Personally reviewed CXR showed no focal airspace disease or opacity, CT head report reviewed and summarized above: Dg Chest 2 View  Result Date: 09/11/2018 CLINICAL DATA:  Increased WBC EXAM: CHEST - 2 VIEW COMPARISON:  01/30/2018 FINDINGS: Left-sided multi lead pacing device as before. Chronic elevation of right  diaphragm with subsegmental atelectasis. No focal airspace disease or pleural effusion. Stable cardiomediastinal silhouette with aortic atherosclerosis. No pneumothorax. IMPRESSION: No active cardiopulmonary disease. Chronic elevation of the right diaphragm with subsegmental atelectasis. Electronically Signed   By: Donavan Foil M.D.   On: 09/11/2018 02:35   Ct Head Wo Contrast  Result Date: 09/11/2018 CLINICAL DATA:  Syncopal episode hematoma to the head EXAM: CT HEAD WITHOUT CONTRAST CT CERVICAL SPINE WITHOUT CONTRAST TECHNIQUE: Multidetector CT imaging of the head and cervical spine was performed following the standard protocol without intravenous contrast. Multiplanar CT image  reconstructions of the cervical spine were also generated. COMPARISON:  CT brain 11/24/2017, MRI 01/05/2010 FINDINGS: CT HEAD FINDINGS Brain: No acute territorial infarction, hemorrhage or intracranial mass. Moderate atrophy. Mild small vessel ischemic changes of the white matter. Chronic lacunar infarct in the left basal ganglia. Stable ventricle size. Vascular: No hyperdense vessels. Vertebral artery and carotid vascular calcification Skull: Normal. Negative for fracture or focal lesion. Sinuses/Orbits: No acute finding. Other: Moderate left frontal scalp hematoma CT CERVICAL SPINE FINDINGS Alignment: No subluxation.  Facet alignment is within normal limits. Skull base and vertebrae: No acute fracture. No primary bone lesion or focal pathologic process. Soft tissues and spinal canal: No prevertebral fluid or swelling. No visible canal hematoma. Disc levels: Moderate degenerative changes C3-C4, C5-C6. Multiple level bilateral left greater than right hypertrophic facet degenerative change. Upper chest: Negative. Other: None IMPRESSION: 1. No CT evidence for acute intracranial abnormality. Atrophy and small vessel ischemic changes of the white matter 2. Degenerative changes of the cervical spine. No acute osseous abnormality  Electronically Signed   By: Donavan Foil M.D.   On: 09/11/2018 02:34   Ct Cervical Spine Wo Contrast  Result Date: 09/11/2018 CLINICAL DATA:  Syncopal episode hematoma to the head EXAM: CT HEAD WITHOUT CONTRAST CT CERVICAL SPINE WITHOUT CONTRAST TECHNIQUE: Multidetector CT imaging of the head and cervical spine was performed following the standard protocol without intravenous contrast. Multiplanar CT image reconstructions of the cervical spine were also generated. COMPARISON:  CT brain 11/24/2017, MRI 01/05/2010 FINDINGS: CT HEAD FINDINGS Brain: No acute territorial infarction, hemorrhage or intracranial mass. Moderate atrophy. Mild small vessel ischemic changes of the white matter. Chronic lacunar infarct in the left basal ganglia. Stable ventricle size. Vascular: No hyperdense vessels. Vertebral artery and carotid vascular calcification Skull: Normal. Negative for fracture or focal lesion. Sinuses/Orbits: No acute finding. Other: Moderate left frontal scalp hematoma CT CERVICAL SPINE FINDINGS Alignment: No subluxation.  Facet alignment is within normal limits. Skull base and vertebrae: No acute fracture. No primary bone lesion or focal pathologic process. Soft tissues and spinal canal: No prevertebral fluid or swelling. No visible canal hematoma. Disc levels: Moderate degenerative changes C3-C4, C5-C6. Multiple level bilateral left greater than right hypertrophic facet degenerative change. Upper chest: Negative. Other: None IMPRESSION: 1. No CT evidence for acute intracranial abnormality. Atrophy and small vessel ischemic changes of the white matter 2. Degenerative changes of the cervical spine. No acute osseous abnormality Electronically Signed   By: Donavan Foil M.D.   On: 09/11/2018 02:34   Dg Knee Complete 4 Views Right  Result Date: 09/11/2018 CLINICAL DATA:  Syncopal episode. Hematoma to the right knee. EXAM: RIGHT KNEE - COMPLETE 4+ VIEW COMPARISON:  None. FINDINGS: Right knee appears intact. No  evidence of acute fracture or subluxation. No focal bone lesion or bone destruction. Bone cortex and trabecular architecture appear intact. No radiopaque soft tissue foreign bodies. Infiltration and swelling of the subcutaneous fat anterior to the infrapatellar region suggesting hematoma. IMPRESSION: Soft tissue hematoma. No acute bony abnormalities. Electronically Signed   By: Lucienne Capers M.D.   On: 09/11/2018 02:21    EKG: Independently reviewed. Paced rhythm, normal rate.    Assessment/Plan  Sepsis, unclear source:  Suspected source urine given UA although she has no symptoms to direct work up and her lactic acidemia is out of proportion to how benign she feels.  Patient meets criteria given tachycardia, tachypnea, leukocytosis, and evidence of organ dysfunction.  Lactate 3.1 mmol/L and repeat ordered within 6 hours.  This patient is at high risk of poor outcomes with a qSOFA score of 2.  Antibiotics delivered in the ED.     -Blood and urine cultures drawn -Antibiotics: Merrem -Follow blood and urine culture   Syncope Routine echo f/u two weeks ago by CHF team as outpatient, mild LVH, grade I DD, normal EF, no sig valve disease.  No severe electrolyte abnormalities.  Furthermore, patient presents with leukocytosis, elevated lactic acid and AKI.  Suspect sepsis and hypovolemia leading to vagal or orthostatic syncope, rather than cardiogenic syncope.    Orthostatics could not be done in ER.  Pacer interrogated and no arrhythmias. -Cycle troponins -IV fluids -Monitor for arrhythmia on tele -Repeat orthostatics tomorrow  ADDENDUM: Minimal flat troponin elevation, no chest pain, not consistent with ACS.  Will repeat Echo to rule out WMA, but if none, feel ACS is ruled out.  Hypokalemia -Check mag -Goal K>4, Mag>2  Chronic systolic CHF:  Coronary artery disease -Hold furosemide, Spironolactone -Continue atorvastatin -K supplement -Strict I/Os, daily weights, telemetry   -Daily monitoring renal function -Continue metoprolol  Chronic atrial fibrillation:  CHA2DS2-Vasc 5, on Eliquis. -Continue apixaban -Continue amiodarone -Continue metoprolol  Acute kidney injury on CKD stage III:  On furosemide, precludes FeNA.  By history, suspect pre-renal injury. -IV fluids, trend Cr  Hypothyroidism:  -Continue levothyroxine  Other medciations -Continue PPI       DVT prophylaxis: N/A on apixaban  Code Status: FULL  Family Communication: None present  Disposition Plan: Anticipate IV antibiotics, follow culture data. Consults called: None Admission status: INPATIENT   At the time of admission, it appears that the appropriate admission status for this patient is INPATIENT. This is judged to be reasonable and necessary in order to provide the required intensity of service to ensure the patient's safety given the presenting symptoms of syncope, physical exam findings of tachycardia >100, RR>25, with leukocytosis, elevated lactic acid, and acute kidney injury, and in the context of her chronic comorbid atrial fibrillation, CKD, chronic systolic CHF.  Together, these circumstances are felt to place her at high risk for further clinical deterioration threatening life, limb, or organ.   Patient requires inpatient status due to high intensity of service, high risk for further deterioration and high frequency of surveillance required because of this acute illness that poses a threat to life, limb or bodily function.  I certify that at the point of admission it is my clinical judgment that the patient will require inpatient hospital care spanning beyond 2 midnights from the point of admission and that early discharge would result in unnecessary risk of decompensation and readmission or threat to life, limb or bodily function.       Medical decision making: Patient seen at 7:30 AM on 09/11/2018.  What exists of the patient's chart was reviewed in depth and  summarized above.  Clinical condition: stabilized with IV fluids, mentating well.        Edwin Dada [facility] Pager [phone]

## 2018-09-12 ENCOUNTER — Inpatient Hospital Stay (HOSPITAL_COMMUNITY): Payer: Medicare Other

## 2018-09-12 DIAGNOSIS — R55 Syncope and collapse: Secondary | ICD-10-CM

## 2018-09-12 DIAGNOSIS — I5030 Unspecified diastolic (congestive) heart failure: Secondary | ICD-10-CM

## 2018-09-12 DIAGNOSIS — N3000 Acute cystitis without hematuria: Secondary | ICD-10-CM

## 2018-09-12 DIAGNOSIS — Z950 Presence of cardiac pacemaker: Secondary | ICD-10-CM

## 2018-09-12 LAB — ECHOCARDIOGRAM COMPLETE
Height: 64 in
Weight: 3105.6 oz

## 2018-09-12 LAB — CBC
HCT: 32.5 % — ABNORMAL LOW (ref 36.0–46.0)
HEMOGLOBIN: 10.7 g/dL — AB (ref 12.0–15.0)
MCH: 31.2 pg (ref 26.0–34.0)
MCHC: 32.9 g/dL (ref 30.0–36.0)
MCV: 94.8 fL (ref 80.0–100.0)
Platelets: 192 10*3/uL (ref 150–400)
RBC: 3.43 MIL/uL — AB (ref 3.87–5.11)
RDW: 13.6 % (ref 11.5–15.5)
WBC: 21.2 10*3/uL — AB (ref 4.0–10.5)
nRBC: 0 % (ref 0.0–0.2)

## 2018-09-12 LAB — BASIC METABOLIC PANEL
ANION GAP: 9 (ref 5–15)
BUN: 31 mg/dL — ABNORMAL HIGH (ref 8–23)
CO2: 22 mmol/L (ref 22–32)
Calcium: 8.6 mg/dL — ABNORMAL LOW (ref 8.9–10.3)
Chloride: 101 mmol/L (ref 98–111)
Creatinine, Ser: 1.78 mg/dL — ABNORMAL HIGH (ref 0.44–1.00)
GFR calc Af Amer: 29 mL/min — ABNORMAL LOW (ref >60)
GFR calc non Af Amer: 25 mL/min — ABNORMAL LOW (ref >60)
Glucose, Bld: 115 mg/dL — ABNORMAL HIGH (ref 70–99)
POTASSIUM: 4 mmol/L (ref 3.5–5.1)
SODIUM: 132 mmol/L — AB (ref 135–145)

## 2018-09-12 MED ORDER — MORPHINE SULFATE (PF) 2 MG/ML IV SOLN
2.0000 mg | Freq: Once | INTRAVENOUS | Status: AC
  Administered 2018-09-12: 2 mg via INTRAVENOUS
  Filled 2018-09-12: qty 1

## 2018-09-12 MED ORDER — SENNOSIDES-DOCUSATE SODIUM 8.6-50 MG PO TABS
1.0000 | ORAL_TABLET | Freq: Two times a day (BID) | ORAL | Status: DC
  Administered 2018-09-12 – 2018-09-18 (×10): 1 via ORAL
  Filled 2018-09-12 (×12): qty 1

## 2018-09-12 MED ORDER — BUTALBITAL-APAP-CAFFEINE 50-325-40 MG PO TABS: 2.0000 | ORAL_TABLET | Freq: Once | ORAL | Status: DC

## 2018-09-12 MED ORDER — FUROSEMIDE 10 MG/ML IJ SOLN
80.0000 mg | Freq: Once | INTRAMUSCULAR | Status: AC
  Administered 2018-09-12: 80 mg via INTRAVENOUS
  Filled 2018-09-12: qty 8

## 2018-09-12 MED ORDER — SODIUM CHLORIDE 0.9 % IV SOLN
INTRAVENOUS | Status: DC | PRN
  Administered 2018-09-12: 1000 mL via INTRAVENOUS

## 2018-09-12 MED ORDER — METOPROLOL SUCCINATE ER 25 MG PO TB24
12.5000 mg | ORAL_TABLET | Freq: Every day | ORAL | Status: DC
  Administered 2018-09-13 – 2018-09-18 (×6): 12.5 mg via ORAL
  Filled 2018-09-12 (×6): qty 1

## 2018-09-12 NOTE — Progress Notes (Signed)
Call placed to [facility] to notify of telemetry monitoring d/c per MD order.

## 2018-09-12 NOTE — Progress Notes (Signed)
   09/12/18 0727  Urine Characteristics  Urine Color Yellow/straw  Urine Appearance Mucous;Bloody  Urine Odor No odor   Pt reports pain-less urination with small amount of bloody-mucous presenting in the urine catch post-void.

## 2018-09-12 NOTE — Progress Notes (Signed)
Patient off the floor, for echo.

## 2018-09-12 NOTE — Progress Notes (Signed)
Verified need of fluids with overnight provider due to patient weight gain. Provider stated to continue fluids. Will verify weight and continue to monitor.

## 2018-09-12 NOTE — Progress Notes (Signed)
   09/12/18 0608  Height and Weight  Weight 88 kg  Type of Scale Used Standing  Type of Weight Actual (scale b)  BMI (Calculated) 33.3   Patients weight checked multiple times this am. discrepancy noted by staff, but confirmed.

## 2018-09-12 NOTE — Progress Notes (Signed)
Echocardiogram 2D Echocardiogram has been performed.  [name] 09/12/2018, 10:10 AM

## 2018-09-12 NOTE — Progress Notes (Signed)
PROGRESS NOTE  [name]:[id number] DOB: [birth year] DOA: 09/11/2018 PCP: Fanny Bien, MD  HPI/Recap of past 24 hours:  Reports being constipated, reports soft stool seeping out with blood in it She denies ab pain, no dysuria She reports 20pounds weight gain since being hospitalized, she reports being swollen, she wants her lasix No fever  Assessment/Plan: Principal Problem:   Sepsis ([place]) Active Problems:   AKI (acute kidney injury) ([place])   Persistent atrial fibrillation   CKD (chronic kidney disease), stage III ([place])   Sleep apnea   LBBB (left bundle branch block)   Chronic systolic heart failure ([place])   Hypothyroidism   Syncope   Hypokalemia   Syncope with scalp hematoma: -per ED intake" syncopal episode that occurred when she stood from toilet; pt states she was unconscious for unnkown period of time and family found her and got her out of the floor; EMS reports pt was very pale on scene with large hematoma to L head and R knee abrasion/bruise; pt is on ELIQUIS; " -Ct head/CT cervical spine no acute findings,  EKG paced rhythm -echo cardiogram pending -Troponin 0.03. 0.03, she denies chest pain -she is found to have uti/aki which are likely causes of syncope  UTI with sepsis presented on admission -wbc 38, AKI, elevated lactic acid of 3.1 -urine culture Gram negative rods -continue meropenem, follow up on final culture/sensitivity result  AKI on CKDIII -cr 1.13 in 01/2018 -cr 1.98 on presentation -diuretic held on presentation, due to reports of significant weight gain on 10/16, she is given iv lasix Repeat bmp in am, Renal dosing meds  Acute on chronic diastolic chf Weight gain of 20pounds Will give iv lasix today, reeval in am  Chronic atrial fibrillation with h/o bradycardia s/p pacemaker placement:  CHA2DS2-Vasc 5, on Eliquis. -Continue apixaban -Continue amiodarone -Continue metoprolol (bp borderline low, decrease bb  dose)  Hypothyroidism:  -Continue levothyroxine  FTT SNF  Code Status:  full  Family Communication: patient   Disposition Plan: snf when medically ready   Consultants:  none  Procedures:  none  Antibiotics:  meropenem   Objective: BP 103/87   Pulse 65   Temp (!) 97.4 F (36.3 C) (Oral)   Resp 18   Ht 5\' 4"  (1.626 m)   Wt 88 kg   SpO2 100%   BMI 33.32 kg/m   Intake/Output Summary (Last 24 hours) at 09/12/2018 1217 Last data filed at 09/12/2018 9323 Gross per 24 hour  Intake 4039.45 ml  Output 1650 ml  Net 2389.45 ml   Filed Weights   09/11/18 0522 09/12/18 0123 09/12/18 5573  Weight: 77.6 kg 87.8 kg 88 kg    Exam: Patient is examined daily including today on 09/12/2018, exams remain the same as of yesterday except that has changed    General:  Frail, hard of hearing, NAD  Cardiovascular: paced rhythm  Respiratory: diminished at basis  Abdomen: Soft/ND/NT, positive BS  Musculoskeletal: trace bilateral lower extremity pitting Edema  Neuro: alert, oriented   Data Reviewed: Basic Metabolic Panel: Recent Labs  Lab 09/11/18 0120 09/11/18 1447 09/12/18 0341  NA 139  --  132*  K 3.3*  --  4.0  CL 95*  --  101  CO2 24  --  22  GLUCOSE 213*  --  115*  BUN 29*  --  31*  CREATININE 1.98*  --  1.78*  CALCIUM 10.1  --  8.6*  MG  --  1.7  --  Liver Function Tests: No results for input(s): AST, ALT, ALKPHOS, BILITOT, PROT, ALBUMIN in the last 168 hours. No results for input(s): LIPASE, AMYLASE in the last 168 hours. No results for input(s): AMMONIA in the last 168 hours. CBC: Recent Labs  Lab 09/11/18 0120 09/12/18 0341  WBC 38.0* 21.2*  HGB 15.0 10.7*  HCT 47.4* 32.5*  MCV 95.2 94.8  PLT 322 192   Cardiac Enzymes:   Recent Labs  Lab 09/11/18 0120 09/11/18 0914 09/11/18 1447  CKTOTAL 58  --   --   TROPONINI  --  0.03* 0.03*   BNP (last 3 results) Recent Labs    11/24/17 1155  BNP 550.6*    ProBNP (last 3  results) No results for input(s): PROBNP in the last 8760 hours.  CBG: Recent Labs  Lab 09/11/18 0118  GLUCAP 216*    Recent Results (from the past 240 hour(s))  Urine culture     Status: Abnormal (Preliminary result)   Collection Time: 09/11/18  2:58 AM  Result Value Ref Range Status   Specimen Description URINE, RANDOM  Final   Special Requests   Final    NONE Performed at [facility], [place] [address], [place], [place] [address]    Culture >=100,000 COLONIES/mL ESCHERICHIA COLI (A)  Final   Report Status PENDING  Incomplete  Culture, blood (routine x 2)     Status: None (Preliminary result)   Collection Time: 09/11/18  5:30 AM  Result Value Ref Range Status   Specimen Description BLOOD RIGHT ANTECUBITAL  Final   Special Requests   Final    BOTTLES DRAWN AEROBIC ONLY Blood Culture adequate volume   Culture   Final    NO GROWTH 1 DAY Performed at [facility], [place] [address] [place], [place] [address]    Report Status PENDING  Incomplete  Culture, blood (routine x 2)     Status: None (Preliminary result)   Collection Time: 09/11/18  5:30 AM  Result Value Ref Range Status   Specimen Description BLOOD RIGHT ANTECUBITAL  Final   Special Requests   Final    BOTTLES DRAWN AEROBIC ONLY Blood Culture adequate volume   Culture   Final    NO GROWTH 1 DAY Performed at [facility], [place] [address] [place], [place] [address]    Report Status PENDING  Incomplete     Studies: No results found.  Scheduled Meds: . amiodarone  200 mg Oral Daily  . apixaban  2.5 mg Oral BID  . atorvastatin  80 mg Oral Daily  . furosemide  80 mg Intravenous Once  . Influenza vac split quadrivalent PF  0.5 mL Intramuscular Tomorrow-1000  . levothyroxine  200 mcg Oral QAC breakfast  . metoprolol succinate  50 mg Oral Daily  . senna-docusate  1 tablet Oral BID    Continuous Infusions: . sodium chloride 1,000 mL (09/12/18 1113)  . meropenem (MERREM) IV 1 g (09/12/18 1115)      Time spent: 38mins I have personally reviewed and interpreted on  09/12/2018 daily labs, tele strips, imagings as discussed above under date review session and assessment and plans.  I reviewed all nursing notes, pharmacy notes,   vitals, pertinent old records  I have discussed plan of care as described above with RN , patient on 09/12/2018   Florencia Reasons MD, [name]  [facility] Pager [phone]. If 7PM-7AM, please contact night-coverage at [email or web address], password [facility] 09/12/2018, 12:17 PM  LOS: 1 day

## 2018-09-12 NOTE — Evaluation (Signed)
Occupational Therapy Evaluation Patient Details Name: [name] MRN: [id number] DOB: [birth year] Today's Date: 09/12/2018    History of Present Illness Pt is an 84 y/o female admitted following syncopal episode. Pt with likely sepsis secondary to UTI. CT of head and c-spine negative for acute abnormality. R knee imaging revealed soft tissue hematoma. PMH includes a fib, CKD, CAD, CHF, HTN, and s/p pacemaker.    Clinical Impression   Pt was admitted for the above. At baseline, she completes basic ADLs & some meals and lives alone.  Family lives nearby, checks on her and performs IADLs.  Pt was orthostatic in standing and is generally weaker than baseline. Will follow in acute setting with supervision to min guard level goals.  Pt wants to go home:  Recommend 24/7; assist for adls and mobility if she refuses SNF   She would benefit from continued OT after d/c from acute setting    Follow Up Recommendations  SNF;Supervision/Assistance - 24 hour(if home, HHOT)    Equipment Recommendations  None recommended by OT    Recommendations for Other Services       Precautions / Restrictions Precautions Precautions: Fall;Other (comment) Precaution Comments: watch BP gets orthostatic Restrictions Weight Bearing Restrictions: No      Mobility Bed Mobility         Supine to sit: Supervision        Transfers   Equipment used: Rolling walker (2 wheeled)   Sit to Stand: Min assist Stand pivot transfers: Min guard       General transfer comment: Assist to stand from high bed; steadying assistance for spt to chair    Balance     Sitting balance-Leahy Scale: Fair       Standing balance-Leahy Scale: Poor                             ADL either performed or assessed with clinical judgement   ADL Overall ADL's : Needs assistance/impaired Eating/Feeding: Independent   Grooming: Set up;Sitting   Upper Body Bathing: Set up;Sitting   Lower Body Bathing: Minimal  assistance;Sit to/from stand   Upper Body Dressing : Set up;Sitting   Lower Body Dressing: Maximal assistance;Sit to/from stand   Toilet Transfer: Minimal assistance;Stand-pivot;RW(chair)   Toileting- Clothing Manipulation and Hygiene: Minimal assistance;Sit to/from stand         General ADL Comments: took orthostatics:  see vitals. Pt was orthostatic in standing and felt a little dizzy. Able to get to chair with steadying assistance.     Vision         Perception     Praxis      Pertinent Vitals/Pain Pain Assessment: Faces Faces Pain Scale: Hurts little more Pain Location: headache Pain Descriptors / Indicators: Guarding;Grimacing Pain Intervention(s): Limited activity within patient's tolerance;Monitored during session;Repositioned;Patient requesting pain meds-RN notified     Hand Dominance     Extremity/Trunk Assessment Upper Extremity Assessment Upper Extremity Assessment: Generalized weakness           Communication Communication Communication: HOH   Cognition Arousal/Alertness: Awake/alert Behavior During Therapy: WFL for tasks assessed/performed Overall Cognitive Status: Within Functional Limits for tasks assessed                                     General Comments       Exercises     Shoulder Instructions  Home Living Family/patient expects to be discharged to:: Private residence Living Arrangements: Alone Available Help at Discharge: Family;Available PRN/intermittently Type of Home: House             Bathroom Shower/Tub: Walk-in shower;Curtain   Bathroom Toilet: Standard     Home Equipment: Clinical cytogeneticist - 4 wheels;Bedside commode   Additional Comments: uses 3;1 over toilet      Prior Functioning/Environment      ADL's / Homemaking Assistance Needed: Reports she can perform ADLs, however, pt's daughter in law cooks and cleans.    Comments: often just stays in robe/gown. Can get underwear and socks on  herself        OT Problem List: Decreased strength;Decreased activity tolerance;Impaired balance (sitting and/or standing);Decreased knowledge of use of DME or AE;Pain;Cardiopulmonary status limiting activity      OT Treatment/Interventions: Self-care/ADL training;DME and/or AE instruction;Energy conservation;Balance training;Patient/family education;Therapeutic activities    OT Goals(Current goals can be found in the care plan section) Acute Rehab OT Goals Patient Stated Goal: to go home  OT Goal Formulation: With patient Time For Goal Achievement: 09/26/18 Potential to Achieve Goals: Good ADL Goals Pt Will Perform Grooming: with supervision;standing Pt Will Perform Lower Body Bathing: with supervision;sit to/from stand Pt Will Perform Lower Body Dressing: with supervision;sit to/from stand;with adaptive equipment(prn) Pt Will Transfer to Toilet: with supervision;ambulating;bedside commode Pt Will Perform Tub/Shower Transfer: ambulating;with min guard assist;Shower transfer;shower seat Additional ADL Goal #1: pt will initiate rest break without cues for energy conservation and verbalize 3 strategies  OT Frequency: Min 2X/week   Barriers to D/C:            Co-evaluation              AM-PAC PT "6 Clicks" Daily Activity     Outcome Measure Help from another person eating meals?: None Help from another person taking care of personal grooming?: A Little Help from another person toileting, which includes using toliet, bedpan, or urinal?: A Little Help from another person bathing (including washing, rinsing, drying)?: A Little Help from another person to put on and taking off regular upper body clothing?: A Little Help from another person to put on and taking off regular lower body clothing?: A Lot 6 Click Score: 18   End of Session    Activity Tolerance: (orthostatic) Patient left: in chair;with call bell/phone within reach;with chair alarm set  OT Visit Diagnosis:  Unsteadiness on feet (R26.81);Muscle weakness (generalized) (M62.81)                Time: 2800-3491 OT Time Calculation (min): 31 min Charges:  OT General Charges $OT Visit: 1 Visit OT Evaluation $OT Eval Moderate Complexity: 1 Mod OT Treatments $Therapeutic Activity: 8-22 mins  Lesle Chris, OTR/L Acute Rehabilitation Services [phone] [facility] pager [phone] office 09/12/2018  Britni Driscoll 09/12/2018, 11:43 AM

## 2018-09-12 NOTE — Progress Notes (Signed)
Patient sleeping during shift report.      

## 2018-09-13 ENCOUNTER — Inpatient Hospital Stay (HOSPITAL_COMMUNITY): Payer: Medicare Other

## 2018-09-13 LAB — CBC WITH DIFFERENTIAL/PLATELET
Abs Immature Granulocytes: 0.04 10*3/uL (ref 0.00–0.07)
BASOS ABS: 0.1 10*3/uL (ref 0.0–0.1)
BASOS PCT: 1 %
EOS ABS: 0.4 10*3/uL (ref 0.0–0.5)
EOS PCT: 3 %
HCT: 31.2 % — ABNORMAL LOW (ref 36.0–46.0)
Hemoglobin: 10.1 g/dL — ABNORMAL LOW (ref 12.0–15.0)
Immature Granulocytes: 0 %
LYMPHS PCT: 20 %
Lymphs Abs: 2.5 10*3/uL (ref 0.7–4.0)
MCH: 30.9 pg (ref 26.0–34.0)
MCHC: 32.4 g/dL (ref 30.0–36.0)
MCV: 95.4 fL (ref 80.0–100.0)
Monocytes Absolute: 1.3 10*3/uL — ABNORMAL HIGH (ref 0.1–1.0)
Monocytes Relative: 10 %
Neutro Abs: 8.3 10*3/uL — ABNORMAL HIGH (ref 1.7–7.7)
Neutrophils Relative %: 66 %
Platelets: 205 10*3/uL (ref 150–400)
RBC: 3.27 MIL/uL — AB (ref 3.87–5.11)
RDW: 13.6 % (ref 11.5–15.5)
WBC: 12.5 10*3/uL — AB (ref 4.0–10.5)
nRBC: 0 % (ref 0.0–0.2)

## 2018-09-13 LAB — BASIC METABOLIC PANEL
Anion gap: 8 (ref 5–15)
BUN: 24 mg/dL — AB (ref 8–23)
CALCIUM: 8.4 mg/dL — AB (ref 8.9–10.3)
CO2: 27 mmol/L (ref 22–32)
CREATININE: 1.78 mg/dL — AB (ref 0.44–1.00)
Chloride: 98 mmol/L (ref 98–111)
GFR calc Af Amer: 29 mL/min — ABNORMAL LOW (ref >60)
GFR calc non Af Amer: 25 mL/min — ABNORMAL LOW (ref >60)
Glucose, Bld: 109 mg/dL — ABNORMAL HIGH (ref 70–99)
Potassium: 3.9 mmol/L (ref 3.5–5.1)
SODIUM: 133 mmol/L — AB (ref 135–145)

## 2018-09-13 LAB — URINE CULTURE

## 2018-09-13 MED ORDER — BUTALBITAL-APAP-CAFFEINE 50-325-40 MG PO TABS
1.0000 | ORAL_TABLET | ORAL | Status: DC | PRN
  Administered 2018-09-13 – 2018-09-18 (×8): 1 via ORAL
  Filled 2018-09-13 (×8): qty 1

## 2018-09-13 MED ORDER — SULFAMETHOXAZOLE-TRIMETHOPRIM 400-80 MG PO TABS
1.0000 | ORAL_TABLET | Freq: Two times a day (BID) | ORAL | Status: AC
  Administered 2018-09-13 – 2018-09-17 (×9): 1 via ORAL
  Filled 2018-09-13 (×9): qty 1

## 2018-09-13 MED ORDER — FUROSEMIDE 10 MG/ML IJ SOLN
80.0000 mg | Freq: Once | INTRAMUSCULAR | Status: AC
  Administered 2018-09-13: 80 mg via INTRAVENOUS
  Filled 2018-09-13: qty 8

## 2018-09-13 NOTE — Clinical Social Work Placement (Signed)
   CLINICAL SOCIAL WORK PLACEMENT  NOTE  Date:  09/13/2018  Patient Details  Name: [name] MRN: [id number] Date of Birth: 1932/04/30  Clinical Social Work is seeking post-discharge placement for this patient at the [place] level of care (*CSW will initial, date and re-position this form in  chart as items are completed):      Patient/family provided with [facility] Work Department's list of facilities offering this level of care within the geographic area requested by the patient (or if unable, by the patient's family).      Patient/family informed of their freedom to choose among providers that offer the needed level of care, that participate in Medicare, Medicaid or managed care program needed by the patient, have an available bed and are willing to accept the patient.      Patient/family informed of [facility]'s ownership interest in [facility] and [facility], as well as of the fact that they are under no obligation to receive care at these facilities.  PASRR submitted to EDS on 09/13/18     PASRR number received on       Existing PASRR number confirmed on       FL2 transmitted to all facilities in geographic area requested by pt/family on 09/13/18     FL2 transmitted to all facilities within larger geographic area on       Patient informed that his/her managed care company has contracts with or will negotiate with certain facilities, including the following:            Patient/family informed of bed offers received.  Patient chooses bed at       Physician recommends and patient chooses bed at      Patient to be transferred to   on  .  Patient to be transferred to facility by       Patient family notified on   of transfer.  Name of family member notified:        PHYSICIAN Please sign FL2     Additional Comment:    _______________________________________________ Candie Chroman, LCSW 09/13/2018, 11:55 AM

## 2018-09-13 NOTE — [place] FL2 (Signed)
[place] MEDICAID FL2 LEVEL OF CARE SCREENING TOOL     IDENTIFICATION  Patient Name: [name] Birthdate: Jul 16, 1932 Sex: female Admission Date (Current Location): 09/11/2018  [facility] and [place] Number:  Herbalist and Address:  [facility], [place] [address], [place], [place] [address]      Provider Number: [id number]  Attending Physician Name and Address:  Florencia Reasons, MD  Relative Name and Phone Number:       Current Level of Care: Hospital Recommended Level of Care: [place] Prior Approval Number:    Date Approved/Denied:   PASRR Number: Manual review  Discharge Plan: SNF    Current Diagnoses: Patient Active Problem List   Diagnosis Date Noted  . Syncope 09/11/2018  . Sepsis ([place]) 09/11/2018  . Hypokalemia 09/11/2018  . Anemia in chronic kidney disease 12/14/2017  . Metabolic alkalosis 81/19/1478  . Chronic systolic heart failure ([place])   . Hypothyroidism   . Myxedema   . Pleural effusion, right   . Current use of long term anticoagulation 06/18/2017  . Sleep apnea 06/18/2017  . Obesity (BMI 30-39.9) 06/18/2017  . LBBB (left bundle branch block) 06/18/2017  . CKD (chronic kidney disease), stage III ([place])   . Persistent atrial fibrillation   . Dyspnea 11/27/2014  . Fatigue 11/27/2014  . AKI (acute kidney injury) ([place]) 11/27/2014  . Hyperlipidemia 02/14/2011  . Anxiety state 02/14/2011  . Depressive disorder 02/14/2011  . Hypertensive heart disease without CHF 02/14/2011  . CAD (coronary artery disease), native coronary artery 02/14/2011    Orientation RESPIRATION BLADDER Height & Weight     Self, Time, Situation, Place  Normal Continent Weight: 193 lb 12.8 oz (87.9 kg)(Scale B) Height:  5\' 4"  (162.6 cm)  BEHAVIORAL SYMPTOMS/MOOD NEUROLOGICAL BOWEL NUTRITION STATUS  (None) (None) Continent Diet(Heart healthy)  AMBULATORY STATUS COMMUNICATION OF NEEDS Skin   Extensive Assist Verbally Skin abrasions,  Bruising                       Personal Care Assistance Level of Assistance  Bathing, Feeding, Dressing Bathing Assistance: Limited assistance Feeding assistance: Independent Dressing Assistance: Maximum assistance     Functional Limitations Info  Sight, Hearing, Speech Sight Info: Adequate Hearing Info: Adequate Speech Info: Adequate    SPECIAL CARE FACTORS FREQUENCY  PT (By licensed PT), OT (By licensed OT)     PT Frequency: 5 x week OT Frequency: 5 x week            Contractures Contractures Info: Not present    Additional Factors Info  Code Status, Allergies, Psychotropic Code Status Info: Full Allergies Info: Ampicillin, Hydromorphone Hcl, Penicillins Psychotropic Info: Depression: No psychotropic meds.         Current Medications (09/13/2018):  This is the current hospital active medication list Current Facility-Administered Medications  Medication Dose Route Frequency Provider Last Rate Last Dose  . 0.9 %  sodium chloride infusion   Intravenous PRN Florencia Reasons, MD 10 mL/hr at 09/12/18 1113 1,000 mL at 09/12/18 1113  . acetaminophen (TYLENOL) tablet 650 mg  650 mg Oral Q6H PRN Edwin Dada, MD   650 mg at 09/13/18 2956   Or  . acetaminophen (TYLENOL) suppository 650 mg  650 mg Rectal Q6H PRN Danford, Suann Larry, MD      . amiodarone (PACERONE) tablet 200 mg  200 mg Oral Daily Edwin Dada, MD   200 mg at 09/13/18 0907  . apixaban (  ELIQUIS) tablet 2.5 mg  2.5 mg Oral BID Edwin Dada, MD   2.5 mg at 09/13/18 0907  . atorvastatin (LIPITOR) tablet 80 mg  80 mg Oral Daily Edwin Dada, MD   80 mg at 09/13/18 0907  . Influenza vac split quadrivalent PF (FLUZONE HIGH-DOSE) injection 0.5 mL  0.5 mL Intramuscular Tomorrow-1000 Danford, Suann Larry, MD      . levothyroxine (SYNTHROID, LEVOTHROID) tablet 200 mcg  200 mcg Oral QAC breakfast Edwin Dada, MD   200 mcg at 09/13/18 9150  . meropenem (MERREM) 1 g in  sodium chloride 0.9 % 100 mL IVPB  1 g Intravenous Q12H Wynell Balloon, RPH 200 mL/hr at 09/13/18 0904 1 g at 09/13/18 0904  . metoprolol succinate (TOPROL-XL) 24 hr tablet 12.5 mg  12.5 mg Oral Daily Florencia Reasons, MD   12.5 mg at 09/13/18 0907  . ondansetron (ZOFRAN) tablet 4 mg  4 mg Oral Q6H PRN Danford, Suann Larry, MD       Or  . ondansetron (ZOFRAN) injection 4 mg  4 mg Intravenous Q6H PRN Danford, Suann Larry, MD      . pantoprazole (PROTONIX) EC tablet 20 mg  20 mg Oral Daily PRN Danford, Suann Larry, MD      . polyethylene glycol (MIRALAX / GLYCOLAX) packet 17 g  17 g Oral Daily PRN Danford, Suann Larry, MD      . senna-docusate (Senokot-S) tablet 1 tablet  1 tablet Oral QHS PRN Danford, Suann Larry, MD      . senna-docusate (Senokot-S) tablet 1 tablet  1 tablet Oral BID Florencia Reasons, MD   1 tablet at 09/13/18 0907     Discharge Medications: Please see discharge summary for a list of discharge medications.  Relevant Imaging Results:  Relevant Lab Results:   Additional Information SS#: [id number]  Candie Chroman, LCSW

## 2018-09-13 NOTE — Progress Notes (Signed)
PROGRESS NOTE  RAAGA MAEDER HCW:[id number] DOB: 08-17-1932 DOA: 09/11/2018 PCP: Fanny Bien, MD  HPI/Recap of past 24 hours:  Report weight still up, legs remain swollen She denies ab pain, no dysuria Reports chronic intermittent headache ( since she was 30) No fever  Assessment/Plan: Principal Problem:   Sepsis ([place]) Active Problems:   AKI (acute kidney injury) ([place])   Persistent atrial fibrillation   CKD (chronic kidney disease), stage III (HCC)   Sleep apnea   LBBB (left bundle branch block)   Chronic systolic heart failure ([place])   Hypothyroidism   Syncope   Hypokalemia   Syncope with scalp hematoma: -per ED intake" syncopal episode that occurred when she stood from toilet; pt states she was unconscious for unnkown period of time and family found her and got her out of the floor; EMS reports pt was very pale on scene with large hematoma to L head and R knee abrasion/bruise; pt is on ELIQUIS; " -Ct head/CT cervical spine no acute findings,  -EKG paced rhythm ( will ask for pacemaker interrogation in am) -echo cardiogram lvef wnl, no WMA, + grade 2 diastolic dysfunction -Troponin 0.03. 0.03, she denies chest pain -she is found to have uti/aki which are likely the causes of syncope  UTI with sepsis presented on admission -wbc 38, AKI, elevated lactic acid of 3.1 on presentation -urine culture pan sensitive ecoli,  -she received meropenem since admission, change abx to bactrim ( h/o pcn allergy, never received cephalosporin)  AKI on CKDIII -cr 1.13 in 01/2018 -cr 1.98 on presentation -diuretic held on presentation, due to reports of significant weight gain on 10/16, she is given iv lasix 80mg  on 10/16 -cr  1.78 today , still volume overload on exam, will give another dose of lasix 80mg  iv on 10/17 -renal US no obstruction,  -Renal dosing meds, repeat bmp in am  Acute on chronic diastolic chf Weight gain of 20pounds she is given iv lasix 80mg  on 10/16  still  volume overload on exam, will give another dose of lasix 80mg  iv on 10/17   Chronic atrial fibrillation with h/o bradycardia s/p pacemaker placement:  Paced rhythm in the hospital CHA2DS2-Vasc 5, on Eliquis. -Continue apixaban -Continue amiodarone -Continue metoprolol (bp borderline low, decrease bb dose)  Hypothyroidism:  -Continue levothyroxine  FTT SNF  Code Status:  full  Family Communication: patient   Disposition Plan: snf when medically ready   Consultants:  none  Procedures:  none  Antibiotics:  Meropenem from admission to 10/17,   Bactrim from 10/17   Objective: BP (!) 143/44   Pulse 62   Temp 97.7 F (36.5 C) (Oral)   Resp 17   Ht 5\' 4"  (1.626 m)   Wt 87.9 kg Comment: Scale B  SpO2 94%   BMI 33.27 kg/m   Intake/Output Summary (Last 24 hours) at 09/13/2018 1829 Last data filed at 09/13/2018 1826 Gross per 24 hour  Intake 1180 ml  Output 2750 ml  Net -1570 ml   Filed Weights   09/12/18 0123 09/12/18 0608 09/13/18 0627  Weight: 87.8 kg 88 kg 87.9 kg    Exam: Patient is examined daily including today on 09/13/2018, exams remain the same as of yesterday except that has changed    General:  Frail, hard of hearing, NAD  Cardiovascular: paced rhythm  Respiratory: diminished at basis  Abdomen: Soft/ND/NT, positive BS  Musculoskeletal: trace bilateral lower extremity pitting Edema  Neuro: alert, oriented   Data Reviewed: Basic Metabolic Panel:  Recent Labs  Lab 09/11/18 0120 09/11/18 1447 09/12/18 0341 09/13/18 0456  NA 139  --  132* 133*  K 3.3*  --  4.0 3.9  CL 95*  --  101 98  CO2 24  --  22 27  GLUCOSE 213*  --  115* 109*  BUN 29*  --  31* 24*  CREATININE 1.98*  --  1.78* 1.78*  CALCIUM 10.1  --  8.6* 8.4*  MG  --  1.7  --   --    Liver Function Tests: No results for input(s): AST, ALT, ALKPHOS, BILITOT, PROT, ALBUMIN in the last 168 hours. No results for input(s): LIPASE, AMYLASE in the last 168 hours. No results  for input(s): AMMONIA in the last 168 hours. CBC: Recent Labs  Lab 09/11/18 0120 09/12/18 0341 09/13/18 0456  WBC 38.0* 21.2* 12.5*  NEUTROABS  --   --  8.3*  HGB 15.0 10.7* 10.1*  HCT 47.4* 32.5* 31.2*  MCV 95.2 94.8 95.4  PLT 322 192 205   Cardiac Enzymes:   Recent Labs  Lab 09/11/18 0120 09/11/18 0914 09/11/18 1447  CKTOTAL 58  --   --   TROPONINI  --  0.03* 0.03*   BNP (last 3 results) Recent Labs    11/24/17 1155  BNP 550.6*    ProBNP (last 3 results) No results for input(s): PROBNP in the last 8760 hours.  CBG: Recent Labs  Lab 09/11/18 0118  GLUCAP 216*    Recent Results (from the past 240 hour(s))  Urine culture     Status: Abnormal   Collection Time: 09/11/18  2:58 AM  Result Value Ref Range Status   Specimen Description URINE, RANDOM  Final   Special Requests   Final    NONE Performed at [facility], [place] [address] [place], [place] [address]    Culture >=100,000 COLONIES/mL ESCHERICHIA COLI (A)  Final   Report Status 09/13/2018 FINAL  Final   Organism ID, Bacteria ESCHERICHIA COLI (A)  Final      Susceptibility   Escherichia coli - MIC*    AMPICILLIN <=2 SENSITIVE Sensitive     CEFAZOLIN <=4 SENSITIVE Sensitive     CEFTRIAXONE <=1 SENSITIVE Sensitive     CIPROFLOXACIN <=0.25 SENSITIVE Sensitive     GENTAMICIN <=1 SENSITIVE Sensitive     IMIPENEM <=0.25 SENSITIVE Sensitive     NITROFURANTOIN <=16 SENSITIVE Sensitive     TRIMETH/SULFA <=20 SENSITIVE Sensitive     AMPICILLIN/SULBACTAM <=2 SENSITIVE Sensitive     PIP/TAZO <=4 SENSITIVE Sensitive     Extended ESBL NEGATIVE Sensitive     * >=100,000 COLONIES/mL ESCHERICHIA COLI  Culture, blood (routine x 2)     Status: None (Preliminary result)   Collection Time: 09/11/18  5:30 AM  Result Value Ref Range Status   Specimen Description BLOOD RIGHT ANTECUBITAL  Final   Special Requests   Final    BOTTLES DRAWN AEROBIC ONLY Blood Culture adequate volume   Culture   Final    NO GROWTH 2  DAYS Performed at [facility], [place] [address] [place],  [address]    Report Status PENDING  Incomplete  Culture, blood (routine x 2)     Status: None (Preliminary result)   Collection Time: 09/11/18  5:30 AM  Result Value Ref Range Status   Specimen Description BLOOD RIGHT ANTECUBITAL  Final   Special Requests   Final    BOTTLES DRAWN AEROBIC ONLY Blood Culture adequate volume  Culture   Final    NO GROWTH 2 DAYS Performed at [facility], [place] [address] [place],  [address]    Report Status PENDING  Incomplete     Studies: US Renal  Result Date: 09/13/2018 CLINICAL DATA:  Elevated creatinine EXAM: RENAL / URINARY TRACT ULTRASOUND COMPLETE COMPARISON:  06/19/2017 FINDINGS: Right Kidney: Length: 10.1 cm. Lobulated contour is again identified. No hydronephrosis or mass lesion is seen. Left Kidney: Length: 10.9 cm. Lobular contour is again identified. No mass lesion or hydronephrosis is seen. Bladder: Appears normal for degree of bladder distention. IMPRESSION: Stable appearing kidneys when compared with the prior study. No obstructive changes are noted. Electronically Signed   By: Inez Catalina M.D.   On: 09/13/2018 10:50    Scheduled Meds: . amiodarone  200 mg Oral Daily  . apixaban  2.5 mg Oral BID  . atorvastatin  80 mg Oral Daily  . furosemide  80 mg Intravenous Once  . Influenza vac split quadrivalent PF  0.5 mL Intramuscular Tomorrow-1000  . levothyroxine  200 mcg Oral QAC breakfast  . metoprolol succinate  12.5 mg Oral Daily  . senna-docusate  1 tablet Oral BID    Continuous Infusions: . sodium chloride 1,000 mL (09/12/18 1113)  . meropenem (MERREM) IV 1 g (09/13/18 6244)     Time spent: 22mins I have personally reviewed and interpreted on  09/13/2018 daily labs, imagings as discussed above under date review session and assessment and plans.  I reviewed all nursing notes, pharmacy notes,   vitals, pertinent old records  I have discussed  plan of care as described above with RN , patient on 09/13/2018   Florencia Reasons MD, [name]  [facility] Pager [phone]. If 7PM-7AM, please contact night-coverage at [email or web address], password [facility] 09/13/2018, 6:29 PM  LOS: 2 days

## 2018-09-13 NOTE — Progress Notes (Signed)
PT Cancellation Note  Patient Details Name: MADILYN CEPHAS MRN: [id number] DOB: 11-10-1932   Cancelled Treatment:    Reason Eval/Treat Not Completed: Patient at procedure or test/unavailable. Will follow-up for PT treatment as schedule permits.  Mabeline Caras, PT, DPT Acute Rehabilitation Services  Pager [phone] Office [place] 09/13/2018, 10:32 AM

## 2018-09-13 NOTE — Clinical Social Work Note (Signed)
Went by room to assess for SNF. Patient off the unit. Will try again later.  [name], [place]

## 2018-09-13 NOTE — Clinical Social Work Note (Signed)
Clinical Social Work Assessment  Patient Details  Name: [name] MRN: [id number] Date of Birth: [birth year]  Date of referral:  09/13/18               Reason for consult:  Facility Placement, Discharge Planning                Permission sought to share information with:  Chartered certified accountant granted to share information::  Yes, Verbal Permission Granted  Name::        Agency::  SNF's  Relationship::     Contact Information:     Housing/Transportation Living arrangements for the past 2 months:  Single Family Home Source of Information:  Patient, Medical Team Patient Interpreter Needed:  None Criminal Activity/Legal Involvement Pertinent to Current Situation/Hospitalization:  No - Comment as needed Significant Relationships:  Adult Children, Other Family Members Lives with:  Self Do you feel safe going back to the place where you live?  Yes Need for family participation in patient care:  Yes (Comment)  Care giving concerns:  PT recommending SNF once medically stable for discharge.   Social Worker assessment / plan:  CSW met with patient. No supports at bedside. CSW introduced role and explained that PT recommendations would be discussed. Patient willing to consider SNF but wants to see what her MD thinks first. First preference is [facility] due to proximity to her home and she was there in January. Referral sent and admissions coordinator aware. PASARR under manual review. Patient cannot go to SNF until PASARR obtained. No further concerns. CSW encouraged patient to contact CSW as needed. CSW will continue to follow patient for support and facilitate discharge to SNF once medically stable.  Employment status:  Retired Forensic scientist:  Medicare PT Recommendations:  [place] / Referral to community resources:  [place]  Patient/Family's Response to care:  Patient willing to consider SNF.  Patient's family supportive and involved in patient's care. Patient appreciated social work intervention.  Patient/Family's Understanding of and Emotional Response to Diagnosis, Current Treatment, and Prognosis:  Patient has a good understanding of the reason for admission and her need for continued therapy after discharge. Patient appears happy with hospital care.  Emotional Assessment Appearance:  Appears stated age Attitude/Demeanor/Rapport:  Engaged, Gracious Affect (typically observed):  Accepting, Appropriate, Calm, Pleasant Orientation:  Oriented to Self, Oriented to Place, Oriented to  Time, Oriented to Situation Alcohol / Substance use:  Never Used Psych involvement (Current and /or in the community):  No (Comment)  Discharge Needs  Concerns to be addressed:  Care Coordination Readmission within the last 30 days:  No Current discharge risk:  Dependent with Mobility, Lives alone Barriers to Discharge:  Continued Medical Work up, Other(Medicare inpatient day 2/3.)   Candie Chroman, LCSW 09/13/2018, 11:53 AM

## 2018-09-13 NOTE — Progress Notes (Signed)
RT NOTE:  Pt refuses CPAP tonight. RT available if pt changes mind.

## 2018-09-13 NOTE — Progress Notes (Signed)
Pharmacy Antibiotic Note  [name] is a 84 y.o. female admitted on 09/11/2018 with UTI.  Pharmacy has been consulted for Bactrim dosing. Pt previously on meropenem, CrCl ~25 ml/min.  Plan: Bactrim SS 400-80mg  PO q12h  Height: 5\' 4"  (162.6 cm) Weight: 193 lb 12.8 oz (87.9 kg)(Scale B) IBW/kg (Calculated) : 54.7  Temp (24hrs), Avg:97.9 F (36.6 C), Min:97.7 F (36.5 C), Max:98.2 F (36.8 C)  Recent Labs  Lab 09/11/18 0120 09/11/18 0914 09/11/18 1447 09/12/18 0341 09/13/18 0456  WBC 38.0*  --   --  21.2* 12.5*  CREATININE 1.98*  --   --  1.78* 1.78*  LATICACIDVEN  --  3.1* 1.7  --   --     Estimated Creatinine Clearance: 25.3 mL/min (A) (by C-G formula based on SCr of 1.78 mg/dL (H)).    Allergies  Allergen Reactions  . Ampicillin Shortness Of Breath, Swelling and Rash  . Hydromorphone Hcl Shortness Of Breath, Itching, Swelling and Rash  . Penicillins Anaphylaxis and Other (See Comments)    Has patient had a PCN reaction causing immediate rash, facial/tongue/throat swelling, SOB or lightheadedness with hypotension: Yes Has patient had a PCN reaction causing severe rash involving mucus membranes or skin necrosis: Unknown Has patient had a PCN reaction that required hospitalization: Yes (was in the hosp) Has patient had a PCN reaction occurring within the last 10 years: No If all of the above answers are "NO", then may proceed with Cephalosporin use.     Antimicrobials this admission: Meropenem 10/15 >> Cipro 10/15 x 1    Dose adjustments this admission: none  Microbiology results: 10/15 blood x 2 10/15 urine: e.coli (pan-S)  Thank you for allowing pharmacy to be a part of this patient's care.  Arrie Senate, PharmD, BCPS Clinical Pharmacist [phone] Please check AMION for all [place] numbers 09/13/2018

## 2018-09-14 ENCOUNTER — Other Ambulatory Visit: Payer: Self-pay | Admitting: Medical

## 2018-09-14 LAB — CBC WITH DIFFERENTIAL/PLATELET
Abs Immature Granulocytes: 0.04 10*3/uL (ref 0.00–0.07)
Basophils Absolute: 0.1 10*3/uL (ref 0.0–0.1)
Basophils Relative: 1 %
EOS PCT: 3 %
Eosinophils Absolute: 0.3 10*3/uL (ref 0.0–0.5)
HCT: 32.9 % — ABNORMAL LOW (ref 36.0–46.0)
HEMOGLOBIN: 10.6 g/dL — AB (ref 12.0–15.0)
Immature Granulocytes: 0 %
LYMPHS ABS: 2.4 10*3/uL (ref 0.7–4.0)
LYMPHS PCT: 22 %
MCH: 30.2 pg (ref 26.0–34.0)
MCHC: 32.2 g/dL (ref 30.0–36.0)
MCV: 93.7 fL (ref 80.0–100.0)
MONO ABS: 1.3 10*3/uL — AB (ref 0.1–1.0)
Monocytes Relative: 11 %
Neutro Abs: 6.9 10*3/uL (ref 1.7–7.7)
Neutrophils Relative %: 63 %
Platelets: 222 10*3/uL (ref 150–400)
RBC: 3.51 MIL/uL — AB (ref 3.87–5.11)
RDW: 13.5 % (ref 11.5–15.5)
WBC: 11 10*3/uL — ABNORMAL HIGH (ref 4.0–10.5)
nRBC: 0 % (ref 0.0–0.2)

## 2018-09-14 LAB — BASIC METABOLIC PANEL
ANION GAP: 11 (ref 5–15)
BUN: 25 mg/dL — ABNORMAL HIGH (ref 8–23)
CHLORIDE: 98 mmol/L (ref 98–111)
CO2: 26 mmol/L (ref 22–32)
Calcium: 8.8 mg/dL — ABNORMAL LOW (ref 8.9–10.3)
Creatinine, Ser: 1.87 mg/dL — ABNORMAL HIGH (ref 0.44–1.00)
GFR calc Af Amer: 27 mL/min — ABNORMAL LOW (ref >60)
GFR calc non Af Amer: 24 mL/min — ABNORMAL LOW (ref >60)
Glucose, Bld: 116 mg/dL — ABNORMAL HIGH (ref 70–99)
POTASSIUM: 3.6 mmol/L (ref 3.5–5.1)
SODIUM: 135 mmol/L (ref 135–145)

## 2018-09-14 LAB — MAGNESIUM: MAGNESIUM: 2 mg/dL (ref 1.7–2.4)

## 2018-09-14 MED ORDER — FUROSEMIDE 10 MG/ML IJ SOLN
80.0000 mg | Freq: Once | INTRAMUSCULAR | Status: AC
  Administered 2018-09-14: 80 mg via INTRAVENOUS
  Filled 2018-09-14: qty 8

## 2018-09-14 MED ORDER — TAMSULOSIN HCL 0.4 MG PO CAPS
0.4000 mg | ORAL_CAPSULE | Freq: Every day | ORAL | Status: DC
  Administered 2018-09-15 – 2018-09-16 (×2): 0.4 mg via ORAL
  Filled 2018-09-14 (×3): qty 1

## 2018-09-14 MED ORDER — POLYETHYLENE GLYCOL 3350 17 G PO PACK
17.0000 g | PACK | Freq: Every day | ORAL | Status: DC
  Administered 2018-09-14 – 2018-09-18 (×5): 17 g via ORAL
  Filled 2018-09-14 (×5): qty 1

## 2018-09-14 NOTE — Consult Note (Signed)
   [facility] Inpatient Consult   09/14/2018  [name] 11/29/1931 [id number]   Patient evaluated for community based chronic disease management services with [place] as a benefit of patient's Medicare Insurance. Spoke with patient at bedside, very [place], to explain [place] Management services.  Consent form signed. Folder with [place] Management information given.. Patient endorses Dr. Rachell Cipro as her primary care provider.  Patient will receive post hospital follow up at the facility for assessments and transition of care needs.  Will continue to follow for needs.   Left contact information and [facility] literature at bedside.  Of note, [facility] Care Management services does not replace or interfere with any services that are arranged by inpatient case management or social work.  For additional questions or referrals please contact:     Natividad Brood, RN BSN [facility] Liaison  [phone] business mobile phone Toll free office [phone]

## 2018-09-14 NOTE — Clinical Social Work Note (Signed)
Patient is agreeable to SNF placement at [facility]. Per MD, plan is for discharge tomorrow. SNF aware and agreeable. Patient will need PTAR.  Dayton Scrape, [place]

## 2018-09-14 NOTE — Progress Notes (Signed)
RT NOTE:  Pt refuses CPAP. RT available if pt changes her mind.

## 2018-09-14 NOTE — Progress Notes (Signed)
PROGRESS NOTE  [name] LOV:[id number] DOB: [birth year] DOA: 09/11/2018 PCP: Fanny Bien, MD  HPI/Recap of past 24 hours:  Report weight improving, but still 20pounds above base weight, legs less  Swollen C/o being constipated She denies ab pain, no dysuria Reports chronic intermittent headache ( since she was 30) No fever  Assessment/Plan: Principal Problem:   Sepsis ([place]) Active Problems:   AKI (acute kidney injury) ([place])   Persistent atrial fibrillation   CKD (chronic kidney disease), stage III (HCC)   Sleep apnea   LBBB (left bundle branch block)   Chronic systolic heart failure ([place])   Hypothyroidism   Syncope   Hypokalemia   Syncope with scalp hematoma: -per ED intake" syncopal episode that occurred when she stood from toilet; pt states she was unconscious for unnkown period of time and family found her and got her out of the floor; EMS reports pt was very pale on scene with large hematoma to L head and R knee abrasion/bruise; pt is on ELIQUIS; " -Ct head/CT cervical spine no acute findings,  -EKG paced rhythm -  pacemaker interrogated this am, function normal , pacing /sensing appropriately , no episodes correlate with syncope episode  -echo cardiogram lvef wnl, no WMA, + grade 2 diastolic dysfunction -Troponin 0.03. 0.03, she denies chest pain -she is found to have uti/aki which are likely the causes of syncope  UTI with sepsis presented on admission -wbc 38, AKI, elevated lactic acid of 3.1 on presentation -urine culture pan sensitive ecoli,  -she received meropenem since admission, change abx to bactrim ( h/o pcn allergy, never received cephalosporin)  AKI on CKDIII -cr 1.13 in 01/2018 -cr 1.98 on presentation -diuretic held on presentation, due to reports of significant weight gain , she is given iv lasix 80mg  on 10/16 and 10/17 -cr  1.87today , still volume overload on exam, will give another dose of lasix 80mg  iv on 10/18 -renal US no  obstruction,  -will do pVR -Renal dosing meds, repeat bmp in am  Acute on chronic diastolic chf Weight gain of 20pounds she is given iv lasix 80mg  daily since 10/16  still volume overload on exam,    Chronic atrial fibrillation with h/o bradycardia s/p pacemaker placement:  Paced rhythm in the hospital CHA2DS2-Vasc 5, on Eliquis. -Continue apixaban -Continue amiodarone -Continue metoprolol (bp borderline low, decrease bb dose)  Hypothyroidism:  -Continue levothyroxine  Constipation: stool softener  FTT SNF  Code Status:  full  Family Communication: patient   Disposition Plan: snf when medically ready, hopefully tomorrow, if cr improves   Consultants:  none  Procedures:  none  Antibiotics:  Meropenem from admission to 10/17,   Bactrim from 10/17   Objective: BP (!) 112/43   Pulse 66   Temp 98.6 F (37 C) (Oral)   Resp 19   Ht 5\' 4"  (1.626 m)   Wt 86.3 kg Comment: Scale B  SpO2 91%   BMI 32.66 kg/m   Intake/Output Summary (Last 24 hours) at 09/14/2018 1045 Last data filed at 09/14/2018 0954 Gross per 24 hour  Intake 1660 ml  Output 5280 ml  Net -3620 ml   Filed Weights   09/12/18 0608 09/13/18 0627 09/14/18 0453  Weight: 88 kg 87.9 kg 86.3 kg    Exam: Patient is examined daily including today on 09/14/2018, exams remain the same as of yesterday except that has changed    General:  Frail, hard of hearing, NAD  Cardiovascular: paced rhythm  Respiratory: diminished at  basis  Abdomen: Soft/ND/NT, positive BS  Musculoskeletal: trace bilateral lower extremity pitting Edema  Neuro: alert, oriented   Data Reviewed: Basic Metabolic Panel: Recent Labs  Lab 09/11/18 0120 09/11/18 1447 09/12/18 0341 09/13/18 0456 09/14/18 0430  NA 139  --  132* 133* 135  K 3.3*  --  4.0 3.9 3.6  CL 95*  --  101 98 98  CO2 24  --  22 27 26   GLUCOSE 213*  --  115* 109* 116*  BUN 29*  --  31* 24* 25*  CREATININE 1.98*  --  1.78* 1.78* 1.87*    CALCIUM 10.1  --  8.6* 8.4* 8.8*  MG  --  1.7  --   --  2.0   Liver Function Tests: No results for input(s): AST, ALT, ALKPHOS, BILITOT, PROT, ALBUMIN in the last 168 hours. No results for input(s): LIPASE, AMYLASE in the last 168 hours. No results for input(s): AMMONIA in the last 168 hours. CBC: Recent Labs  Lab 09/11/18 0120 09/12/18 0341 09/13/18 0456 09/14/18 0430  WBC 38.0* 21.2* 12.5* 11.0*  NEUTROABS  --   --  8.3* 6.9  HGB 15.0 10.7* 10.1* 10.6*  HCT 47.4* 32.5* 31.2* 32.9*  MCV 95.2 94.8 95.4 93.7  PLT 322 192 205 222   Cardiac Enzymes:   Recent Labs  Lab 09/11/18 0120 09/11/18 0914 09/11/18 1447  CKTOTAL 58  --   --   TROPONINI  --  0.03* 0.03*   BNP (last 3 results) Recent Labs    11/24/17 1155  BNP 550.6*    ProBNP (last 3 results) No results for input(s): PROBNP in the last 8760 hours.  CBG: Recent Labs  Lab 09/11/18 0118  GLUCAP 216*    Recent Results (from the past 240 hour(s))  Urine culture     Status: Abnormal   Collection Time: 09/11/18  2:58 AM  Result Value Ref Range Status   Specimen Description URINE, RANDOM  Final   Special Requests   Final    NONE Performed at [facility], [place] [address], [place], [place] [address]    Culture >=100,000 COLONIES/mL ESCHERICHIA COLI (A)  Final   Report Status 09/13/2018 FINAL  Final   Organism ID, Bacteria ESCHERICHIA COLI (A)  Final      Susceptibility   Escherichia coli - MIC*    AMPICILLIN <=2 SENSITIVE Sensitive     CEFAZOLIN <=4 SENSITIVE Sensitive     CEFTRIAXONE <=1 SENSITIVE Sensitive     CIPROFLOXACIN <=0.25 SENSITIVE Sensitive     GENTAMICIN <=1 SENSITIVE Sensitive     IMIPENEM <=0.25 SENSITIVE Sensitive     NITROFURANTOIN <=16 SENSITIVE Sensitive     TRIMETH/SULFA <=20 SENSITIVE Sensitive     AMPICILLIN/SULBACTAM <=2 SENSITIVE Sensitive     PIP/TAZO <=4 SENSITIVE Sensitive     Extended ESBL NEGATIVE Sensitive     * >=100,000 COLONIES/mL ESCHERICHIA COLI  Culture,  blood (routine x 2)     Status: None (Preliminary result)   Collection Time: 09/11/18  5:30 AM  Result Value Ref Range Status   Specimen Description BLOOD RIGHT ANTECUBITAL  Final   Special Requests   Final    BOTTLES DRAWN AEROBIC ONLY Blood Culture adequate volume   Culture   Final    NO GROWTH 3 DAYS Performed at [facility], [place] [address] [place], [place] [address]    Report Status PENDING  Incomplete  Culture, blood (routine x 2)     Status: None (  Preliminary result)   Collection Time: 09/11/18  5:30 AM  Result Value Ref Range Status   Specimen Description BLOOD RIGHT ANTECUBITAL  Final   Special Requests   Final    BOTTLES DRAWN AEROBIC ONLY Blood Culture adequate volume   Culture   Final    NO GROWTH 3 DAYS Performed at [facility], [address], [place], [place] [address]    Report Status PENDING  Incomplete     Studies: No results found.  Scheduled Meds: . amiodarone  200 mg Oral Daily  . apixaban  2.5 mg Oral BID  . atorvastatin  80 mg Oral Daily  . furosemide  80 mg Intravenous Once  . Influenza vac split quadrivalent PF  0.5 mL Intramuscular Tomorrow-1000  . levothyroxine  200 mcg Oral QAC breakfast  . metoprolol succinate  12.5 mg Oral Daily  . polyethylene glycol  17 g Oral Daily  . senna-docusate  1 tablet Oral BID  . sulfamethoxazole-trimethoprim  1 tablet Oral Q12H  . tamsulosin  0.4 mg Oral Daily    Continuous Infusions: . sodium chloride 1,000 mL (09/12/18 1113)     Time spent: 18mins, case discussed with cardiology, social worker I have personally reviewed and interpreted on  09/14/2018 daily labs, imagings as discussed above under date review session and assessment and plans.  I reviewed all nursing notes, pharmacy notes,   vitals, pertinent old records  I have discussed plan of care as described above with RN , patient on 09/14/2018   Florencia Reasons MD, [name]  [facility] Pager [phone]. If 7PM-7AM, please contact  night-coverage at [email or web address], password [facility] 09/14/2018, 10:45 AM  LOS: 3 days

## 2018-09-14 NOTE — Plan of Care (Signed)
  Problem: Skin Integrity: Goal: Risk for impaired skin integrity will decrease Outcome: Progressing   Problem: Education: Goal: Ability to demonstrate management of disease process will improve Outcome: Progressing Goal: Ability to verbalize understanding of medication therapies will improve Outcome: Progressing Goal: Individualized Educational Video(s) Outcome: Progressing   Problem: Education: Goal: Ability to verbalize understanding of medication therapies will improve Outcome: Progressing   Problem: Education: Goal: Individualized Educational Video(s) Outcome: Progressing   Problem: Activity: Goal: Capacity to carry out activities will improve Outcome: Progressing   Problem: Cardiac: Goal: Ability to achieve and maintain adequate cardiopulmonary perfusion will improve Outcome: Progressing

## 2018-09-14 NOTE — Progress Notes (Signed)
Physical Therapy Treatment Patient Details Name: [name] MRN: [id number] DOB: [birth year] Today's Date: 09/14/2018    History of Present Illness Pt is an 84 y/o female admitted 09/11/18 following syncopal episode. Pt with likely sepsis secondary to UTI. CT of head and c-spine negative for acute abnormality. R knee imaging revealed soft tissue hematoma. PMH includes a fib, CKD, CAD, CHF, HTN, pacemaker.    PT Comments    Pt slowly progressing with mobility. Remains limited by generalized weakness, fatigue, and decreased activity tolerance; this and slowed gait speed place pt at increased risk for falls. Pt motivated to regain strength and return to indep PLOF. Continue to recommend SNF-level therapies.     Follow Up Recommendations  SNF;Supervision/Assistance - 24 hour     Equipment Recommendations  None recommended by PT    Recommendations for Other Services       Precautions / Restrictions Precautions Precautions: Fall Restrictions Weight Bearing Restrictions: No    Mobility  Bed Mobility               General bed mobility comments: Received sitting in recliner  Transfers Overall transfer level: Needs assistance Equipment used: Rolling walker (2 wheeled) Transfers: Sit to/from Stand Sit to Stand: Supervision         General transfer comment: Increased time and effort, heavy reliance on UE support to push into standing  Ambulation/Gait Ambulation/Gait assistance: Min guard   Assistive device: Rolling walker (2 wheeled) Gait Pattern/deviations: Step-through pattern;Decreased stride length;Trunk flexed Gait velocity: Decreased Gait velocity interpretation: <1.8 ft/sec, indicate of risk for recurrent falls General Gait Details: Intermittent cues to maintain closer proximity to RW; min guard for balance. DOE 2/4, further distance limited by fatigue   Stairs             Wheelchair Mobility    Modified Rankin (Stroke Patients Only)        Balance Overall balance assessment: Needs assistance Sitting-balance support: No upper extremity supported;Feet supported Sitting balance-Leahy Scale: Fair     Standing balance support: No upper extremity supported;During functional activity Standing balance-Leahy Scale: Poor Standing balance comment: Reliant on UE support                            Cognition Arousal/Alertness: Awake/alert Behavior During Therapy: WFL for tasks assessed/performed Overall Cognitive Status: Within Functional Limits for tasks assessed                                        Exercises      General Comments General comments (skin integrity, edema, etc.): Son present at beginning of session       Pertinent Vitals/Pain Pain Assessment: Faces Faces Pain Scale: Hurts little more Pain Descriptors / Indicators: Guarding;Grimacing Pain Intervention(s): Monitored during session;Limited activity within patient's tolerance    Home Living                      Prior Function            PT Goals (current goals can now be found in the care plan section) Acute Rehab PT Goals Patient Stated Goal: Get stronger at home and return to indep at home PT Goal Formulation: With patient Time For Goal Achievement: 09/25/18 Potential to Achieve Goals: Good Progress towards PT goals: Progressing toward goals    Frequency  Min 2X/week      PT Plan Current plan remains appropriate    Co-evaluation              AM-PAC PT "6 Clicks" Daily Activity  Outcome Measure  Difficulty turning over in bed (including adjusting bedclothes, sheets and blankets)?: Unable Difficulty moving from lying on back to sitting on the side of the bed? : Unable Difficulty sitting down on and standing up from a chair with arms (e.g., wheelchair, bedside commode, etc,.)?: A Little Help needed moving to and from a bed to chair (including a wheelchair)?: A Little Help needed walking in  hospital room?: A Little Help needed climbing 3-5 steps with a railing? : A Lot 6 Click Score: 13    End of Session Equipment Utilized During Treatment: Gait belt Activity Tolerance: Patient tolerated treatment well Patient left: in chair;with call bell/phone within reach;with chair alarm set Nurse Communication: Mobility status PT Visit Diagnosis: Other abnormalities of gait and mobility (R26.89);Unsteadiness on feet (R26.81);Muscle weakness (generalized) (M62.81);History of falling (Z91.81)     Time: 1250-1302 PT Time Calculation (min) (ACUTE ONLY): 12 min  Charges:  $Gait Training: 8-22 mins                     Mabeline Caras, PT, DPT Acute Rehabilitation Services  Pager [phone] Office [place] 09/14/2018, 3:02 PM

## 2018-09-14 NOTE — Care Management Important Message (Signed)
Important Message  Patient Details  Name: [name] MRN: [id number] Date of Birth: [birth year]   Medicare Important Message Given:  Yes    [name] 09/14/2018, 3:50 PM

## 2018-09-15 LAB — HEPATIC FUNCTION PANEL
ALT: 17 U/L (ref 0–44)
AST: 23 U/L (ref 15–41)
Albumin: 2.7 g/dL — ABNORMAL LOW (ref 3.5–5.0)
Alkaline Phosphatase: 90 U/L (ref 38–126)
BILIRUBIN DIRECT: 0.1 mg/dL (ref 0.0–0.2)
BILIRUBIN INDIRECT: 0.3 mg/dL (ref 0.3–0.9)
BILIRUBIN TOTAL: 0.4 mg/dL (ref 0.3–1.2)
Total Protein: 5.5 g/dL — ABNORMAL LOW (ref 6.5–8.1)

## 2018-09-15 LAB — CBC WITH DIFFERENTIAL/PLATELET
Abs Immature Granulocytes: 0.05 10*3/uL (ref 0.00–0.07)
BASOS PCT: 1 %
Basophils Absolute: 0.1 10*3/uL (ref 0.0–0.1)
EOS ABS: 0.4 10*3/uL (ref 0.0–0.5)
EOS PCT: 4 %
HEMATOCRIT: 31.8 % — AB (ref 36.0–46.0)
Hemoglobin: 10.8 g/dL — ABNORMAL LOW (ref 12.0–15.0)
Immature Granulocytes: 1 %
LYMPHS ABS: 2.7 10*3/uL (ref 0.7–4.0)
Lymphocytes Relative: 25 %
MCH: 31.2 pg (ref 26.0–34.0)
MCHC: 34 g/dL (ref 30.0–36.0)
MCV: 91.9 fL (ref 80.0–100.0)
MONOS PCT: 11 %
Monocytes Absolute: 1.2 10*3/uL — ABNORMAL HIGH (ref 0.1–1.0)
Neutro Abs: 6.5 10*3/uL (ref 1.7–7.7)
Neutrophils Relative %: 58 %
PLATELETS: 224 10*3/uL (ref 150–400)
RBC: 3.46 MIL/uL — ABNORMAL LOW (ref 3.87–5.11)
RDW: 13.2 % (ref 11.5–15.5)
WBC: 10.9 10*3/uL — ABNORMAL HIGH (ref 4.0–10.5)
nRBC: 0 % (ref 0.0–0.2)

## 2018-09-15 LAB — BASIC METABOLIC PANEL
ANION GAP: 11 (ref 5–15)
BUN: 21 mg/dL (ref 8–23)
CALCIUM: 8.9 mg/dL (ref 8.9–10.3)
CO2: 26 mmol/L (ref 22–32)
CREATININE: 1.71 mg/dL — AB (ref 0.44–1.00)
Chloride: 95 mmol/L — ABNORMAL LOW (ref 98–111)
GFR, EST AFRICAN AMERICAN: 30 mL/min — AB (ref >60)
GFR, EST NON AFRICAN AMERICAN: 26 mL/min — AB (ref >60)
GLUCOSE: 108 mg/dL — AB (ref 70–99)
Potassium: 3.3 mmol/L — ABNORMAL LOW (ref 3.5–5.1)
Sodium: 132 mmol/L — ABNORMAL LOW (ref 135–145)

## 2018-09-15 MED ORDER — POTASSIUM CHLORIDE CRYS ER 20 MEQ PO TBCR
40.0000 meq | EXTENDED_RELEASE_TABLET | Freq: Once | ORAL | Status: AC
  Administered 2018-09-15: 40 meq via ORAL
  Filled 2018-09-15: qty 2

## 2018-09-15 MED ORDER — FUROSEMIDE 10 MG/ML IJ SOLN
80.0000 mg | Freq: Every day | INTRAMUSCULAR | Status: DC
  Administered 2018-09-15 – 2018-09-16 (×2): 80 mg via INTRAVENOUS
  Filled 2018-09-15 (×2): qty 8

## 2018-09-15 NOTE — Plan of Care (Signed)
  Problem: Clinical Measurements: Goal: Ability to maintain clinical measurements within normal limits will improve Outcome: Progressing   Problem: Clinical Measurements: Goal: Will remain free from infection Outcome: Progressing   Problem: Activity: Goal: Risk for activity intolerance will decrease Outcome: Progressing   Problem: Nutrition: Goal: Adequate nutrition will be maintained Outcome: Progressing   

## 2018-09-15 NOTE — Clinical Social Work Note (Addendum)
PASARR under manual review. 30 day note on front of chart for MD to sign. Paged MD to notify. Patient cannot go to SNF until PASARR obtained.  Dayton Scrape, [place] [phone]  11:34 am Updated patient. She hopes she does not have to discharge today because she already took her laxative.  Dayton Scrape, [place]

## 2018-09-15 NOTE — Progress Notes (Signed)
PROGRESS NOTE  [name]:[id number] DOB: 1932-07-12 DOA: 09/11/2018 PCP: Fanny Bien, MD  HPI/Recap of past 24 hours:   weight is improving, , legs less  Swollen Reports chronic intermittent headache ( since she was 30) No fever  Assessment/Plan: Principal Problem:   Sepsis ([place]) Active Problems:   AKI (acute kidney injury) ([place])   Persistent atrial fibrillation   CKD (chronic kidney disease), stage III (HCC)   Sleep apnea   LBBB (left bundle branch block)   Chronic systolic heart failure (HCC)   Hypothyroidism   Syncope   Hypokalemia   Syncope with scalp hematoma: -per ED intake" syncopal episode that occurred when she stood from toilet; pt states she was unconscious for unnkown period of time and family found her and got her out of the floor; EMS reports pt was very pale on scene with large hematoma to L head and R knee abrasion/bruise; pt is on ELIQUIS; " -Ct head/CT cervical spine no acute findings,  -EKG paced rhythm -  pacemaker interrogated this am, function normal , pacing /sensing appropriately , no episodes correlate with syncope episode  -echo cardiogram lvef wnl, no WMA, + grade 2 diastolic dysfunction -Troponin 0.03. 0.03, she denies chest pain -she is found to have uti/aki which are likely the causes of syncope  UTI with sepsis presented on admission -wbc 38, AKI, elevated lactic acid of 3.1 on presentation -urine culture pan sensitive ecoli,  -she received meropenem since admission, change abx to bactrim ( h/o pcn allergy, never received cephalosporin), total 7 days abx, last day on 10/21 -wbc trending down towards normal, wbc today 10.9  AKI on CKDIII -cr 1.13 in 01/2018 -cr 1.98 on presentation -diuretic held on presentation, due to significant weight gain , she is given iv lasix 80mg  daily since  10/16  --renal US no obstruction,  -will do pVR, discussed with RN at bedside -Renal dosing meds, repeat bmp in am  Acute on chronic  diastolic chf Weight gain of 20pounds she is given iv lasix 80mg  daily since 10/16  still volume overload on exam, continue iv lasix 80mg  daily  Hypokalemia: Replace k   Chronic atrial fibrillation with h/o bradycardia s/p pacemaker placement:  Paced rhythm in the hospital CHA2DS2-Vasc 5, on Eliquis. -Continue apixaban -Continue amiodarone -Continue metoprolol (bp borderline low, decrease bb dose), bp improving, likely able to restart home dose metoprolol at discharge  Hypothyroidism:  -Continue levothyroxine  Constipation: stool softener  FTT SNF  Code Status:  full  Family Communication: patient   Disposition Plan: snf , awaiting for PASARR   Consultants:  none  Procedures:  none  Antibiotics:  Meropenem from admission to 10/17,   Bactrim from 10/17   Objective: BP (!) 134/47   Pulse 76   Temp 97.9 F (36.6 C) (Oral)   Resp 20   Ht 5\' 4"  (1.626 m)   Wt 86.3 kg Comment: scale b  SpO2 96%   BMI 32.65 kg/m   Intake/Output Summary (Last 24 hours) at 09/15/2018 1641 Last data filed at 09/15/2018 1427 Gross per 24 hour  Intake 960 ml  Output 1525 ml  Net -565 ml   Filed Weights   09/13/18 0627 09/14/18 0453 09/15/18 0532  Weight: 87.9 kg 86.3 kg 86.3 kg    Exam: Patient is examined daily including today on 09/15/2018, exams remain the same as of yesterday except that has changed    General:  Frail, hard of hearing, NAD  Cardiovascular: paced rhythm  Respiratory: diminished at basis  Abdomen: Soft/ND/NT, positive BS  Musculoskeletal: trace bilateral lower extremity pitting Edema  Neuro: alert, oriented   Data Reviewed: Basic Metabolic Panel: Recent Labs  Lab 09/11/18 0120 09/11/18 1447 09/12/18 0341 09/13/18 0456 09/14/18 0430 09/15/18 0607  NA 139  --  132* 133* 135 132*  K 3.3*  --  4.0 3.9 3.6 3.3*  CL 95*  --  101 98 98 95*  CO2 24  --  22 27 26 26   GLUCOSE 213*  --  115* 109* 116* 108*  BUN 29*  --  31* 24* 25* 21    CREATININE 1.98*  --  1.78* 1.78* 1.87* 1.71*  CALCIUM 10.1  --  8.6* 8.4* 8.8* 8.9  MG  --  1.7  --   --  2.0  --    Liver Function Tests: Recent Labs  Lab 09/15/18 0607  AST 23  ALT 17  ALKPHOS 90  BILITOT 0.4  PROT 5.5*  ALBUMIN 2.7*   No results for input(s): LIPASE, AMYLASE in the last 168 hours. No results for input(s): AMMONIA in the last 168 hours. CBC: Recent Labs  Lab 09/11/18 0120 09/12/18 0341 09/13/18 0456 09/14/18 0430 09/15/18 0607  WBC 38.0* 21.2* 12.5* 11.0* 10.9*  NEUTROABS  --   --  8.3* 6.9 6.5  HGB 15.0 10.7* 10.1* 10.6* 10.8*  HCT 47.4* 32.5* 31.2* 32.9* 31.8*  MCV 95.2 94.8 95.4 93.7 91.9  PLT 322 192 205 222 224   Cardiac Enzymes:   Recent Labs  Lab 09/11/18 0120 09/11/18 0914 09/11/18 1447  CKTOTAL 58  --   --   TROPONINI  --  0.03* 0.03*   BNP (last 3 results) Recent Labs    11/24/17 1155  BNP 550.6*    ProBNP (last 3 results) No results for input(s): PROBNP in the last 8760 hours.  CBG: Recent Labs  Lab 09/11/18 0118  GLUCAP 216*    Recent Results (from the past 240 hour(s))  Urine culture     Status: Abnormal   Collection Time: 09/11/18  2:58 AM  Result Value Ref Range Status   Specimen Description URINE, RANDOM  Final   Special Requests   Final    NONE Performed at [facility], [place] [address] [place], [place] [address]    Culture >=100,000 COLONIES/mL ESCHERICHIA COLI (A)  Final   Report Status 09/13/2018 FINAL  Final   Organism ID, Bacteria ESCHERICHIA COLI (A)  Final      Susceptibility   Escherichia coli - MIC*    AMPICILLIN <=2 SENSITIVE Sensitive     CEFAZOLIN <=4 SENSITIVE Sensitive     CEFTRIAXONE <=1 SENSITIVE Sensitive     CIPROFLOXACIN <=0.25 SENSITIVE Sensitive     GENTAMICIN <=1 SENSITIVE Sensitive     IMIPENEM <=0.25 SENSITIVE Sensitive     NITROFURANTOIN <=16 SENSITIVE Sensitive     TRIMETH/SULFA <=20 SENSITIVE Sensitive     AMPICILLIN/SULBACTAM <=2 SENSITIVE Sensitive     PIP/TAZO  <=4 SENSITIVE Sensitive     Extended ESBL NEGATIVE Sensitive     * >=100,000 COLONIES/mL ESCHERICHIA COLI  Culture, blood (routine x 2)     Status: None (Preliminary result)   Collection Time: 09/11/18  5:30 AM  Result Value Ref Range Status   Specimen Description BLOOD RIGHT ANTECUBITAL  Final   Special Requests   Final    BOTTLES DRAWN AEROBIC ONLY Blood Culture adequate volume   Culture   Final    NO GROWTH  4 DAYS Performed at [facility], [place] [address], [place], [place] [address]    Report Status PENDING  Incomplete  Culture, blood (routine x 2)     Status: None (Preliminary result)   Collection Time: 09/11/18  5:30 AM  Result Value Ref Range Status   Specimen Description BLOOD RIGHT ANTECUBITAL  Final   Special Requests   Final    BOTTLES DRAWN AEROBIC ONLY Blood Culture adequate volume   Culture   Final    NO GROWTH 4 DAYS Performed at [facility], [place] [address], [place],  [address]    Report Status PENDING  Incomplete     Studies: No results found.  Scheduled Meds: . amiodarone  200 mg Oral Daily  . apixaban  2.5 mg Oral BID  . atorvastatin  80 mg Oral Daily  . furosemide  80 mg Intravenous Daily  . Influenza vac split quadrivalent PF  0.5 mL Intramuscular Tomorrow-1000  . levothyroxine  200 mcg Oral QAC breakfast  . metoprolol succinate  12.5 mg Oral Daily  . polyethylene glycol  17 g Oral Daily  . potassium chloride  40 mEq Oral Once  . senna-docusate  1 tablet Oral BID  . sulfamethoxazole-trimethoprim  1 tablet Oral Q12H  . tamsulosin  0.4 mg Oral Daily    Continuous Infusions: . sodium chloride 1,000 mL (09/12/18 1113)     Time spent: 16mins,  Case discussed with social worker I have personally reviewed and interpreted on  09/15/2018 daily labs, imagings as discussed above under date review session and assessment and plans.  I reviewed all nursing notes, pharmacy notes,   vitals, pertinent old records  I have discussed plan of  care as described above with RN , patient on 09/15/2018   Florencia Reasons MD, [name]  [facility] Pager [phone]. If 7PM-7AM, please contact night-coverage at [email or web address], password [facility] 09/15/2018, 4:41 PM  LOS: 4 days

## 2018-09-15 NOTE — Progress Notes (Signed)
RT NOTE: ? ?Pt refuses CPAP.  ?

## 2018-09-16 LAB — CBC WITH DIFFERENTIAL/PLATELET
Abs Immature Granulocytes: 0.06 10*3/uL (ref 0.00–0.07)
BASOS PCT: 1 %
Basophils Absolute: 0.1 10*3/uL (ref 0.0–0.1)
EOS ABS: 0.4 10*3/uL (ref 0.0–0.5)
Eosinophils Relative: 3 %
HEMATOCRIT: 32.6 % — AB (ref 36.0–46.0)
Hemoglobin: 10.6 g/dL — ABNORMAL LOW (ref 12.0–15.0)
IMMATURE GRANULOCYTES: 1 %
LYMPHS ABS: 2.3 10*3/uL (ref 0.7–4.0)
Lymphocytes Relative: 19 %
MCH: 30 pg (ref 26.0–34.0)
MCHC: 32.5 g/dL (ref 30.0–36.0)
MCV: 92.4 fL (ref 80.0–100.0)
MONO ABS: 1.4 10*3/uL — AB (ref 0.1–1.0)
MONOS PCT: 12 %
NEUTROS PCT: 64 %
Neutro Abs: 7.9 10*3/uL — ABNORMAL HIGH (ref 1.7–7.7)
PLATELETS: 237 10*3/uL (ref 150–400)
RBC: 3.53 MIL/uL — ABNORMAL LOW (ref 3.87–5.11)
RDW: 13.4 % (ref 11.5–15.5)
WBC: 12.1 10*3/uL — ABNORMAL HIGH (ref 4.0–10.5)
nRBC: 0 % (ref 0.0–0.2)

## 2018-09-16 LAB — BASIC METABOLIC PANEL
ANION GAP: 10 (ref 5–15)
BUN: 22 mg/dL (ref 8–23)
CALCIUM: 8.8 mg/dL — AB (ref 8.9–10.3)
CO2: 27 mmol/L (ref 22–32)
Chloride: 96 mmol/L — ABNORMAL LOW (ref 98–111)
Creatinine, Ser: 1.83 mg/dL — ABNORMAL HIGH (ref 0.44–1.00)
GFR calc Af Amer: 28 mL/min — ABNORMAL LOW (ref >60)
GFR calc non Af Amer: 24 mL/min — ABNORMAL LOW (ref >60)
GLUCOSE: 104 mg/dL — AB (ref 70–99)
POTASSIUM: 3.9 mmol/L (ref 3.5–5.1)
Sodium: 133 mmol/L — ABNORMAL LOW (ref 135–145)

## 2018-09-16 LAB — CULTURE, BLOOD (ROUTINE X 2)
CULTURE: NO GROWTH
Culture: NO GROWTH
SPECIAL REQUESTS: ADEQUATE
Special Requests: ADEQUATE

## 2018-09-16 MED ORDER — TAMSULOSIN HCL 0.4 MG PO CAPS
0.8000 mg | ORAL_CAPSULE | Freq: Every day | ORAL | Status: DC
  Administered 2018-09-17 – 2018-09-18 (×2): 0.8 mg via ORAL
  Filled 2018-09-16 (×2): qty 2

## 2018-09-16 MED ORDER — BISACODYL 10 MG RE SUPP: 10.0000 mg | Freq: Every day | RECTAL | Status: DC

## 2018-09-16 MED ORDER — BISACODYL 10 MG RE SUPP: 10.0000 mg | Freq: Every day | RECTAL | Status: AC

## 2018-09-16 MED ORDER — FUROSEMIDE 10 MG/ML IJ SOLN
80.0000 mg | Freq: Two times a day (BID) | INTRAMUSCULAR | Status: DC
  Administered 2018-09-16 – 2018-09-17 (×3): 80 mg via INTRAVENOUS
  Filled 2018-09-16 (×3): qty 8

## 2018-09-16 NOTE — Plan of Care (Signed)
  Problem: Education: Goal: Knowledge of General Education information will improve Description Including pain rating scale, medication(s)/side effects and non-pharmacologic comfort measures Outcome: Progressing   Problem: Health Behavior/Discharge Planning: Goal: Ability to manage health-related needs will improve Outcome: Progressing   

## 2018-09-16 NOTE — Progress Notes (Signed)
Attempted to get patient to sit up for dinner, pt refused, stating she is tired. She would like to go to chair for breakfast tomorrow.

## 2018-09-16 NOTE — Progress Notes (Deleted)
Clinical Social Worker facilitated patient discharge including contacting patient family and facility to confirm patient discharge plans.  Clinical information faxed to facility and family agreeable with plan.  CSW arranged ambulance transport via [facility] to [facility] .  RN to call for report prior to discharge [phone] RM [phone].  Clinical Social Worker will sign off for now as social work intervention is no longer needed. Please consult [place] again if new need arises.  Mah[employer]ad [employer]  [phone]

## 2018-09-16 NOTE — Progress Notes (Signed)
PROGRESS NOTE  [name]:[id number] DOB: 12-06-1931 DOA: 09/11/2018 PCP: Fanny Bien, MD  HPI/Recap of past 24 hours:   weight is improving, but still up ( report base weight 170lb, she is 190 lb today), legs are  less  Swollen C/o lower abdominal discomfort  Reports chronic intermittent headache ( since she was 30) No fever  Assessment/Plan: Principal Problem:   Sepsis ([place]) Active Problems:   AKI (acute kidney injury) ([place])   Persistent atrial fibrillation   CKD (chronic kidney disease), stage III (HCC)   Sleep apnea   LBBB (left bundle branch block)   Chronic systolic heart failure ([place])   Hypothyroidism   Syncope   Hypokalemia   Syncope with scalp hematoma: -per ED intake" syncopal episode that occurred when she stood from toilet; pt states she was unconscious for unnkown period of time and family found her and got her out of the floor; EMS reports pt was very pale on scene with large hematoma to L head and R knee abrasion/bruise; pt is on ELIQUIS; " -Ct head/CT cervical spine no acute findings,  -EKG paced rhythm -  pacemaker interrogated on 10/18, function normal , pacing /sensing appropriately , no episodes correlate with syncope episode  -echo cardiogram lvef wnl, no WMA, + grade 2 diastolic dysfunction -Troponin 0.03. 0.03, she denies chest pain -she is found to have uti/aki which are likely the causes of syncope  UTI with sepsis presented on admission -wbc 38, AKI, elevated lactic acid of 3.1 on presentation -urine culture pan sensitive ecoli,  -she received meropenem since admission, change abx to bactrim ( h/o pcn allergy, never received cephalosporin), total 7 days abx, last day on 10/21 -wbc trending down towards normal, wbc  10.9 0n 10/19, wbc 12 on 10/20, continue to monitor  AKI on CKDIII -cr 1.13 in 01/2018 -cr 1.98 on presentation -diuretic held on presentation, due to significant weight gain , she is given iv lasix 80mg  daily since   10/16  --renal US no obstruction,  --Renal dosing meds, repeat bmp in am  PVR 153 cc patient reports lower abdominal discomfort today   will insert foley, increase flomax dose, treat constipation, mobilize.   Acute on chronic diastolic chf Weight gain of 20pounds she is given iv lasix 80mg  daily since 10/16  still volume overload on exam, increase  iv lasix 80mg   From daily to BID  Hypokalemia: Replace k, normalized ,repeat in am.   Chronic atrial fibrillation with h/o bradycardia s/p pacemaker placement:  Paced rhythm in the hospital CHA2DS2-Vasc 5, on Eliquis. -Continue apixaban -Continue amiodarone -Continue metoprolol (bp borderline low, decrease bb dose), bp improving, likely able to restart home dose metoprolol at discharge  Hypothyroidism:  -Continue levothyroxine  Constipation: stool softener  FTT SNF  Code Status:  full  Family Communication: patient   Disposition Plan: snf , awaiting for PASARR   Consultants:  none  Procedures:  none  Antibiotics:  Meropenem from admission to 10/17,   Bactrim from 10/17   Objective: BP (!) 128/48   Pulse 72   Temp 97.7 F (36.5 C) (Oral)   Resp 20   Ht 5\' 4"  (1.626 m)   Wt 86.3 kg Comment: scale b  SpO2 93%   BMI 32.66 kg/m   Intake/Output Summary (Last 24 hours) at 09/16/2018 1344 Last data filed at 09/16/2018 1109 Gross per 24 hour  Intake 1520.2 ml  Output 4200 ml  Net -2679.8 ml   Filed Weights   09/14/18  8676 09/15/18 0532 09/16/18 0151  Weight: 86.3 kg 86.3 kg 86.3 kg    Exam: Patient is examined daily including today on 09/16/2018, exams remain the same as of yesterday except that has changed    General:  Frail, hard of hearing, NAD  Cardiovascular: paced rhythm  Respiratory: diminished at basis, no wheezing  Abdomen: Soft/ND/NT, positive BS  Musculoskeletal: trace bilateral lower extremity pitting Edema that is improving  Neuro: alert, oriented   Data Reviewed: Basic  Metabolic Panel: Recent Labs  Lab 09/11/18 1447 09/12/18 0341 09/13/18 0456 09/14/18 0430 09/15/18 0607 09/16/18 0619  NA  --  132* 133* 135 132* 133*  K  --  4.0 3.9 3.6 3.3* 3.9  CL  --  101 98 98 95* 96*  CO2  --  22 27 26 26 27   GLUCOSE  --  115* 109* 116* 108* 104*  BUN  --  31* 24* 25* 21 22  CREATININE  --  1.78* 1.78* 1.87* 1.71* 1.83*  CALCIUM  --  8.6* 8.4* 8.8* 8.9 8.8*  MG 1.7  --   --  2.0  --   --    Liver Function Tests: Recent Labs  Lab 09/15/18 0607  AST 23  ALT 17  ALKPHOS 90  BILITOT 0.4  PROT 5.5*  ALBUMIN 2.7*   No results for input(s): LIPASE, AMYLASE in the last 168 hours. No results for input(s): AMMONIA in the last 168 hours. CBC: Recent Labs  Lab 09/12/18 0341 09/13/18 0456 09/14/18 0430 09/15/18 0607 09/16/18 0619  WBC 21.2* 12.5* 11.0* 10.9* 12.1*  NEUTROABS  --  8.3* 6.9 6.5 7.9*  HGB 10.7* 10.1* 10.6* 10.8* 10.6*  HCT 32.5* 31.2* 32.9* 31.8* 32.6*  MCV 94.8 95.4 93.7 91.9 92.4  PLT 192 205 222 224 237   Cardiac Enzymes:   Recent Labs  Lab 09/11/18 0120 09/11/18 0914 09/11/18 1447  CKTOTAL 58  --   --   TROPONINI  --  0.03* 0.03*   BNP (last 3 results) Recent Labs    11/24/17 1155  BNP 550.6*    ProBNP (last 3 results) No results for input(s): PROBNP in the last 8760 hours.  CBG: Recent Labs  Lab 09/11/18 0118  GLUCAP 216*    Recent Results (from the past 240 hour(s))  Urine culture     Status: Abnormal   Collection Time: 09/11/18  2:58 AM  Result Value Ref Range Status   Specimen Description URINE, RANDOM  Final   Special Requests   Final    NONE Performed at [facility], [place] [address], [place], [place] [address]    Culture >=100,000 COLONIES/mL ESCHERICHIA COLI (A)  Final   Report Status 09/13/2018 FINAL  Final   Organism ID, Bacteria ESCHERICHIA COLI (A)  Final      Susceptibility   Escherichia coli - MIC*    AMPICILLIN <=2 SENSITIVE Sensitive     CEFAZOLIN <=4 SENSITIVE Sensitive      CEFTRIAXONE <=1 SENSITIVE Sensitive     CIPROFLOXACIN <=0.25 SENSITIVE Sensitive     GENTAMICIN <=1 SENSITIVE Sensitive     IMIPENEM <=0.25 SENSITIVE Sensitive     NITROFURANTOIN <=16 SENSITIVE Sensitive     TRIMETH/SULFA <=20 SENSITIVE Sensitive     AMPICILLIN/SULBACTAM <=2 SENSITIVE Sensitive     PIP/TAZO <=4 SENSITIVE Sensitive     Extended ESBL NEGATIVE Sensitive     * >=100,000 COLONIES/mL ESCHERICHIA COLI  Culture, blood (routine x 2)     Status: None  Collection Time: 09/11/18  5:30 AM  Result Value Ref Range Status   Specimen Description BLOOD RIGHT ANTECUBITAL  Final   Special Requests   Final    BOTTLES DRAWN AEROBIC ONLY Blood Culture adequate volume   Culture   Final    NO GROWTH 5 DAYS Performed at [facility], [place] [address], [place], [place] [address]    Report Status 09/16/2018 FINAL  Final  Culture, blood (routine x 2)     Status: None   Collection Time: 09/11/18  5:30 AM  Result Value Ref Range Status   Specimen Description BLOOD RIGHT ANTECUBITAL  Final   Special Requests   Final    BOTTLES DRAWN AEROBIC ONLY Blood Culture adequate volume   Culture   Final    NO GROWTH 5 DAYS Performed at [facility], [place] [address], [place], [place] [address]    Report Status 09/16/2018 FINAL  Final     Studies: No results found.  Scheduled Meds: . amiodarone  200 mg Oral Daily  . apixaban  2.5 mg Oral BID  . atorvastatin  80 mg Oral Daily  . bisacodyl  10 mg Rectal Daily  . furosemide  80 mg Intravenous BID  . Influenza vac split quadrivalent PF  0.5 mL Intramuscular Tomorrow-1000  . levothyroxine  200 mcg Oral QAC breakfast  . metoprolol succinate  12.5 mg Oral Daily  . polyethylene glycol  17 g Oral Daily  . senna-docusate  1 tablet Oral BID  . sulfamethoxazole-trimethoprim  1 tablet Oral Q12H  . [START ON 09/17/2018] tamsulosin  0.8 mg Oral Daily    Continuous Infusions: . sodium chloride 1,000 mL (09/12/18 1113)     Time spent: 29mins,   Case discussed with social worker I have personally reviewed and interpreted on  09/16/2018 daily labs, imagings as discussed above under date review session and assessment and plans.  I reviewed all nursing notes, pharmacy notes,   vitals, pertinent old records  I have discussed plan of care as described above with RN , patient on 09/16/2018   Florencia Reasons MD, [name]  [facility] Pager [phone]. If 7PM-7AM, please contact night-coverage at [email or web address], password [facility] 09/16/2018, 1:44 PM  LOS: 5 days

## 2018-09-16 NOTE — Progress Notes (Signed)
Submmited clinical information for Pass[facility]. Awaiting confirmation.

## 2018-09-16 NOTE — Progress Notes (Signed)
Patient sleeping during shift report.      

## 2018-09-17 DIAGNOSIS — S8001XA Contusion of right knee, initial encounter: Secondary | ICD-10-CM

## 2018-09-17 DIAGNOSIS — I480 Paroxysmal atrial fibrillation: Secondary | ICD-10-CM

## 2018-09-17 DIAGNOSIS — I5033 Acute on chronic diastolic (congestive) heart failure: Secondary | ICD-10-CM

## 2018-09-17 LAB — BASIC METABOLIC PANEL
ANION GAP: 9 (ref 5–15)
BUN: 21 mg/dL (ref 8–23)
CALCIUM: 8.6 mg/dL — AB (ref 8.9–10.3)
CO2: 29 mmol/L (ref 22–32)
Chloride: 93 mmol/L — ABNORMAL LOW (ref 98–111)
Creatinine, Ser: 1.85 mg/dL — ABNORMAL HIGH (ref 0.44–1.00)
GFR calc non Af Amer: 24 mL/min — ABNORMAL LOW (ref >60)
GFR, EST AFRICAN AMERICAN: 28 mL/min — AB (ref >60)
GLUCOSE: 116 mg/dL — AB (ref 70–99)
POTASSIUM: 3.6 mmol/L (ref 3.5–5.1)
SODIUM: 131 mmol/L — AB (ref 135–145)

## 2018-09-17 LAB — CBC WITH DIFFERENTIAL/PLATELET
Abs Immature Granulocytes: 0.05 10*3/uL (ref 0.00–0.07)
BASOS ABS: 0.1 10*3/uL (ref 0.0–0.1)
Basophils Relative: 1 %
EOS ABS: 0.3 10*3/uL (ref 0.0–0.5)
EOS PCT: 3 %
HEMATOCRIT: 32.2 % — AB (ref 36.0–46.0)
HEMOGLOBIN: 10.6 g/dL — AB (ref 12.0–15.0)
Immature Granulocytes: 1 %
LYMPHS ABS: 2 10*3/uL (ref 0.7–4.0)
LYMPHS PCT: 19 %
MCH: 30.6 pg (ref 26.0–34.0)
MCHC: 32.9 g/dL (ref 30.0–36.0)
MCV: 93.1 fL (ref 80.0–100.0)
Monocytes Absolute: 1.3 10*3/uL — ABNORMAL HIGH (ref 0.1–1.0)
Monocytes Relative: 13 %
NRBC: 0 % (ref 0.0–0.2)
Neutro Abs: 6.6 10*3/uL (ref 1.7–7.7)
Neutrophils Relative %: 63 %
PLATELETS: 229 10*3/uL (ref 150–400)
RBC: 3.46 MIL/uL — ABNORMAL LOW (ref 3.87–5.11)
RDW: 13.3 % (ref 11.5–15.5)
WBC: 10.4 10*3/uL (ref 4.0–10.5)

## 2018-09-17 MED ORDER — POTASSIUM CHLORIDE CRYS ER 20 MEQ PO TBCR
40.0000 meq | EXTENDED_RELEASE_TABLET | Freq: Once | ORAL | Status: AC
  Administered 2018-09-17: 40 meq via ORAL
  Filled 2018-09-17: qty 2

## 2018-09-17 MED ORDER — LACTULOSE 10 GM/15ML PO SOLN
10.0000 g | Freq: Every day | ORAL | Status: DC
  Administered 2018-09-17 – 2018-09-18 (×2): 10 g via ORAL
  Filled 2018-09-17 (×2): qty 15

## 2018-09-17 MED ORDER — TAMSULOSIN HCL 0.4 MG PO CAPS: 0.8000 mg | ORAL_CAPSULE | Freq: Every day | ORAL | 0 refills | Status: AC

## 2018-09-17 MED ORDER — POLYETHYLENE GLYCOL 3350 17 G PO PACK: 17.0000 g | PACK | Freq: Every day | ORAL | 0 refills | Status: DC

## 2018-09-17 NOTE — Progress Notes (Signed)
09/17/18 1250  PT Visit Information  Last PT Received On 09/17/18  Assistance Needed +1  History of Present Illness Pt is an 84 y/o female admitted 09/11/18 following syncopal episode. Pt with likely sepsis secondary to UTI. CT of head and c-spine negative for acute abnormality. R knee imaging revealed soft tissue hematoma. PMH includes a fib, CKD, CAD, CHF, HTN, pacemaker.   Subjective Data  Patient Stated Goal Get stronger at home and return to indep at home  Precautions  Precautions Fall  Precaution Comments watch BP gets orthostatic  Restrictions  Weight Bearing Restrictions No  Pain Assessment  Pain Assessment No/denies pain  Cognition  Arousal/Alertness Awake/alert  Behavior During Therapy [facility] for tasks assessed/performed  Overall Cognitive Status Within Functional Limits for tasks assessed  Bed Mobility  General bed mobility comments In chair upon entry   Transfers  Overall transfer level Needs assistance  Equipment used Rolling walker (2 wheeled)  Transfers Sit to/from Stand  Sit to [place] transfer comment Increased time and effort, heavy reliance on UE support to push into standing  Ambulation/Gait  Ambulation/Gait assistance Min guard  Gait Distance (Feet) 20 Feet (10', 10' )  Assistive device Rolling walker (2 wheeled)  Gait Pattern/deviations Step-through pattern;Decreased stride length;Trunk flexed  General Gait Details Min guard for steadying assist. Pt easily fatigued and required seated rest when reaching door of room. Oxygen sats at 98% on 1L of oxygen.   Gait velocity Decreased  Balance  Overall balance assessment Needs assistance  Sitting-balance support No upper extremity supported;Feet supported  Sitting balance-Leahy Scale Good  Standing balance support Bilateral upper extremity supported;During functional activity  Standing balance-Leahy Scale Poor  Standing balance comment Reliant on UE support  Exercises  Exercises General  Lower Extremity  General Exercises - Lower Extremity  Ankle Circles/Pumps AROM;Both;Seated  Quad Sets AROM;Both;Seated  Long Arc Quad AROM;Both;10 reps;Seated  Hip Flexion/Marching AROM;Both;10 reps;Seated  PT - End of Session  Equipment Utilized During Treatment Gait belt  Activity Tolerance Patient limited by fatigue  Patient left in chair;with call bell/phone within reach;with chair alarm set  Nurse Communication Mobility status   PT - Assessment/Plan  PT Plan Current plan remains appropriate  PT Visit Diagnosis Other abnormalities of gait and mobility (R26.89);Unsteadiness on feet (R26.81);Muscle weakness (generalized) (M62.81);History of falling (Z91.81)  PT Frequency (ACUTE ONLY) Min 2X/week  Recommendations for Other Services OT consult  Follow Up Recommendations SNF;Supervision/Assistance - 24 hour  PT equipment None recommended by PT  AM-PAC PT "6 Clicks" Daily Activity Outcome Measure  Difficulty turning over in bed (including adjusting bedclothes, sheets and blankets)? 1  Difficulty moving from lying on back to sitting on the side of the bed?  1  Difficulty sitting down on and standing up from a chair with arms (e.g., wheelchair, bedside commode, etc,.)? 1  Help needed moving to and from a bed to chair (including a wheelchair)? 3  Help needed walking in hospital room? 3  Help needed climbing 3-5 steps with a railing?  2  6 Click Score 11  Mobility G Code  CL  PT Goal Progression  Progress towards PT goals Progressing toward goals  Acute Rehab PT Goals  PT Goal Formulation With patient  Time For Goal Achievement 09/25/18  Potential to Achieve Goals Good  PT Time Calculation  PT Start Time (ACUTE ONLY) 1218  PT Stop Time (ACUTE ONLY) 1238  PT Time Calculation (min) (ACUTE ONLY) 20 min  PT General Charges  $$ ACUTE  PT VISIT 1 Visit  PT Treatments  $Therapeutic Activity 8-22 mins   Pt with slow progression towards goals. Remains limited by decreased activity  tolerance. Pt requiring seated rest during short distance ambulation within the room. Oxygen sats at 98% on 1L of oxygen. Educated and performed seated HEP. Current recommendations appropriate. Will continue to follow acutely to maximize functional mobility independence and safety.   Leighton Ruff, PT, DPT  Acute Rehabilitation Services  Pager: [phone] Office: [phone]

## 2018-09-17 NOTE — Clinical Social Work Note (Addendum)
PASARR still pending.  Dayton Scrape, CSW [phone]  2:30 pm PASARR still pending.  Dayton Scrape, [place]

## 2018-09-17 NOTE — Progress Notes (Signed)
Occupational Therapy Treatment Patient Details Name: [name] MRN: [id number] DOB: 12/12/1931 Today's Date: 09/17/2018    History of present illness Pt is an 84 y/o female admitted 09/11/18 following syncopal episode. Pt with likely sepsis secondary to UTI. CT of head and c-spine negative for acute abnormality. R knee imaging revealed soft tissue hematoma. PMH includes a fib, CKD, CAD, CHF, HTN, pacemaker.    OT comments  Pt making slow progress to all set goals. Pt is most limited by her activity tolerance. Pt requests to sit during most activities but did encourage her to do more in standing so she can start to tolerate standing more.  Pt needs to be educated on energy conservation techniques so she can find a happy medium between increasing her tolerance for standing and being able to complete activities for an entire day.  Remains appropriate for SNF.   Follow Up Recommendations  SNF;Supervision/Assistance - 24 hour    Equipment Recommendations  None recommended by OT    Recommendations for Other Services      Precautions / Restrictions Precautions Precautions: Fall Precaution Comments: watch BP gets orthostatic Restrictions Weight Bearing Restrictions: No       Mobility Bed Mobility Overal bed mobility: Needs Assistance Bed Mobility: Supine to Sit     Supine to sit: Supervision        Transfers Overall transfer level: Needs assistance Equipment used: Rolling walker (2 wheeled) Transfers: Sit to/from Stand Sit to Stand: Supervision Stand pivot transfers: Min guard       General transfer comment: Increased time and effort, heavy reliance on UE support to push into standing    Balance Overall balance assessment: Needs assistance Sitting-balance support: No upper extremity supported;Feet supported Sitting balance-Leahy Scale: Good     Standing balance support: Bilateral upper extremity supported;During functional activity Standing balance-Leahy Scale:  Poor Standing balance comment: Reliant on UE support                           ADL either performed or assessed with clinical judgement   ADL Overall ADL's : Needs assistance/impaired Eating/Feeding: Independent   Grooming: Set up;Sitting;Standing Grooming Details (indicate cue type and reason): Pt stood for about 30 seconds and then asked to sit to finish grooming. When encouraged to stand 30 more seconds, pt stated she would fall if she stood longer.  Talked at length with pt about increasing her standing tolerance.                  Toilet Transfer: Minimal assistance;Ambulation;RW Toilet Transfer Details (indicate cue type and reason): Pt walked to bathroom to toilet despite wanting to use the BSC.  Spoke with pt about the need to walk to bathroom instead of using the [facility].  Pt agreed and did so with min guard to walk and min assist to get of the commode. Toileting- Water quality scientist and Hygiene: Min guard;Sit to/from stand Toileting - Clothing Manipulation Details (indicate cue type and reason): Pt able to get off comode on 2nd attempt with no physical assist.  Cues for hand placement.     Functional mobility during ADLs: Min guard;Rolling walker General ADL Comments: Pt did not feel dizzy during session today.  Need to encourage more activities in standing.     Vision       Perception     Praxis      Cognition Arousal/Alertness: Awake/alert Behavior During Therapy: WFL for tasks assessed/performed Overall Cognitive Status:  Within Functional Limits for tasks assessed                                 General Comments: Pt encouraged to do more task in standing today.  Pt states she cannot tolerate standing for more than 30 seconds or so but states she things she could manage at home.  Took some time to explain to her that she will need to attemt more time/tasks in standing to see if she can tolerate being at home.         Exercises      Shoulder Instructions       General Comments Pt able to complete most adls given extra time. Pt will benefit from energy conservation education to figure out how to tolerate more activity.    Pertinent Vitals/ Pain       Pain Assessment: No/denies pain  Home Living                                          Prior Functioning/Environment              Frequency  Min 2X/week        Progress Toward Goals  OT Goals(current goals can now be found in the care plan section)  Progress towards OT goals: Progressing toward goals  Acute Rehab OT Goals Patient Stated Goal: Get stronger at home and return to indep at home OT Goal Formulation: With patient Time For Goal Achievement: 09/26/18 Potential to Achieve Goals: Good ADL Goals Pt Will Perform Grooming: with supervision;standing Pt Will Perform Lower Body Bathing: with supervision;sit to/from stand Pt Will Perform Lower Body Dressing: with supervision;sit to/from stand;with adaptive equipment Pt Will Transfer to Toilet: with supervision;ambulating;bedside commode Pt Will Perform Tub/Shower Transfer: ambulating;with min guard assist;Shower transfer;shower seat Additional ADL Goal #1: pt will initiate rest break without cues for energy conservation and verbalize 3 strategies  Plan Discharge plan remains appropriate    Co-evaluation                 AM-PAC PT "6 Clicks" Daily Activity     Outcome Measure   Help from another person eating meals?: None Help from another person taking care of personal grooming?: None Help from another person toileting, which includes using toliet, bedpan, or urinal?: A Little Help from another person bathing (including washing, rinsing, drying)?: None Help from another person to put on and taking off regular upper body clothing?: None Help from another person to put on and taking off regular lower body clothing?: A Little 6 Click Score: 22    End of Session  Equipment Utilized During Treatment: Rolling walker;Oxygen  OT Visit Diagnosis: Unsteadiness on feet (R26.81);Muscle weakness (generalized) (M62.81)   Activity Tolerance Patient tolerated treatment well   Patient Left in chair;with call bell/phone within reach;with chair alarm set   Nurse Communication Mobility status        Time: 4010-2725 OT Time Calculation (min): 19 min  Charges: OT General Charges $OT Visit: 1 Visit OT Treatments $Self Care/Home Management : 8-22 mins  Jinger Neighbors, OTR/L [phone]   Glenford Peers 09/17/2018, 12:22 PM

## 2018-09-17 NOTE — Progress Notes (Signed)
3:38pm-pasarr remains pending  Thurmond Butts, [place] Social Worker [phone]

## 2018-09-17 NOTE — Discharge Summary (Addendum)
Discharge Summary  [name] WRU:[id number] DOB: 01/31/1932  PCP: Fanny Bien, MD  Admit date: 09/11/2018 Discharge date: 09/18/2018, was ready to discharge on 10/21 but delayed due to PASARR  Time spent: 57mins, more than 50% time spent on coordination of care.  Recommendations for Outpatient Follow-up:  1. F/u with SNF MD for hospital discharge follow up, repeat cbc/bmp at follow up 2. SNF to do voiding trial in two days, refer to urology if patient fail voiding trial. 3. Continue night time o2 supplement, she reports not able to tolerate cpap at night due to claustrophobic 4. F/u with cardiology Dr Wynonia Lawman 5. F/u with ED cardiology Dr Lovena Le  Discharge Diagnoses:  Active Hospital Problems   Diagnosis Date Noted  . Sepsis ([place]) 09/11/2018  . Syncope 09/11/2018  . Hypokalemia 09/11/2018  . Hypothyroidism   . Chronic systolic heart failure ([place])   . Sleep apnea 06/18/2017  . LBBB (left bundle branch block) 06/18/2017  . CKD (chronic kidney disease), stage III ([place])   . Persistent atrial fibrillation   . AKI (acute kidney injury) ([place]) 11/27/2014    Resolved Hospital Problems  No resolved problems to display.    Discharge Condition: stable  Diet recommendation: heart healthy diet, 1200cc fluids restriction daily  Filed Weights   09/16/18 0151 09/17/18 0443 09/18/18 0417  Weight: 86.3 kg 84.1 kg 83.4 kg    History of present illness: (per admitting MD Dr Loleta Books) PCP: Fanny Bien, MD  Patient coming from: Home  Chief Complaint: Syncope   HPI: [name] is a 84 y.o. female with a past medical history significant for CAD, sCHF EF 45%, chronic atrial fibrillation on Eliquis, CKD stage III, hypothyroidism who presents with syncope.  The patient was in her usual state of health until the day of admission, she was in the bathroom micturating, and then next thing she remembers she was on the floor, had hit her head.  She couldn't get up, but  crawled to the phone and called her granddaughter, who called 9-1-1.  She had no preceding nausea, weakness, warmth, dizziness, palpitations, chest pain, tachycardia.    She had had no preceding cough, fever, chills, malaise, vomiting, dysuria, urinary irritative symptoms.  ED course: - Afebrile, heart rate 101, respirations 27, blood pressure 96/79, pulse ox 94% on room air -Na 139, K 3.3, Cr 1.98 (baseline 1.1-1.3), WBC 30K, Hgb 15 - Troponin negative - CK normal -Urinalysis showed large bacteria, white blood cells -Chest x-ray clear -CT head and C-spine unremarkable, radiograph of the right knee unremarkable -Blood and urine cultures were obtained, and she was started on empiric ciprofloxacin, given IV fluids and TRH were asked to evaluate for syncope    Hospital Course:  Principal Problem:   Sepsis ([place]) Active Problems:   AKI (acute kidney injury) ([place])   Persistent atrial fibrillation   CKD (chronic kidney disease), stage III (H[facility])   Sleep apnea   LBBB (left bundle branch block)   Chronic systolic heart failure (H[facility])   Hypothyroidism   Syncope   Hypokalemia   Syncope with scalp hematoma: -per ED intake" syncopal episode that occurred when she stood from toilet; pt states she was unconscious for unnkown period of time and family found her and got her out of the floor; EMS reports pt was very pale on scene with large hematoma to L head and R knee abrasion/bruise; pt is on ELIQUIS;" -Ct head/CT cervical spine no acute findings,  -EKG paced rhythm -  pacemaker  interrogated on 10/18, function normal , pacing /sensing appropriately , no episodes correlate with syncope episode  -echo cardiogram lvef wnl, no WMA, + grade 2 diastolic dysfunction -Troponin 0.03. 0.03, she denies chest pain -she is found to have uti/aki which are likely the causes of syncope -she has improved , she is to discharge to snf  UTI with sepsis presented on admission -wbc 38, AKI, elevated lactic  acid of 3.1 on presentation -urine culture pan sensitive ecoli,  -she received meropenem since admission, change abx to bactrim ( h/o pcn allergy, never received cephalosporin), total 7 days abx, last day on 10/21 -she finished abx treatment on the hospital, wbc normalized at discharge.   AKI on CKDIII -cr 1.13 in 01/2018 -cr 1.98 on presentation -diuretic held on presentation, due to significant weight gain , she is given iv lasix 80mg  daily since  10/16  --renal US no obstruction,  --Renal dosing meds, snf md to repeat bmp and follow renal function.   patient reports lower abdominal discomfort on 10/20, PVR 153 cc, foley inserted  On 10/20 with relived of synptom  she is discharged on foley , she is started on flomax for 7 days, treat constipation, mobilize.  Voiding trial in two days in SNF,   Acute on chronic diastolic chf -Weight gain of 20pounds -she is given iv lasix 80mg  daily since 10/16, then lasix 80mg  bid on 10/20  -improved, lower extremity edema resolved, weight down, she is discharged on home dose lasix and spironolactone.  Hypokalemia: Replace k, normalized  Repeat bmp by SNF MD    Chronic atrial fibrillation with h/o bradycardia s/p pacemaker placement: Paced rhythm in the hospital CHA2DS2-Vasc5, on Eliquis. -Continueapixaban -Continueamiodarone -Continuemetoprolol  F/u with Dr Wynonia Lawman, and Dr Lovena Le   Hypothyroidism: -Continuelevothyroxine  Constipation: stool softener  H/o OSA by sleep study two yrs ago. She reports could not tolerate c pap due to claustrophobia She states she feels better with nightly o2   FTT SNF  Code Status:  Full, confirmed with patient  Family Communication: patient   Disposition Plan: snf    Consultants:  Cardiology for pacemaker interrogation   Procedures:  none  Antibiotics:  Meropenem from admission to 10/17,   Bactrim from 10/17 to 10/21   Discharge Exam: BP (!) 137/43   Pulse  75   Temp 97.9 F (36.6 C) (Oral)   Resp 18   Ht 5\' 4"  (1.626 m)   Wt 83.4 kg   SpO2 100%   BMI 31.56 kg/m     General:  Frail, hard of hearing, NAD  Cardiovascular: paced rhythm  Respiratory: diminished at basis, no wheezing  Abdomen: Soft/ND/NT, positive BS  Musculoskeletal:  bilateral lower extremity pitting Edema has resolved  Neuro: alert, oriented    Discharge Instructions You were cared for by a hospitalist during your hospital stay. If you have any questions about your discharge medications or the care you received while you were in the hospital after you are discharged, you can call the unit and asked to speak with the hospitalist on call if the hospitalist that took care of you is not available. Once you are discharged, your primary care physician will handle any further medical issues. Please note that NO REFILLS for any discharge medications will be authorized once you are discharged, as it is imperative that you return to your primary care physician (or establish a relationship with a primary care physician if you do not have one) for your aftercare needs so that they  can reassess your need for medications and monitor your lab values.  Discharge Instructions    AMB Referral to [facility]   Complete by:  As directed    Please assign patient to social worker for post hospital follow up at skilled facility currently for [facility]. Questions please call:   Natividad Brood, RN BSN [facility] Liaison  [phone] business mobile phone Toll free office [phone]   Reason for consult:  Patient is for ST- SNF, lives alone, transition back home needs   Diagnoses of:  Other   Other Diagnosis:  Sepsis   Expected date of contact:  1-3 days (reserved for hospital discharges)   Diet - low sodium heart healthy   Complete by:  As directed    Fluids restriction , 1200cc per day   Increase activity slowly   Complete by:  As directed       Allergies as of 09/18/2018      Reactions   Ampicillin Shortness Of Breath, Swelling, Rash   Hydromorphone Hcl Shortness Of Breath, Itching, Swelling, Rash   Penicillins Anaphylaxis, Other (See Comments)   Has patient had a PCN reaction causing immediate rash, facial/tongue/throat swelling, SOB or lightheadedness with hypotension: Yes Has patient had a PCN reaction causing severe rash involving mucus membranes or skin necrosis: Unknown Has patient had a PCN reaction that required hospitalization: Yes (was in the hosp) Has patient had a PCN reaction occurring within the last 10 years: No If all of the above answers are "NO", then may proceed with Cephalosporin use.      Medication List    TAKE these medications   acetaminophen 325 MG tablet Commonly known as:  TYLENOL Take 2 tablets (650 mg total) by mouth every 6 (six) hours as needed for mild pain (or Fever >/= 101).   amiodarone 200 MG tablet Commonly known as:  PACERONE Take 1 tablet (200 mg total) by mouth daily.   apixaban 2.5 MG Tabs tablet Commonly known as:  ELIQUIS Take 1 tablet (2.5 mg total) by mouth 2 (two) times daily.   atorvastatin 80 MG tablet Commonly known as:  LIPITOR Take 1 tablet (80 mg total) by mouth daily.   butalbital-acetaminophen-caffeine 50-325-40 MG tablet Commonly known as:  FIORICET, ESGIC Take 1 tablet by mouth every 4 (four) hours as needed for headache or migraine.   furosemide 40 MG tablet Commonly known as:  LASIX TAKE 2 TABLETS BY MOUTH DAILY   GENTEAL TEARS NIGHT-TIME Oint Apply 1 application to eye as needed (eye discomfort).   levothyroxine 200 MCG tablet Commonly known as:  SYNTHROID, LEVOTHROID Take 1 tablet (200 mcg total) by mouth daily before breakfast.   metoprolol succinate 50 MG 24 hr tablet Commonly known as:  TOPROL-XL Take 1 tablet (50 mg total) by mouth daily. Take with or immediately following a meal.   omeprazole 20 MG tablet Commonly known as:  PRILOSEC  OTC Take 20 mg by mouth daily as needed (for reflux).   oxymetazoline 0.05 % nasal spray Commonly known as:  AFRIN Place 1 spray into both nostrils as needed for congestion.   polyethylene glycol packet Commonly known as:  MIRALAX / GLYCOLAX Take 17 g by mouth daily. What changed:    when to take this  reasons to take this   senna-docusate 8.6-50 MG tablet Commonly known as:  Senokot-S Take 1 tablet by mouth at bedtime. What changed:    when to take this  reasons to take this  spironolactone 25 MG tablet Commonly known as:  ALDACTONE Take 1 tablet (25 mg total) by mouth daily.   tamsulosin 0.4 MG Caps capsule Commonly known as:  FLOMAX Take 2 capsules (0.8 mg total) by mouth daily for 7 days.      Allergies  Allergen Reactions  . Ampicillin Shortness Of Breath, Swelling and Rash  . Hydromorphone Hcl Shortness Of Breath, Itching, Swelling and Rash  . Penicillins Anaphylaxis and Other (See Comments)    Has patient had a PCN reaction causing immediate rash, facial/tongue/throat swelling, SOB or lightheadedness with hypotension: Yes Has patient had a PCN reaction causing severe rash involving mucus membranes or skin necrosis: Unknown Has patient had a PCN reaction that required hospitalization: Yes (was in the hosp) Has patient had a PCN reaction occurring within the last 10 years: No If all of the above answers are "NO", then may proceed with Cephalosporin use.     Contact information for follow-up providers    Jacolyn Reedy, MD Follow up.   Specialty:  Cardiology Contact information: [address] [place] [address] [phone]        Fanny Bien, MD Follow up.   Specialty:  Family Medicine Contact information: [address] [place] [address] [phone]            Contact information for after-discharge care    Destination    [facility] Preferred SNF .   Service:  Skilled  Nursing Contact information: [place] [phone]                   The results of significant diagnostics from this hospitalization (including imaging, microbiology, ancillary and laboratory) are listed below for reference.    Significant Diagnostic Studies: Dg Chest 2 View  Result Date: 09/11/2018 CLINICAL DATA:  Increased WBC EXAM: CHEST - 2 VIEW COMPARISON:  01/30/2018 FINDINGS: Left-sided multi lead pacing device as before. Chronic elevation of right diaphragm with subsegmental atelectasis. No focal airspace disease or pleural effusion. Stable cardiomediastinal silhouette with aortic atherosclerosis. No pneumothorax. IMPRESSION: No active cardiopulmonary disease. Chronic elevation of the right diaphragm with subsegmental atelectasis. Electronically Signed   By: Donavan Foil M.D.   On: 09/11/2018 02:35   Ct Head Wo Contrast  Result Date: 09/11/2018 CLINICAL DATA:  Syncopal episode hematoma to the head EXAM: CT HEAD WITHOUT CONTRAST CT CERVICAL SPINE WITHOUT CONTRAST TECHNIQUE: Multidetector CT imaging of the head and cervical spine was performed following the standard protocol without intravenous contrast. Multiplanar CT image reconstructions of the cervical spine were also generated. COMPARISON:  CT brain 11/24/2017, MRI 01/05/2010 FINDINGS: CT HEAD FINDINGS Brain: No acute territorial infarction, hemorrhage or intracranial mass. Moderate atrophy. Mild small vessel ischemic changes of the white matter. Chronic lacunar infarct in the left basal ganglia. Stable ventricle size. Vascular: No hyperdense vessels. Vertebral artery and carotid vascular calcification Skull: Normal. Negative for fracture or focal lesion. Sinuses/Orbits: No acute finding. Other: Moderate left frontal scalp hematoma CT CERVICAL SPINE FINDINGS Alignment: No subluxation.  Facet alignment is within normal limits. Skull base and vertebrae: No acute fracture. No  primary bone lesion or focal pathologic process. Soft tissues and spinal canal: No prevertebral fluid or swelling. No visible canal hematoma. Disc levels: Moderate degenerative changes C3-C4, C5-C6. Multiple level bilateral left greater than right hypertrophic facet degenerative change. Upper chest: Negative. Other: None IMPRESSION: 1. No CT evidence for acute intracranial abnormality. Atrophy and small  vessel ischemic changes of the white matter 2. Degenerative changes of the cervical spine. No acute osseous abnormality Electronically Signed   By: Donavan Foil M.D.   On: 09/11/2018 02:34   Ct Cervical Spine Wo Contrast  Result Date: 09/11/2018 CLINICAL DATA:  Syncopal episode hematoma to the head EXAM: CT HEAD WITHOUT CONTRAST CT CERVICAL SPINE WITHOUT CONTRAST TECHNIQUE: Multidetector CT imaging of the head and cervical spine was performed following the standard protocol without intravenous contrast. Multiplanar CT image reconstructions of the cervical spine were also generated. COMPARISON:  CT brain 11/24/2017, MRI 01/05/2010 FINDINGS: CT HEAD FINDINGS Brain: No acute territorial infarction, hemorrhage or intracranial mass. Moderate atrophy. Mild small vessel ischemic changes of the white matter. Chronic lacunar infarct in the left basal ganglia. Stable ventricle size. Vascular: No hyperdense vessels. Vertebral artery and carotid vascular calcification Skull: Normal. Negative for fracture or focal lesion. Sinuses/Orbits: No acute finding. Other: Moderate left frontal scalp hematoma CT CERVICAL SPINE FINDINGS Alignment: No subluxation.  Facet alignment is within normal limits. Skull base and vertebrae: No acute fracture. No primary bone lesion or focal pathologic process. Soft tissues and spinal canal: No prevertebral fluid or swelling. No visible canal hematoma. Disc levels: Moderate degenerative changes C3-C4, C5-C6. Multiple level bilateral left greater than right hypertrophic facet degenerative change.  Upper chest: Negative. Other: None IMPRESSION: 1. No CT evidence for acute intracranial abnormality. Atrophy and small vessel ischemic changes of the white matter 2. Degenerative changes of the cervical spine. No acute osseous abnormality Electronically Signed   By: Donavan Foil M.D.   On: 09/11/2018 02:34   US Renal  Result Date: 09/13/2018 CLINICAL DATA:  Elevated creatinine EXAM: RENAL / URINARY TRACT ULTRASOUND COMPLETE COMPARISON:  06/19/2017 FINDINGS: Right Kidney: Length: 10.1 cm. Lobulated contour is again identified. No hydronephrosis or mass lesion is seen. Left Kidney: Length: 10.9 cm. Lobular contour is again identified. No mass lesion or hydronephrosis is seen. Bladder: Appears normal for degree of bladder distention. IMPRESSION: Stable appearing kidneys when compared with the prior study. No obstructive changes are noted. Electronically Signed   By: Inez Catalina M.D.   On: 09/13/2018 10:50   Dg Knee Complete 4 Views Right  Result Date: 09/11/2018 CLINICAL DATA:  Syncopal episode. Hematoma to the right knee. EXAM: RIGHT KNEE - COMPLETE 4+ VIEW COMPARISON:  None. FINDINGS: Right knee appears intact. No evidence of acute fracture or subluxation. No focal bone lesion or bone destruction. Bone cortex and trabecular architecture appear intact. No radiopaque soft tissue foreign bodies. Infiltration and swelling of the subcutaneous fat anterior to the infrapatellar region suggesting hematoma. IMPRESSION: Soft tissue hematoma. No acute bony abnormalities. Electronically Signed   By: Lucienne Capers M.D.   On: 09/11/2018 02:21    Microbiology: Recent Results (from the past 240 hour(s))  Urine culture     Status: Abnormal   Collection Time: 09/11/18  2:58 AM  Result Value Ref Range Status   Specimen Description URINE, RANDOM  Final   Special Requests   Final    NONE Performed at [facility], [address] [place], [place] [address]    Culture >=100,000 COLONIES/mL ESCHERICHIA  COLI (A)  Final   Report Status 09/13/2018 FINAL  Final   Organism ID, Bacteria ESCHERICHIA COLI (A)  Final      Susceptibility   Escherichia coli - MIC*    AMPICILLIN <=2 SENSITIVE Sensitive     CEFAZOLIN <=4 SENSITIVE Sensitive     CEFTRIAXONE <=1 SENSITIVE Sensitive  CIPROFLOXACIN <=0.25 SENSITIVE Sensitive     GENTAMICIN <=1 SENSITIVE Sensitive     IMIPENEM <=0.25 SENSITIVE Sensitive     NITROFURANTOIN <=16 SENSITIVE Sensitive     TRIMETH/SULFA <=20 SENSITIVE Sensitive     AMPICILLIN/SULBACTAM <=2 SENSITIVE Sensitive     PIP/TAZO <=4 SENSITIVE Sensitive     Extended ESBL NEGATIVE Sensitive     * >=100,000 COLONIES/mL ESCHERICHIA COLI  Culture, blood (routine x 2)     Status: None   Collection Time: 09/11/18  5:30 AM  Result Value Ref Range Status   Specimen Description BLOOD RIGHT ANTECUBITAL  Final   Special Requests   Final    BOTTLES DRAWN AEROBIC ONLY Blood Culture adequate volume   Culture   Final    NO GROWTH 5 DAYS Performed at [facility], [place] [address], [place], [place] [address]    Report Status 09/16/2018 FINAL  Final  Culture, blood (routine x 2)     Status: None   Collection Time: 09/11/18  5:30 AM  Result Value Ref Range Status   Specimen Description BLOOD RIGHT ANTECUBITAL  Final   Special Requests   Final    BOTTLES DRAWN AEROBIC ONLY Blood Culture adequate volume   Culture   Final    NO GROWTH 5 DAYS Performed at [facility], [place] [address] [place], [place] [address]    Report Status 09/16/2018 FINAL  Final     Labs: Basic Metabolic Panel: Recent Labs  Lab 09/11/18 1447  09/13/18 0456 09/14/18 0430 09/15/18 0607 09/16/18 0619 09/17/18 0551  NA  --    < > 133* 135 132* 133* 131*  K  --    < > 3.9 3.6 3.3* 3.9 3.6  CL  --    < > 98 98 95* 96* 93*  CO2  --    < > 27 26 26 27 29   GLUCOSE  --    < > 109* 116* 108* 104* 116*  BUN  --    < > 24* 25* 21 22 21   CREATININE  --    < > 1.78* 1.87* 1.71* 1.83* 1.85*  CALCIUM  --     < > 8.4* 8.8* 8.9 8.8* 8.6*  MG 1.7  --   --  2.0  --   --   --    < > = values in this interval not displayed.   Liver Function Tests: Recent Labs  Lab 09/15/18 0607  AST 23  ALT 17  ALKPHOS 90  BILITOT 0.4  PROT 5.5*  ALBUMIN 2.7*   No results for input(s): LIPASE, AMYLASE in the last 168 hours. No results for input(s): AMMONIA in the last 168 hours. CBC: Recent Labs  Lab 09/13/18 0456 09/14/18 0430 09/15/18 0607 09/16/18 0619 09/17/18 0551  WBC 12.5* 11.0* 10.9* 12.1* 10.4  NEUTROABS 8.3* 6.9 6.5 7.9* 6.6  HGB 10.1* 10.6* 10.8* 10.6* 10.6*  HCT 31.2* 32.9* 31.8* 32.6* 32.2*  MCV 95.4 93.7 91.9 92.4 93.1  PLT 205 222 224 237 229   Cardiac Enzymes: Recent Labs  Lab 09/11/18 1447  TROPONINI 0.03*   BNP: BNP (last 3 results) Recent Labs    11/24/17 1155  BNP 550.6*    ProBNP (last 3 results) No results for input(s): PROBNP in the last 8760 hours.  CBG: No results for input(s): GLUCAP in the last 168 hours.     Signed:  Florencia Reasons MD, [name]  [facility] 09/18/2018, 9:53 AM

## 2018-09-17 NOTE — Progress Notes (Signed)
Pt ref CPAP for the night  

## 2018-09-17 NOTE — Plan of Care (Signed)

## 2018-09-17 NOTE — Discharge Instructions (Signed)

## 2018-09-17 NOTE — Progress Notes (Signed)
Pt. Refused cpap. 

## 2018-09-18 ENCOUNTER — Ambulatory Visit: Payer: Self-pay | Admitting: *Deleted

## 2018-09-18 DIAGNOSIS — R609 Edema, unspecified: Secondary | ICD-10-CM | POA: Diagnosis not present

## 2018-09-18 DIAGNOSIS — I10 Essential (primary) hypertension: Secondary | ICD-10-CM | POA: Diagnosis not present

## 2018-09-18 DIAGNOSIS — K5900 Constipation, unspecified: Secondary | ICD-10-CM | POA: Diagnosis not present

## 2018-09-18 DIAGNOSIS — R55 Syncope and collapse: Secondary | ICD-10-CM | POA: Diagnosis not present

## 2018-09-18 DIAGNOSIS — R2681 Unsteadiness on feet: Secondary | ICD-10-CM | POA: Diagnosis not present

## 2018-09-18 DIAGNOSIS — Z466 Encounter for fitting and adjustment of urinary device: Secondary | ICD-10-CM | POA: Diagnosis not present

## 2018-09-18 DIAGNOSIS — M2550 Pain in unspecified joint: Secondary | ICD-10-CM | POA: Diagnosis not present

## 2018-09-18 DIAGNOSIS — N183 Chronic kidney disease, stage 3 (moderate): Secondary | ICD-10-CM | POA: Diagnosis not present

## 2018-09-18 DIAGNOSIS — R339 Retention of urine, unspecified: Secondary | ICD-10-CM | POA: Diagnosis not present

## 2018-09-18 DIAGNOSIS — G4733 Obstructive sleep apnea (adult) (pediatric): Secondary | ICD-10-CM | POA: Diagnosis not present

## 2018-09-18 DIAGNOSIS — I5022 Chronic systolic (congestive) heart failure: Secondary | ICD-10-CM | POA: Diagnosis not present

## 2018-09-18 DIAGNOSIS — I4819 Other persistent atrial fibrillation: Secondary | ICD-10-CM | POA: Diagnosis not present

## 2018-09-18 DIAGNOSIS — I4891 Unspecified atrial fibrillation: Secondary | ICD-10-CM | POA: Diagnosis not present

## 2018-09-18 DIAGNOSIS — E785 Hyperlipidemia, unspecified: Secondary | ICD-10-CM | POA: Diagnosis not present

## 2018-09-18 DIAGNOSIS — E039 Hypothyroidism, unspecified: Secondary | ICD-10-CM | POA: Diagnosis not present

## 2018-09-18 DIAGNOSIS — H04123 Dry eye syndrome of bilateral lacrimal glands: Secondary | ICD-10-CM | POA: Diagnosis not present

## 2018-09-18 DIAGNOSIS — I2510 Atherosclerotic heart disease of native coronary artery without angina pectoris: Secondary | ICD-10-CM | POA: Diagnosis not present

## 2018-09-18 DIAGNOSIS — N179 Acute kidney failure, unspecified: Secondary | ICD-10-CM | POA: Diagnosis not present

## 2018-09-18 DIAGNOSIS — S80211D Abrasion, right knee, subsequent encounter: Secondary | ICD-10-CM | POA: Diagnosis not present

## 2018-09-18 DIAGNOSIS — I447 Left bundle-branch block, unspecified: Secondary | ICD-10-CM | POA: Diagnosis not present

## 2018-09-18 DIAGNOSIS — I509 Heart failure, unspecified: Secondary | ICD-10-CM | POA: Diagnosis not present

## 2018-09-18 DIAGNOSIS — G4730 Sleep apnea, unspecified: Secondary | ICD-10-CM | POA: Diagnosis not present

## 2018-09-18 DIAGNOSIS — R51 Headache: Secondary | ICD-10-CM | POA: Diagnosis not present

## 2018-09-18 DIAGNOSIS — Z7401 Bed confinement status: Secondary | ICD-10-CM | POA: Diagnosis not present

## 2018-09-18 DIAGNOSIS — R278 Other lack of coordination: Secondary | ICD-10-CM | POA: Diagnosis not present

## 2018-09-18 DIAGNOSIS — A419 Sepsis, unspecified organism: Secondary | ICD-10-CM | POA: Diagnosis not present

## 2018-09-18 DIAGNOSIS — M6281 Muscle weakness (generalized): Secondary | ICD-10-CM | POA: Diagnosis not present

## 2018-09-18 DIAGNOSIS — E876 Hypokalemia: Secondary | ICD-10-CM | POA: Diagnosis not present

## 2018-09-18 DIAGNOSIS — S0083XD Contusion of other part of head, subsequent encounter: Secondary | ICD-10-CM | POA: Diagnosis not present

## 2018-09-18 DIAGNOSIS — K219 Gastro-esophageal reflux disease without esophagitis: Secondary | ICD-10-CM | POA: Diagnosis not present

## 2018-09-18 MED ORDER — BUTALBITAL-APAP-CAFFEINE 50-325-40 MG PO TABS: 1.0000 | ORAL_TABLET | ORAL | 0 refills | Status: AC | PRN

## 2018-09-18 NOTE — Clinical Social Work Note (Signed)
PASARR obtained: [id number]. Expires 11/20. SNF needs discharge date on summary updated to today. Paged MD to notify.  Dayton Scrape, [place]

## 2018-09-18 NOTE — Progress Notes (Signed)
Patient discharged to SNF via stretcher and EMS transport. Paperwork and script given to transporters. All belongings sent with patient.

## 2018-09-18 NOTE — Care Management Important Message (Signed)
Important Message  Patient Details  Name: [name] MRN: [id number] Date of Birth: 1932/11/21   Medicare Important Message Given:  Yes    Jerzie Bieri P [place] 09/18/2018, 1:08 PM

## 2018-09-18 NOTE — Clinical Social Work Placement (Signed)
   CLINICAL SOCIAL WORK PLACEMENT  NOTE  Date:  09/18/2018  Patient Details  Name: [name] MRN: [id number] Date of Birth: 06-Feb-1932  Clinical Social Work is seeking post-discharge placement for this patient at the [place] level of care (*CSW will initial, date and re-position this form in  chart as items are completed):      Patient/family provided with [facility] Work Department's list of facilities offering this level of care within the geographic area requested by the patient (or if unable, by the patient's family).      Patient/family informed of their freedom to choose among providers that offer the needed level of care, that participate in Medicare, Medicaid or managed care program needed by the patient, have an available bed and are willing to accept the patient.      Patient/family informed of [facility]'s ownership interest in [facility] and [facility], as well as of the fact that they are under no obligation to receive care at these facilities.  PASRR submitted to EDS on 09/13/18     PASRR number received on 09/18/18     Existing PASRR number confirmed on       FL2 transmitted to all facilities in geographic area requested by pt/family on 09/13/18     FL2 transmitted to all facilities within larger geographic area on       Patient informed that his/her managed care company has contracts with or will negotiate with certain facilities, including the following:        Yes   Patient/family informed of bed offers received.  Patient chooses bed at [facility] [place]     Physician recommends and patient chooses bed at      Patient to be transferred to [facility] on 09/18/18.  Patient to be transferred to facility by [facility]     Patient family notified on 09/18/18 of transfer.  Name of family member notified:  Efraim Kaufmann     PHYSICIAN Please prepare prescriptions     Additional Comment:     _______________________________________________ Candie Chroman, LCSW 09/18/2018, 10:41 AM

## 2018-09-18 NOTE — Progress Notes (Signed)
Report called to Caryl Pina, nurse, at [facility].

## 2018-09-18 NOTE — Clinical Social Work Note (Signed)
CSW facilitated patient discharge including contacting patient family and facility to confirm patient discharge plans. Clinical information faxed to facility and family agreeable with plan. CSW arranged ambulance transport via [facility] to [facility] at 1:30 pm. RN to call report prior to discharge [phone] Room 208).  CSW will sign off for now as social work intervention is no longer needed. Please consult [place] again if new needs arise.  [place] Scrape, [place]

## 2018-09-20 ENCOUNTER — Other Ambulatory Visit: Payer: Self-pay | Admitting: *Deleted

## 2018-09-20 DIAGNOSIS — A419 Sepsis, unspecified organism: Secondary | ICD-10-CM | POA: Diagnosis not present

## 2018-09-20 DIAGNOSIS — I509 Heart failure, unspecified: Secondary | ICD-10-CM | POA: Diagnosis not present

## 2018-09-20 DIAGNOSIS — R55 Syncope and collapse: Secondary | ICD-10-CM | POA: Diagnosis not present

## 2018-09-20 DIAGNOSIS — G4730 Sleep apnea, unspecified: Secondary | ICD-10-CM | POA: Diagnosis not present

## 2018-09-20 DIAGNOSIS — I4891 Unspecified atrial fibrillation: Secondary | ICD-10-CM | POA: Diagnosis not present

## 2018-09-20 DIAGNOSIS — N183 Chronic kidney disease, stage 3 (moderate): Secondary | ICD-10-CM | POA: Diagnosis not present

## 2018-09-20 DIAGNOSIS — E039 Hypothyroidism, unspecified: Secondary | ICD-10-CM | POA: Diagnosis not present

## 2018-09-20 NOTE — Patient Outreach (Signed)
[place] [facility] Care Management  09/20/2018  MINNAH LLAMAS 29-Dec-1931 [id number]   Attended interdisciplinary team meeting at [facility] in [place] with [place] team members Jari Pigg and [place].  Patient admitted to [facility] 09/18/18 after hospital stay for sepsis and syncope.  PT stating patient was walking short distances with walker and min assist with ADL's  Nursing stated patient's metoprolol cut in half related to low b/p's on admission.  Patient has 5 stairs to climb to get into her home.  Patient's discharge plan has not been established related to recent admission.   Made aware by [facility] hospital liaison patient agreed to [place] [facility] services during inpatient stay at [facility].   Plan to visit with patient at next facility visit.   Rutherford Limerick RN, BSN [place] Acute Care Coordinator [phone]) Business Mobile [phone]) Toll free office

## 2018-09-27 ENCOUNTER — Other Ambulatory Visit: Payer: Self-pay | Admitting: *Deleted

## 2018-09-28 DIAGNOSIS — I4891 Unspecified atrial fibrillation: Secondary | ICD-10-CM | POA: Diagnosis not present

## 2018-09-28 DIAGNOSIS — R55 Syncope and collapse: Secondary | ICD-10-CM | POA: Diagnosis not present

## 2018-09-28 DIAGNOSIS — G4730 Sleep apnea, unspecified: Secondary | ICD-10-CM | POA: Diagnosis not present

## 2018-09-28 DIAGNOSIS — A419 Sepsis, unspecified organism: Secondary | ICD-10-CM | POA: Diagnosis not present

## 2018-09-28 DIAGNOSIS — E039 Hypothyroidism, unspecified: Secondary | ICD-10-CM | POA: Diagnosis not present

## 2018-09-28 DIAGNOSIS — N183 Chronic kidney disease, stage 3 (moderate): Secondary | ICD-10-CM | POA: Diagnosis not present

## 2018-09-28 DIAGNOSIS — I509 Heart failure, unspecified: Secondary | ICD-10-CM | POA: Diagnosis not present

## 2018-09-28 NOTE — Addendum Note (Signed)
Addended by: Gurney Maxin on: 09/28/2018 02:43 PM   Modules accepted: Orders

## 2018-09-28 NOTE — Patient Outreach (Signed)
[facility] [place]) Care Management  09/28/2018  [name] 04-07-1932 [id number]   Attended interdisciplinary team meeting at [facility] in [place] with [place] team members Jari Pigg and [place].  Patient was admitted to SNF on 09/18/18 after a hospital admit for a fall.  Patient was engaged by [facility] Liaison while admitted and agreed to [facility] services.  PTstates patient has improved and has not had any more orthostatic incidences.  Discharge planner patient will discharge Tuesday 09/01/18 back to her home where she resides alone.    Met with patient at the bedside.  Patient noted to be extremely HOH.  Patient remembers signing up for [place] Management services while admitted to the hospital.  Patient states her son and daughter in law are a big help to her and live nearby.  Patient states she is ready to go home.    Plan to let [facility] CM assigned to this patient intended discharge date.   Rutherford Limerick RN, BSN [place] Acute Care Coordinator [phone]) Business Mobile [phone]) Toll free office

## 2018-10-01 ENCOUNTER — Other Ambulatory Visit (HOSPITAL_COMMUNITY): Payer: Self-pay | Admitting: Internal Medicine

## 2018-10-02 DIAGNOSIS — N183 Chronic kidney disease, stage 3 (moderate): Secondary | ICD-10-CM | POA: Diagnosis not present

## 2018-10-02 DIAGNOSIS — S0083XD Contusion of other part of head, subsequent encounter: Secondary | ICD-10-CM | POA: Diagnosis not present

## 2018-10-02 DIAGNOSIS — E669 Obesity, unspecified: Secondary | ICD-10-CM | POA: Diagnosis not present

## 2018-10-02 DIAGNOSIS — Z950 Presence of cardiac pacemaker: Secondary | ICD-10-CM | POA: Diagnosis not present

## 2018-10-02 DIAGNOSIS — F419 Anxiety disorder, unspecified: Secondary | ICD-10-CM | POA: Diagnosis not present

## 2018-10-02 DIAGNOSIS — A4151 Sepsis due to Escherichia coli [E. coli]: Secondary | ICD-10-CM | POA: Diagnosis not present

## 2018-10-02 DIAGNOSIS — I130 Hypertensive heart and chronic kidney disease with heart failure and stage 1 through stage 4 chronic kidney disease, or unspecified chronic kidney disease: Secondary | ICD-10-CM | POA: Diagnosis not present

## 2018-10-02 DIAGNOSIS — G4733 Obstructive sleep apnea (adult) (pediatric): Secondary | ICD-10-CM | POA: Diagnosis not present

## 2018-10-02 DIAGNOSIS — I447 Left bundle-branch block, unspecified: Secondary | ICD-10-CM | POA: Diagnosis not present

## 2018-10-02 DIAGNOSIS — Z9181 History of falling: Secondary | ICD-10-CM | POA: Diagnosis not present

## 2018-10-02 DIAGNOSIS — E785 Hyperlipidemia, unspecified: Secondary | ICD-10-CM | POA: Diagnosis not present

## 2018-10-02 DIAGNOSIS — I2510 Atherosclerotic heart disease of native coronary artery without angina pectoris: Secondary | ICD-10-CM | POA: Diagnosis not present

## 2018-10-02 DIAGNOSIS — W19XXXD Unspecified fall, subsequent encounter: Secondary | ICD-10-CM | POA: Diagnosis not present

## 2018-10-02 DIAGNOSIS — M47812 Spondylosis without myelopathy or radiculopathy, cervical region: Secondary | ICD-10-CM | POA: Diagnosis not present

## 2018-10-02 DIAGNOSIS — I5022 Chronic systolic (congestive) heart failure: Secondary | ICD-10-CM | POA: Diagnosis not present

## 2018-10-02 DIAGNOSIS — S8001XD Contusion of right knee, subsequent encounter: Secondary | ICD-10-CM | POA: Diagnosis not present

## 2018-10-02 DIAGNOSIS — G4700 Insomnia, unspecified: Secondary | ICD-10-CM | POA: Diagnosis not present

## 2018-10-02 DIAGNOSIS — Z7901 Long term (current) use of anticoagulants: Secondary | ICD-10-CM | POA: Diagnosis not present

## 2018-10-02 DIAGNOSIS — E039 Hypothyroidism, unspecified: Secondary | ICD-10-CM | POA: Diagnosis not present

## 2018-10-02 DIAGNOSIS — I4819 Other persistent atrial fibrillation: Secondary | ICD-10-CM | POA: Diagnosis not present

## 2018-10-02 DIAGNOSIS — H9190 Unspecified hearing loss, unspecified ear: Secondary | ICD-10-CM | POA: Diagnosis not present

## 2018-10-02 DIAGNOSIS — E876 Hypokalemia: Secondary | ICD-10-CM | POA: Diagnosis not present

## 2018-10-02 DIAGNOSIS — I5033 Acute on chronic diastolic (congestive) heart failure: Secondary | ICD-10-CM | POA: Diagnosis not present

## 2018-10-02 DIAGNOSIS — N390 Urinary tract infection, site not specified: Secondary | ICD-10-CM | POA: Diagnosis not present

## 2018-10-02 DIAGNOSIS — K219 Gastro-esophageal reflux disease without esophagitis: Secondary | ICD-10-CM | POA: Diagnosis not present

## 2018-10-03 ENCOUNTER — Other Ambulatory Visit: Payer: Self-pay | Admitting: Cardiology

## 2018-10-03 ENCOUNTER — Encounter: Payer: Self-pay | Admitting: Cardiology

## 2018-10-03 ENCOUNTER — Ambulatory Visit (INDEPENDENT_AMBULATORY_CARE_PROVIDER_SITE_OTHER): Payer: Medicare Other | Admitting: Cardiology

## 2018-10-03 VITALS — BP 130/66 | HR 71 | Ht 64.5 in | Wt 188.8 lb

## 2018-10-03 DIAGNOSIS — R0600 Dyspnea, unspecified: Secondary | ICD-10-CM | POA: Diagnosis not present

## 2018-10-03 DIAGNOSIS — I119 Hypertensive heart disease without heart failure: Secondary | ICD-10-CM | POA: Diagnosis not present

## 2018-10-03 DIAGNOSIS — I5022 Chronic systolic (congestive) heart failure: Secondary | ICD-10-CM

## 2018-10-03 DIAGNOSIS — Z950 Presence of cardiac pacemaker: Secondary | ICD-10-CM | POA: Diagnosis not present

## 2018-10-03 DIAGNOSIS — I4819 Other persistent atrial fibrillation: Secondary | ICD-10-CM | POA: Diagnosis not present

## 2018-10-03 DIAGNOSIS — I519 Heart disease, unspecified: Secondary | ICD-10-CM | POA: Diagnosis not present

## 2018-10-03 DIAGNOSIS — E039 Hypothyroidism, unspecified: Secondary | ICD-10-CM | POA: Diagnosis not present

## 2018-10-03 DIAGNOSIS — I509 Heart failure, unspecified: Secondary | ICD-10-CM | POA: Diagnosis not present

## 2018-10-03 DIAGNOSIS — I2510 Atherosclerotic heart disease of native coronary artery without angina pectoris: Secondary | ICD-10-CM

## 2018-10-03 DIAGNOSIS — I10 Essential (primary) hypertension: Secondary | ICD-10-CM | POA: Diagnosis not present

## 2018-10-03 DIAGNOSIS — I498 Other specified cardiac arrhythmias: Secondary | ICD-10-CM | POA: Diagnosis not present

## 2018-10-03 DIAGNOSIS — I4891 Unspecified atrial fibrillation: Secondary | ICD-10-CM | POA: Diagnosis not present

## 2018-10-03 NOTE — Progress Notes (Signed)
Cardiology Office Note:    Date:  10/03/2018   ID:  Icess, Bertoni [birth year], MRN [id number]  PCP:  Fanny Bien, MD  Cardiologist:  Jenne Campus, MD    Referring MD: Fanny Bien, MD   Chief Complaint  Patient presents with  . Hospitalization Follow-up  I was recently in the hospital because of sepsis  History of Present Illness:    KAYRON KALMAR is a 84 y.o. female with complex past medical history which includes paroxysmal atrial fibrillation, chronic systolic congestive heart failure, left bundle branch block, status post by ventricle pacemaker implantation in March, long-term use of anticoagulation.  She comes today to office for follow-up recently she ended up having urine tract infection that was complicated by sepsis.  She spent 8 days in the hospital at that she was transferred to nursing home she has been home since Monday today's Wednesday.  She seems to be doing well still weak and tired but overall considering the sepsis that she had that was very severe she is recovered quite nicely.  She has multiple services at home including some health that comes physical therapy.  Denies have any cardiac complaints no shortness of breath chest pain tightness squeezing pressure burning chest.  She does not have pitting edema on her lower extremities interestingly her weight is up a few pounds and she said her weight being that way since she has been discharged from the hospital.  I understand that this is related to multiple fluids that she got when she was with sepsis.  Luckily does not have much shortness of breath physical exam does not show any evidence of decompensated congestive heart failure.  Past Medical History:  Diagnosis Date  . Anxiety    "some; not treated for it" (11/27/2014)  . Bradycardia   . CAD (coronary artery disease), native coronary artery    2003 normal left main, 80% mid LAD  2.5 x 13 mm Cypher stent to LAD Dr. Rex Kras, subsequent cath 2004  showed patent stent in LAD with no sig disease in RCA or Circ;    . CHF (congestive heart failure) ([place])   . CKD (chronic kidney disease), stage III ([place])   . GERD (gastroesophageal reflux disease)   . History of hiatal hernia   . Hyperlipidemia   . Hypertension   . LBBB (left bundle branch block)   . Migraine    "very rare now" (11/27/2014)  . Obesity (BMI 30-39.9) 06/18/2017  . On home oxygen therapy    "only at night; ? setting" (11/27/2014)  . Osteoarthritis cervical spine   . Sleep apnea 06/18/2017    Past Surgical History:  Procedure Laterality Date  . ABDOMINAL HYSTERECTOMY    . BIV PACEMAKER INSERTION CRT-P N/A 01/29/2018   Procedure: BIV PACEMAKER INSERTION CRT-P;  Surgeon: Evans Lance, MD;  Location: [facility];  Service: Cardiovascular;  Laterality: N/A;  . CARDIOVERSION N/A 12/05/2017   Procedure: CARDIOVERSION;  Surgeon: Jolaine Artist, MD;  Location: [facility] ENDOSCOPY;  Service: Cardiovascular;  Laterality: N/A;  . CATARACT EXTRACTION W/ INTRAOCULAR LENS  IMPLANT, BILATERAL Bilateral   . CYSTOCELE REPAIR      Current Medications: Current Meds  Medication Sig  . acetaminophen (TYLENOL) 325 MG tablet Take 2 tablets (650 mg total) by mouth every 6 (six) hours as needed for mild pain (or Fever >/= 101).  Marland Kitchen amiodarone (PACERONE) 200 MG tablet Take 1 tablet (200 mg total) by mouth daily.  Marland Kitchen apixaban (  ELIQUIS) 2.5 MG TABS tablet Take 1 tablet (2.5 mg total) by mouth 2 (two) times daily.  Marland Kitchen atorvastatin (LIPITOR) 80 MG tablet Take 1 tablet (80 mg total) by mouth daily.  . butalbital-acetaminophen-caffeine (FIORICET, ESGIC) 50-325-40 MG tablet Take 1 tablet by mouth every 4 (four) hours as needed for headache or migraine.  . furosemide (LASIX) 40 MG tablet TAKE 2 TABLETS BY MOUTH DAILY  . levothyroxine (SYNTHROID, LEVOTHROID) 200 MCG tablet Take 1 tablet (200 mcg total) by mouth daily before breakfast. (Patient taking differently: Take 175 mcg by mouth daily before  breakfast. )  . metoprolol succinate (TOPROL-XL) 50 MG 24 hr tablet Take 1 tablet (50 mg total) by mouth daily. Take with or immediately following a meal. (Patient taking differently: Take 25 mg by mouth daily. Take with or immediately following a meal.)  . spironolactone (ALDACTONE) 25 MG tablet Take 1 tablet (25 mg total) by mouth daily.  . tamsulosin (FLOMAX) 0.4 MG CAPS capsule Take 0.8 mg by mouth daily.     Allergies:   Ampicillin; Hydromorphone hcl; and Penicillins   Social History   Socioeconomic History  . Marital status: Divorced    Spouse name: Not on file  . Number of children: 3  . Years of education: college  . Highest education level: Not on file  Occupational History  . Occupation: Retired   Scientific laboratory technician  . Financial resource strain: Not on file  . Food insecurity:    Worry: Not on file    Inability: Not on file  . Transportation needs:    Medical: Not on file    Non-medical: Not on file  Tobacco Use  . Smoking status: Never Smoker  . Smokeless tobacco: Never Used  Substance and Sexual Activity  . Alcohol use: No    Alcohol/week: 0.0 standard drinks  . Drug use: No  . Sexual activity: Never  Lifestyle  . Physical activity:    Days per week: Not on file    Minutes per session: Not on file  . Stress: Not on file  Relationships  . Social connections:    Talks on phone: Not on file    Gets together: Not on file    Attends religious service: Not on file    Active member of club or organization: Not on file    Attends meetings of clubs or organizations: Not on file    Relationship status: Not on file  Other Topics Concern  . Not on file  Social History Narrative   Drinks tea, occasional coffee     Family History: The patient's family history includes Heart Problems in her mother; Heart attack in her father and mother; Hypertension in her father and mother. ROS:   Please see the history of present illness.    All 14 point review of systems negative  except as described per history of present illness  EKGs/Labs/Other Studies Reviewed:     Echocardiogram from 12 September 2018  Left ventricle: The cavity size was normal. There was mild   concentric hypertrophy. Systolic function was normal. The   estimated ejection fraction was in the range of 55% to 60%. Wall   motion was normal; there were no regional wall motion   abnormalities. Features are consistent with a pseudonormal left   ventricular filling pattern, with concomitant abnormal relaxation   and increased filling pressure (grade 2 diastolic dysfunction).   Doppler parameters are consistent with high ventricular filling   pressure. - Aortic valve: Transvalvular velocity was  within the normal range.   There was no stenosis. There was mild regurgitation. Valve area   (Vmax): 2.48 cm^2. - Mitral valve: Calcified annulus. Mildly thickened leaflets .   Transvalvular velocity was within the normal range. There was no   evidence for stenosis. There was mild regurgitation. Valve area   by pressure half-time: 2.06 cm^2. - Right ventricle: The cavity size was normal. Wall thickness was   normal. Systolic function was normal. - Tricuspid valve: There was moderate regurgitation. - Pulmonary arteries: Systolic pressure was severely increased. PA   peak pressure: 72 mm Hg (S). - Pericardium  Recent Labs: 11/24/2017: B Natriuretic Peptide 550.6 11/25/2017: TSH 108.169 09/14/2018: Magnesium 2.0 09/15/2018: ALT 17 09/17/2018: BUN 21; Creatinine, Ser 1.85; Hemoglobin 10.6; Platelets 229; Potassium 3.6; Sodium 131  Recent Lipid Panel No results found for: CHOL, TRIG, HDL, CHOLHDL, VLDL, LDLCALC, LDLDIRECT  Physical Exam:    VS:  BP 130/66   Pulse 71   Ht 5' 4.5" (1.638 m)   Wt 188 lb 12.8 oz (85.6 kg)   SpO2 98%   BMI 31.91 kg/m     Wt Readings from Last 3 Encounters:  10/03/18 188 lb 12.8 oz (85.6 kg)  09/18/18 183 lb 13.8 oz (83.4 kg)  02/06/18 166 lb (75.3 kg)     GEN:   Well nourished, well developed in no acute distress HEENT: Normal NECK: No JVD; No carotid bruits LYMPHATICS: No lymphadenopathy CARDIAC: RRR, no murmurs, no rubs, no gallops RESPIRATORY:  Clear to auscultation without rales, wheezing or rhonchi  ABDOMEN: Soft, non-tender, non-distended MUSCULOSKELETAL:  No edema; No deformity  SKIN: Warm and dry LOWER EXTREMITIES: no swelling NEUROLOGIC:  Alert and oriented x 3 PSYCHIATRIC:  Normal affect   ASSESSMENT:    1. Hypertensive heart disease without CHF   2. Dyspnea, unspecified type   3. Persistent atrial fibrillation   4. Coronary artery disease involving native coronary artery of native heart without angina pectoris   5. Chronic systolic heart failure (HCC)   6. Status post biventricular pacemaker    PLAN:    In order of problems listed above:  1. Congestive heart failure with previously diminished left ventricular ejection fraction and left bundle branch block.  In March she received biventricular pacemaker.  Her last echocardiogram I have is from just few days ago which showing normalization of her left ventricular ejection fraction. Dyspnea on exertion stable denies having any Paroxysmal atrial fibrillation.  She is anticoagulated which I will continue.  We will continue with amiodarone. Status post biventricular pacing.  Her device has been interrogated in the hospital and functioning normally.  We will make sure that she is doing well in our pacemaker clinic. Pulmonary hypertension 72 mmHg.  Slight increased her echocardiogram from 2018 December showed pulmonary pressure of 60.  Because of her comorbidity I think the best approach to this problem will be conservative.  Her weight is up I will check a proBNP as well as Chem-7 and based on that we will decide about therapy she may require intensification of her diuresis.  However likely clinically she looks good.    Medication Adjustments/Labs and Tests Ordered: Current medicines  are reviewed at length with the patient today.  Concerns regarding medicines are outlined above.  Orders Placed This Encounter  Procedures  . Basic metabolic panel  . CBC  . Pro b natriuretic peptide (BNP)   Medication changes: No orders of the defined types were placed in this encounter.   Signed, Herbie Baltimore  Synetta Fail, MD, [facility] 10/03/2018 12:16 PM    [place]

## 2018-10-03 NOTE — Patient Instructions (Signed)
Medication Instructions:  Your physician recommends that you continue on your current medications as directed. Please refer to the Current Medication list given to you today.  If you need a refill on your cardiac medications before your next appointment, please call your pharmacy.   Lab work: Your physician recommends that you return for lab work today: BMP, CBC, and BNP   If you have labs (blood work) drawn today and your tests are completely normal, you will receive your results only by: Marland Kitchen MyChart Message (if you have MyChart) OR . A paper copy in the mail If you have any lab test that is abnormal or we need to change your treatment, we will call you to review the results.  Testing/Procedures: None.   Follow-Up: At [facility], you and your health needs are our priority.  As part of our continuing mission to provide you with exceptional heart care, we have created designated Provider Care Teams.  These Care Teams include your primary Cardiologist (physician) and Advanced Practice Providers (APPs -  Physician Assistants and Nurse Practitioners) who all work together to provide you with the care you need, when you need it. You will need a follow up appointment in 2 months.  Please call our office 2 months in advance to schedule this appointment.  You may see No primary care provider on file. or another member of our [employer] Provider Team in [place]: Shirlee More, MD . Jyl Heinz, MD  Any Other Special Instructions Will Be Listed Below (If Applicable).

## 2018-10-04 DIAGNOSIS — I130 Hypertensive heart and chronic kidney disease with heart failure and stage 1 through stage 4 chronic kidney disease, or unspecified chronic kidney disease: Secondary | ICD-10-CM | POA: Diagnosis not present

## 2018-10-04 DIAGNOSIS — E876 Hypokalemia: Secondary | ICD-10-CM | POA: Diagnosis not present

## 2018-10-04 DIAGNOSIS — N390 Urinary tract infection, site not specified: Secondary | ICD-10-CM | POA: Diagnosis not present

## 2018-10-04 DIAGNOSIS — A4151 Sepsis due to Escherichia coli [E. coli]: Secondary | ICD-10-CM | POA: Diagnosis not present

## 2018-10-04 DIAGNOSIS — I5033 Acute on chronic diastolic (congestive) heart failure: Secondary | ICD-10-CM | POA: Diagnosis not present

## 2018-10-04 DIAGNOSIS — I5022 Chronic systolic (congestive) heart failure: Secondary | ICD-10-CM | POA: Diagnosis not present

## 2018-10-04 LAB — CBC
HEMATOCRIT: 38.6 % (ref 34.0–46.6)
HEMOGLOBIN: 13.1 g/dL (ref 11.1–15.9)
MCH: 31 pg (ref 26.6–33.0)
MCHC: 33.9 g/dL (ref 31.5–35.7)
MCV: 91 fL (ref 79–97)
Platelets: 335 10*3/uL (ref 150–450)
RBC: 4.23 x10E6/uL (ref 3.77–5.28)
RDW: 13.4 % (ref 12.3–15.4)
WBC: 11 10*3/uL — ABNORMAL HIGH (ref 3.4–10.8)

## 2018-10-04 LAB — BASIC METABOLIC PANEL
BUN/Creatinine Ratio: 12 (ref 12–28)
BUN: 20 mg/dL (ref 8–27)
CALCIUM: 9.5 mg/dL (ref 8.7–10.3)
CO2: 28 mmol/L (ref 20–29)
CREATININE: 1.71 mg/dL — AB (ref 0.57–1.00)
Chloride: 96 mmol/L (ref 96–106)
GFR calc Af Amer: 31 mL/min/{1.73_m2} — ABNORMAL LOW (ref >59)
GFR, EST NON AFRICAN AMERICAN: 27 mL/min/{1.73_m2} — AB (ref >59)
GLUCOSE: 73 mg/dL (ref 65–99)
POTASSIUM: 3.6 mmol/L (ref 3.5–5.2)
SODIUM: 143 mmol/L (ref 134–144)

## 2018-10-04 LAB — PRO B NATRIURETIC PEPTIDE: NT-PRO BNP: 286 pg/mL (ref 0–738)

## 2018-10-05 ENCOUNTER — Other Ambulatory Visit: Payer: Self-pay | Admitting: *Deleted

## 2018-10-05 DIAGNOSIS — I5033 Acute on chronic diastolic (congestive) heart failure: Secondary | ICD-10-CM | POA: Diagnosis not present

## 2018-10-05 DIAGNOSIS — E876 Hypokalemia: Secondary | ICD-10-CM | POA: Diagnosis not present

## 2018-10-05 DIAGNOSIS — I130 Hypertensive heart and chronic kidney disease with heart failure and stage 1 through stage 4 chronic kidney disease, or unspecified chronic kidney disease: Secondary | ICD-10-CM | POA: Diagnosis not present

## 2018-10-05 DIAGNOSIS — N390 Urinary tract infection, site not specified: Secondary | ICD-10-CM | POA: Diagnosis not present

## 2018-10-05 DIAGNOSIS — A4151 Sepsis due to Escherichia coli [E. coli]: Secondary | ICD-10-CM | POA: Diagnosis not present

## 2018-10-05 DIAGNOSIS — I5022 Chronic systolic (congestive) heart failure: Secondary | ICD-10-CM | POA: Diagnosis not present

## 2018-10-05 NOTE — Patient Outreach (Signed)
[facility]) Care Management  [place]  10/05/2018   [name] July 15, 1932 [id number]   Transition of care   Referral: from [place] Reason :Hospital admission 1015-10/22 for Sepsis, Syncope , Heart failure , AKI on CKD ,  DC to [facility] SN[facility] 10/22- Discharged to home 11/5.  Insurance : Medicare  Past medical history significant for CAD,sCHF EF 45%,chronic atrial fibrillation on Eliquis, CKD stage III, hypothyroidism.   Subjective:   Successful outreach call to patient, explained reason for the call , and [facility] services.  Patient discussed her recent hospital admission and rehab stay at [facility]. Patient states that she is doing well glad to be back at home. Patient discussed  signing up with [facility] services while in the hospital and states she was told she can change her mind if decided to.  Patient very pleasantly states that  she already has enough people coming and calling her home  , discussed home health with Alvis Lemmings, RN , PT/OT and that is working well. Patient states that RN has reviewed her medications, and therapy reviewed her home . Patient states that she has changed her mind and does not want to go with [facility] services at this time . Discussed with patient how [facility] services does not interfere with home health services we work together to support care needs after discharge  and how we can continue to provide telephone follow up if that is her request for  management of conditions, she still declines need at this time.  Patient agreeable to receiving my contact number along with [facility] care management toll free number provided.   Patient discussed that she is weighing herself daily , and knows when to call the doctor for weight gain of 3 pounds in a day and 5 in a week.  Patient reports taking her medications as prescribed, reports attending follow up this week with heart doctor. Patient denies having difficulty with affording medications or  organizing medications.   Plan Will mark case as not active, patient declines need for services at this time.  Will send successful outreach letter.    Joylene Draft, RN, [place] Management Coordinator  [phone]- Mobile [phone]- Toll Free Main Office        Encounter Medications:  Outpatient Encounter Medications as of 10/05/2018  Medication Sig  . acetaminophen (TYLENOL) 325 MG tablet Take 2 tablets (650 mg total) by mouth every 6 (six) hours as needed for mild pain (or Fever >/= 101).  Marland Kitchen amiodarone (PACERONE) 200 MG tablet Take 1 tablet (200 mg total) by mouth daily.  Marland Kitchen apixaban (ELIQUIS) 2.5 MG TABS tablet Take 1 tablet (2.5 mg total) by mouth 2 (two) times daily.  Marland Kitchen atorvastatin (LIPITOR) 80 MG tablet Take 1 tablet (80 mg total) by mouth daily.  . butalbital-acetaminophen-caffeine (FIORICET, ESGIC) 50-325-40 MG tablet Take 1 tablet by mouth every 4 (four) hours as needed for headache or migraine.  . furosemide (LASIX) 40 MG tablet TAKE 2 TABLETS BY MOUTH DAILY  . levothyroxine (SYNTHROID, LEVOTHROID) 200 MCG tablet Take 1 tablet (200 mcg total) by mouth daily before breakfast. (Patient taking differently: Take 175 mcg by mouth daily before breakfast. )  . metoprolol succinate (TOPROL-XL) 50 MG 24 hr tablet Take 1 tablet (50 mg total) by mouth daily. Take with or immediately following a meal. (Patient taking differently: Take 25 mg by mouth daily. Take with or immediately following a meal.)  . spironolactone (ALDACTONE) 25 MG tablet Take  1 tablet (25 mg total) by mouth daily.  . tamsulosin (FLOMAX) 0.4 MG CAPS capsule Take 0.8 mg by mouth daily.   No facility-administered encounter medications on file as of 10/05/2018.     Functional Status:  In your present state of health, do you have any difficulty performing the following activities: 09/11/2018 01/29/2018  Hearing? [place]? N -  Difficulty concentrating or making decisions? N -  Walking  or climbing stairs? N -  Dressing or bathing? N -  Doing errands, shopping? N N  Comment - -  Some recent data might be hidden    Fall/Depression Screening: Fall Risk  06/28/2018 07/22/2016  Falls in the past year? Yes No  Comment [place] Telephone Survey: data to providers prior to load [place] Telephone Survey: data to providers prior to load  Number falls in past yr: 1 -  Comment [place] Telephone Survey Actual Response = 1 -  Injury with Fall? Yes -   No flowsheet data found.  Assessment: Initial transition of care call     Plan:

## 2018-10-09 DIAGNOSIS — I5022 Chronic systolic (congestive) heart failure: Secondary | ICD-10-CM | POA: Diagnosis not present

## 2018-10-09 DIAGNOSIS — A4151 Sepsis due to Escherichia coli [E. coli]: Secondary | ICD-10-CM | POA: Diagnosis not present

## 2018-10-09 DIAGNOSIS — I5033 Acute on chronic diastolic (congestive) heart failure: Secondary | ICD-10-CM | POA: Diagnosis not present

## 2018-10-09 DIAGNOSIS — N390 Urinary tract infection, site not specified: Secondary | ICD-10-CM | POA: Diagnosis not present

## 2018-10-09 DIAGNOSIS — E876 Hypokalemia: Secondary | ICD-10-CM | POA: Diagnosis not present

## 2018-10-09 DIAGNOSIS — I130 Hypertensive heart and chronic kidney disease with heart failure and stage 1 through stage 4 chronic kidney disease, or unspecified chronic kidney disease: Secondary | ICD-10-CM | POA: Diagnosis not present

## 2018-10-10 DIAGNOSIS — E876 Hypokalemia: Secondary | ICD-10-CM | POA: Diagnosis not present

## 2018-10-10 DIAGNOSIS — I5033 Acute on chronic diastolic (congestive) heart failure: Secondary | ICD-10-CM | POA: Diagnosis not present

## 2018-10-10 DIAGNOSIS — I5022 Chronic systolic (congestive) heart failure: Secondary | ICD-10-CM | POA: Diagnosis not present

## 2018-10-10 DIAGNOSIS — N390 Urinary tract infection, site not specified: Secondary | ICD-10-CM | POA: Diagnosis not present

## 2018-10-10 DIAGNOSIS — I130 Hypertensive heart and chronic kidney disease with heart failure and stage 1 through stage 4 chronic kidney disease, or unspecified chronic kidney disease: Secondary | ICD-10-CM | POA: Diagnosis not present

## 2018-10-10 DIAGNOSIS — A4151 Sepsis due to Escherichia coli [E. coli]: Secondary | ICD-10-CM | POA: Diagnosis not present

## 2018-10-11 DIAGNOSIS — I5022 Chronic systolic (congestive) heart failure: Secondary | ICD-10-CM | POA: Diagnosis not present

## 2018-10-11 DIAGNOSIS — E876 Hypokalemia: Secondary | ICD-10-CM | POA: Diagnosis not present

## 2018-10-11 DIAGNOSIS — I5033 Acute on chronic diastolic (congestive) heart failure: Secondary | ICD-10-CM | POA: Diagnosis not present

## 2018-10-11 DIAGNOSIS — N390 Urinary tract infection, site not specified: Secondary | ICD-10-CM | POA: Diagnosis not present

## 2018-10-11 DIAGNOSIS — I130 Hypertensive heart and chronic kidney disease with heart failure and stage 1 through stage 4 chronic kidney disease, or unspecified chronic kidney disease: Secondary | ICD-10-CM | POA: Diagnosis not present

## 2018-10-11 DIAGNOSIS — A4151 Sepsis due to Escherichia coli [E. coli]: Secondary | ICD-10-CM | POA: Diagnosis not present

## 2018-10-12 ENCOUNTER — Other Ambulatory Visit: Payer: Self-pay

## 2018-10-16 DIAGNOSIS — I5033 Acute on chronic diastolic (congestive) heart failure: Secondary | ICD-10-CM | POA: Diagnosis not present

## 2018-10-16 DIAGNOSIS — I5022 Chronic systolic (congestive) heart failure: Secondary | ICD-10-CM | POA: Diagnosis not present

## 2018-10-16 DIAGNOSIS — E876 Hypokalemia: Secondary | ICD-10-CM | POA: Diagnosis not present

## 2018-10-16 DIAGNOSIS — N390 Urinary tract infection, site not specified: Secondary | ICD-10-CM | POA: Diagnosis not present

## 2018-10-16 DIAGNOSIS — A4151 Sepsis due to Escherichia coli [E. coli]: Secondary | ICD-10-CM | POA: Diagnosis not present

## 2018-10-16 DIAGNOSIS — I130 Hypertensive heart and chronic kidney disease with heart failure and stage 1 through stage 4 chronic kidney disease, or unspecified chronic kidney disease: Secondary | ICD-10-CM | POA: Diagnosis not present

## 2018-10-18 ENCOUNTER — Encounter: Payer: Medicare Other | Admitting: Internal Medicine

## 2018-10-19 ENCOUNTER — Other Ambulatory Visit: Payer: Self-pay | Admitting: Cardiology

## 2018-10-19 NOTE — Telephone Encounter (Signed)
° ° ° °  1. Which medications need to be refilled? (please list name of each medication and dose if known) amiodarone 200mg  tablet 1QD  2. Which pharmacy/location (including street and city if local pharmacy) is medication to be sent to? [facility]  3. Do they need a 30 day or 90 day supply? [place]

## 2018-10-22 MED ORDER — AMIODARONE HCL 200 MG PO TABS: 200.0000 mg | ORAL_TABLET | Freq: Every day | ORAL | 1 refills | Status: DC

## 2018-10-22 NOTE — Telephone Encounter (Signed)
Amiodarone 200 mg daily refilled.

## 2018-10-24 DIAGNOSIS — I5022 Chronic systolic (congestive) heart failure: Secondary | ICD-10-CM | POA: Diagnosis not present

## 2018-10-24 DIAGNOSIS — N390 Urinary tract infection, site not specified: Secondary | ICD-10-CM | POA: Diagnosis not present

## 2018-10-24 DIAGNOSIS — E876 Hypokalemia: Secondary | ICD-10-CM | POA: Diagnosis not present

## 2018-10-24 DIAGNOSIS — I5033 Acute on chronic diastolic (congestive) heart failure: Secondary | ICD-10-CM | POA: Diagnosis not present

## 2018-10-24 DIAGNOSIS — I130 Hypertensive heart and chronic kidney disease with heart failure and stage 1 through stage 4 chronic kidney disease, or unspecified chronic kidney disease: Secondary | ICD-10-CM | POA: Diagnosis not present

## 2018-10-24 DIAGNOSIS — A4151 Sepsis due to Escherichia coli [E. coli]: Secondary | ICD-10-CM | POA: Diagnosis not present

## 2018-11-01 ENCOUNTER — Telehealth: Payer: Self-pay

## 2018-11-01 ENCOUNTER — Ambulatory Visit (INDEPENDENT_AMBULATORY_CARE_PROVIDER_SITE_OTHER): Payer: Medicare Other

## 2018-11-01 DIAGNOSIS — I480 Paroxysmal atrial fibrillation: Secondary | ICD-10-CM

## 2018-11-01 NOTE — Telephone Encounter (Signed)
Spoke with pt and reminded pt of remote transmission that is due today. Pt verbalized understanding.   

## 2018-11-05 DIAGNOSIS — I519 Heart disease, unspecified: Secondary | ICD-10-CM | POA: Diagnosis not present

## 2018-11-05 DIAGNOSIS — N390 Urinary tract infection, site not specified: Secondary | ICD-10-CM | POA: Diagnosis not present

## 2018-11-05 DIAGNOSIS — E039 Hypothyroidism, unspecified: Secondary | ICD-10-CM | POA: Diagnosis not present

## 2018-11-05 DIAGNOSIS — I509 Heart failure, unspecified: Secondary | ICD-10-CM | POA: Diagnosis not present

## 2018-11-05 DIAGNOSIS — R5383 Other fatigue: Secondary | ICD-10-CM | POA: Diagnosis not present

## 2018-11-05 DIAGNOSIS — I10 Essential (primary) hypertension: Secondary | ICD-10-CM | POA: Diagnosis not present

## 2018-11-05 DIAGNOSIS — Z6831 Body mass index (BMI) 31.0-31.9, adult: Secondary | ICD-10-CM | POA: Diagnosis not present

## 2018-11-05 DIAGNOSIS — N3090 Cystitis, unspecified without hematuria: Secondary | ICD-10-CM | POA: Diagnosis not present

## 2018-11-05 NOTE — Progress Notes (Signed)
Remote pacemaker transmission.   

## 2018-11-06 ENCOUNTER — Encounter: Payer: Self-pay | Admitting: Cardiology

## 2018-11-06 DIAGNOSIS — I5033 Acute on chronic diastolic (congestive) heart failure: Secondary | ICD-10-CM | POA: Diagnosis not present

## 2018-11-06 DIAGNOSIS — E876 Hypokalemia: Secondary | ICD-10-CM | POA: Diagnosis not present

## 2018-11-06 DIAGNOSIS — I5022 Chronic systolic (congestive) heart failure: Secondary | ICD-10-CM | POA: Diagnosis not present

## 2018-11-06 DIAGNOSIS — N390 Urinary tract infection, site not specified: Secondary | ICD-10-CM | POA: Diagnosis not present

## 2018-11-06 DIAGNOSIS — I130 Hypertensive heart and chronic kidney disease with heart failure and stage 1 through stage 4 chronic kidney disease, or unspecified chronic kidney disease: Secondary | ICD-10-CM | POA: Diagnosis not present

## 2018-11-06 DIAGNOSIS — A4151 Sepsis due to Escherichia coli [E. coli]: Secondary | ICD-10-CM | POA: Diagnosis not present

## 2018-11-08 DIAGNOSIS — Z79899 Other long term (current) drug therapy: Secondary | ICD-10-CM | POA: Diagnosis not present

## 2018-11-26 ENCOUNTER — Encounter: Payer: Medicare Other | Admitting: Internal Medicine

## 2018-12-11 ENCOUNTER — Ambulatory Visit: Payer: Medicare Other | Admitting: Cardiology

## 2018-12-16 LAB — CUP PACEART REMOTE DEVICE CHECK
Battery Remaining Longevity: 140 mo
Brady Statistic AP VP Percent: 0.7 %
Brady Statistic AP VS Percent: 0.03 %
Brady Statistic AS VS Percent: 1.86 %
Brady Statistic RV Percent Paced: 0.42 %
Implantable Lead Implant Date: 20190304
Implantable Lead Implant Date: 20190304
Implantable Lead Location: 753859
Implantable Lead Location: 753860
Implantable Lead Model: 4598
Implantable Lead Model: 5076
Lead Channel Impedance Value: 1026 Ohm
Lead Channel Impedance Value: 342 Ohm
Lead Channel Impedance Value: 494 Ohm
Lead Channel Impedance Value: 627 Ohm
Lead Channel Impedance Value: 741 Ohm
Lead Channel Impedance Value: 931 Ohm
Lead Channel Pacing Threshold Amplitude: 0.625 V
Lead Channel Pacing Threshold Amplitude: 0.875 V
Lead Channel Pacing Threshold Pulse Width: 0.4 ms
Lead Channel Pacing Threshold Pulse Width: 0.4 ms
Lead Channel Pacing Threshold Pulse Width: 0.4 ms
Lead Channel Sensing Intrinsic Amplitude: 1.375 mV
Lead Channel Sensing Intrinsic Amplitude: 1.375 mV
Lead Channel Sensing Intrinsic Amplitude: 20 mV
Lead Channel Setting Pacing Amplitude: 1.5 V
Lead Channel Setting Pacing Amplitude: 2 V
Lead Channel Setting Pacing Pulse Width: 0.4 ms
Lead Channel Setting Sensing Sensitivity: 1.2 mV
MDC IDC LEAD IMPLANT DT: 20190304
MDC IDC LEAD LOCATION: 753858
MDC IDC MSMT BATTERY VOLTAGE: 3.04 V
MDC IDC MSMT LEADCHNL LV IMPEDANCE VALUE: 1026 Ohm
MDC IDC MSMT LEADCHNL LV IMPEDANCE VALUE: 1178 Ohm
MDC IDC MSMT LEADCHNL LV IMPEDANCE VALUE: 1178 Ohm
MDC IDC MSMT LEADCHNL LV IMPEDANCE VALUE: 741 Ohm
MDC IDC MSMT LEADCHNL LV IMPEDANCE VALUE: 893 Ohm
MDC IDC MSMT LEADCHNL RA IMPEDANCE VALUE: 456 Ohm
MDC IDC MSMT LEADCHNL RV IMPEDANCE VALUE: 589 Ohm
MDC IDC MSMT LEADCHNL RV IMPEDANCE VALUE: 665 Ohm
MDC IDC MSMT LEADCHNL RV PACING THRESHOLD AMPLITUDE: 0.75 V
MDC IDC MSMT LEADCHNL RV SENSING INTR AMPL: 20 mV
MDC IDC PG IMPLANT DT: 20190304
MDC IDC SESS DTM: 20191205053406
MDC IDC SET LEADCHNL LV PACING AMPLITUDE: 1.5 V
MDC IDC SET LEADCHNL LV PACING PULSEWIDTH: 0.4 ms
MDC IDC STAT BRADY AS VP PERCENT: 97.41 %
MDC IDC STAT BRADY RA PERCENT PACED: 0.74 %

## 2018-12-19 ENCOUNTER — Encounter: Payer: Self-pay | Admitting: Internal Medicine

## 2018-12-19 ENCOUNTER — Ambulatory Visit (INDEPENDENT_AMBULATORY_CARE_PROVIDER_SITE_OTHER): Payer: Medicare Other | Admitting: Internal Medicine

## 2018-12-19 VITALS — BP 130/72 | HR 83 | Ht 64.0 in | Wt 185.0 lb

## 2018-12-19 DIAGNOSIS — Z950 Presence of cardiac pacemaker: Secondary | ICD-10-CM | POA: Diagnosis not present

## 2018-12-19 DIAGNOSIS — I447 Left bundle-branch block, unspecified: Secondary | ICD-10-CM

## 2018-12-19 DIAGNOSIS — I5022 Chronic systolic (congestive) heart failure: Secondary | ICD-10-CM

## 2018-12-19 NOTE — Patient Instructions (Signed)
Medication Instructions:  Your physician recommends that you continue on your current medications as directed. Please refer to the Current Medication list given to you today.  Labwork: None ordered.  Testing/Procedures: None ordered.  Follow-Up: Your physician wants you to follow-up in: one year with Dr. Lovena Le.   You will receive a reminder letter in the mail two months in advance. If you don't receive a letter, please call our office to schedule the follow-up appointment.  Remote monitoring is used to monitor your Pacemaker from home. This monitoring reduces the number of office visits required to check your device to one time per year. It allows [place] to keep an eye on the functioning of your device to ensure it is working properly. You are scheduled for a device check from home on 01/31/2019. You may send your transmission at any time that day. If you have a wireless device, the transmission will be sent automatically. After your physician reviews your transmission, you will receive a postcard with your next transmission date.  Any Other Special Instructions Will Be Listed Below (If Applicable).  If you need a refill on your cardiac medications before your next appointment, please call your pharmacy.

## 2018-12-19 NOTE — Progress Notes (Signed)
HPI [name] returns today for followup. She is a pleasant 85 yo woman with a h/o chronic systolic heart failure, LBBB, who underwent insertion of a biv PPM several months ago. She had normalization of her LV function by echo. She does have some pulmonary HTN. Her biggest proble is getting her house work done. She has not had syncope or palpitations. No chest pain. Minimal peripheral edema. Allergies  Allergen Reactions  . Ampicillin Shortness Of Breath, Swelling and Rash  . Hydromorphone Hcl Shortness Of Breath, Itching, Swelling and Rash  . Penicillins Anaphylaxis and Other (See Comments)    Has patient had a PCN reaction causing immediate rash, facial/tongue/throat swelling, SOB or lightheadedness with hypotension: Yes Has patient had a PCN reaction causing severe rash involving mucus membranes or skin necrosis: Unknown Has patient had a PCN reaction that required hospitalization: Yes (was in the hosp) Has patient had a PCN reaction occurring within the last 10 years: No If all of the above answers are "NO", then may proceed with Cephalosporin use.      Current Outpatient Medications  Medication Sig Dispense Refill  . acetaminophen (TYLENOL) 325 MG tablet Take 2 tablets (650 mg total) by mouth every 6 (six) hours as needed for mild pain (or Fever >/= 101). 30 tablet 1  . amiodarone (PACERONE) 200 MG tablet Take 1 tablet (200 mg total) by mouth daily. 90 tablet 1  . apixaban (ELIQUIS) 2.5 MG TABS tablet Take 1 tablet (2.5 mg total) by mouth 2 (two) times daily. 60 tablet 0  . atorvastatin (LIPITOR) 80 MG tablet Take 1 tablet (80 mg total) by mouth daily. 30 tablet 0  . butalbital-acetaminophen-caffeine (FIORICET, ESGIC) 50-325-40 MG tablet Take 1 tablet by mouth every 4 (four) hours as needed for headache or migraine. 14 tablet 0  . furosemide (LASIX) 40 MG tablet TAKE 2 TABLETS BY MOUTH DAILY 60 tablet 3  . levothyroxine (SYNTHROID, LEVOTHROID) 200 MCG tablet Take 1 tablet  (200 mcg total) by mouth daily before breakfast. (Patient taking differently: Take 175 mcg by mouth daily before breakfast. ) 30 tablet 0  . metoprolol succinate (TOPROL-XL) 50 MG 24 hr tablet Take 1 tablet (50 mg total) by mouth daily. Take with or immediately following a meal. (Patient taking differently: Take 25 mg by mouth daily. Take with or immediately following a meal.) 30 tablet 0  . spironolactone (ALDACTONE) 25 MG tablet Take 1 tablet (25 mg total) by mouth daily. 90 tablet 3  . tamsulosin (FLOMAX) 0.4 MG CAPS capsule Take 0.8 mg by mouth daily.     No current facility-administered medications for this visit.      Past Medical History:  Diagnosis Date  . Anxiety    "some; not treated for it" (11/27/2014)  . Bradycardia   . CAD (coronary artery disease), native coronary artery    2003 normal left main, 80% mid LAD  2.5 x 13 mm Cypher stent to LAD Dr. Rex Kras, subsequent cath 2004 showed patent stent in LAD with no sig disease in RCA or Circ;    . CHF (congestive heart failure) ([place])   . CKD (chronic kidney disease), stage III ([place])   . GERD (gastroesophageal reflux disease)   . History of hiatal hernia   . Hyperlipidemia   . Hypertension   . LBBB (left bundle branch block)   . Migraine    "very rare now" (11/27/2014)  . Obesity (BMI 30-39.9) 06/18/2017  . On home oxygen therapy    "  only at night; ? setting" (11/27/2014)  . Osteoarthritis cervical spine   . Sleep apnea 06/18/2017    ROS:   All systems reviewed and negative except as noted in the HPI.   Past Surgical History:  Procedure Laterality Date  . ABDOMINAL HYSTERECTOMY    . BIV PACEMAKER INSERTION CRT-P N/A 01/29/2018   Procedure: BIV PACEMAKER INSERTION CRT-P;  Surgeon: Evans Lance, MD;  Location: [facility];  Service: Cardiovascular;  Laterality: N/A;  . CARDIOVERSION N/A 12/05/2017   Procedure: CARDIOVERSION;  Surgeon: Jolaine Artist, MD;  Location: [facility] ENDOSCOPY;  Service: Cardiovascular;   Laterality: N/A;  . CATARACT EXTRACTION W/ INTRAOCULAR LENS  IMPLANT, BILATERAL Bilateral   . CYSTOCELE REPAIR       Family History  Problem Relation Age of Onset  . Heart attack Mother   . Hypertension Mother   . Heart Problems Mother   . Heart attack Father   . Hypertension Father      Social History   Socioeconomic History  . Marital status: Divorced    Spouse name: Not on file  . Number of children: 3  . Years of education: college  . Highest education level: Not on file  Occupational History  . Occupation: Retired   Scientific laboratory technician  . Financial resource strain: Not on file  . Food insecurity:    Worry: Not on file    Inability: Not on file  . Transportation needs:    Medical: Not on file    Non-medical: Not on file  Tobacco Use  . Smoking status: Never Smoker  . Smokeless tobacco: Never Used  Substance and Sexual Activity  . Alcohol use: No    Alcohol/week: 0.0 standard drinks  . Drug use: No  . Sexual activity: Never  Lifestyle  . Physical activity:    Days per week: Not on file    Minutes per session: Not on file  . Stress: Not on file  Relationships  . Social connections:    Talks on phone: Not on file    Gets together: Not on file    Attends religious service: Not on file    Active member of club or organization: Not on file    Attends meetings of clubs or organizations: Not on file    Relationship status: Not on file  . Intimate partner violence:    Fear of current or ex partner: Not on file    Emotionally abused: Not on file    Physically abused: Not on file    Forced sexual activity: Not on file  Other Topics Concern  . Not on file  Social History Narrative   Drinks tea, occasional coffee     BP 130/72   Pulse 83   Ht 5\' 4"  (1.626 m)   Wt 185 lb (83.9 kg)   SpO2 99%   BMI 31.76 kg/m   Physical Exam:  Well appearing NAD HEENT: Unremarkable Neck:  No JVD, no thyromegally Lymphatics:  No adenopathy Back:  No CVA tenderness Lungs:   Clear with no wheezes HEART:  Regular rate rhythm, no murmurs, no rubs, no clicks Abd:  soft, positive bowel sounds, no organomegally, no rebound, no guarding Ext:  2 plus pulses, no edema, no cyanosis, no clubbing Skin:  No rashes no nodules Neuro:  CN II through XII intact, motor grossly intact  EKG - nsr with biv pacing  DEVICE  Normal device function.  See PaceArt for details.   Assess/Plan: 1. Chronic systolic  heart failure - she has had normalization of her LV function. She will continue her current meds. She will maintain a low sodium diet. 2. CAD - she denies anginal symptoms. No change in meds. 3. PPM - her medtronic DDD biv ppm is working normally. We will recheck in several months.. 4. Persistent atrial fib - on amio, she has maintained NSR very nicely. Continue 200 mg daily. She will reduce her dose when I see her again in a year if she is maintaining NSR.  Mikle Bosworth.D.

## 2018-12-20 LAB — CUP PACEART INCLINIC DEVICE CHECK
Battery Voltage: 3.03 V
Brady Statistic AP VP Percent: 4.74 %
Brady Statistic AP VS Percent: 0.12 %
Brady Statistic AS VS Percent: 1.8 %
Brady Statistic RV Percent Paced: 0.84 %
Date Time Interrogation Session: 20200122214632
Implantable Lead Implant Date: 20190304
Implantable Lead Location: 753859
Implantable Lead Location: 753860
Implantable Lead Model: 5076
Implantable Lead Model: 5076
Lead Channel Impedance Value: 1140 Ohm
Lead Channel Impedance Value: 532 Ohm
Lead Channel Impedance Value: 722 Ohm
Lead Channel Pacing Threshold Amplitude: 0.875 V
Lead Channel Sensing Intrinsic Amplitude: 20.875 mV
Lead Channel Sensing Intrinsic Amplitude: 29.75 mV
Lead Channel Setting Pacing Amplitude: 1.5 V
Lead Channel Setting Pacing Amplitude: 2 V
Lead Channel Setting Pacing Pulse Width: 0.4 ms
MDC IDC LEAD IMPLANT DT: 20190304
MDC IDC LEAD IMPLANT DT: 20190304
MDC IDC LEAD LOCATION: 753858
MDC IDC MSMT BATTERY REMAINING LONGEVITY: 139 mo
MDC IDC MSMT LEADCHNL LV IMPEDANCE VALUE: 1045 Ohm
MDC IDC MSMT LEADCHNL LV IMPEDANCE VALUE: 1121 Ohm
MDC IDC MSMT LEADCHNL LV IMPEDANCE VALUE: 1292 Ohm
MDC IDC MSMT LEADCHNL LV IMPEDANCE VALUE: 1292 Ohm
MDC IDC MSMT LEADCHNL LV IMPEDANCE VALUE: 969 Ohm
MDC IDC MSMT LEADCHNL LV PACING THRESHOLD PULSEWIDTH: 0.4 ms
MDC IDC MSMT LEADCHNL RA IMPEDANCE VALUE: 380 Ohm
MDC IDC MSMT LEADCHNL RA IMPEDANCE VALUE: 494 Ohm
MDC IDC MSMT LEADCHNL RA PACING THRESHOLD AMPLITUDE: 0.5 V
MDC IDC MSMT LEADCHNL RA PACING THRESHOLD PULSEWIDTH: 0.4 ms
MDC IDC MSMT LEADCHNL RA SENSING INTR AMPL: 1.25 mV
MDC IDC MSMT LEADCHNL RA SENSING INTR AMPL: 1.5 mV
MDC IDC MSMT LEADCHNL RV IMPEDANCE VALUE: 646 Ohm
MDC IDC MSMT LEADCHNL RV PACING THRESHOLD AMPLITUDE: 0.75 V
MDC IDC MSMT LEADCHNL RV PACING THRESHOLD PULSEWIDTH: 0.4 ms
MDC IDC PG IMPLANT DT: 20190304
MDC IDC SET LEADCHNL RA PACING AMPLITUDE: 1.5 V
MDC IDC SET LEADCHNL RV PACING PULSEWIDTH: 0.4 ms
MDC IDC SET LEADCHNL RV SENSING SENSITIVITY: 1.2 mV
MDC IDC STAT BRADY AS VP PERCENT: 93.34 %
MDC IDC STAT BRADY RA PERCENT PACED: 4.86 %

## 2018-12-21 NOTE — Addendum Note (Signed)
Addended by: Rose Phi on: 12/21/2018 05:07 PM   Modules accepted: Orders

## 2018-12-26 ENCOUNTER — Ambulatory Visit: Payer: Medicare Other | Admitting: Cardiology

## 2019-01-18 ENCOUNTER — Ambulatory Visit: Payer: Medicare Other | Admitting: Cardiology

## 2019-01-23 ENCOUNTER — Other Ambulatory Visit (HOSPITAL_COMMUNITY): Payer: Self-pay | Admitting: Internal Medicine

## 2019-01-24 DIAGNOSIS — I509 Heart failure, unspecified: Secondary | ICD-10-CM | POA: Diagnosis not present

## 2019-01-24 DIAGNOSIS — N289 Disorder of kidney and ureter, unspecified: Secondary | ICD-10-CM | POA: Diagnosis not present

## 2019-01-24 DIAGNOSIS — Z79899 Other long term (current) drug therapy: Secondary | ICD-10-CM | POA: Diagnosis not present

## 2019-01-24 DIAGNOSIS — R739 Hyperglycemia, unspecified: Secondary | ICD-10-CM | POA: Diagnosis not present

## 2019-01-24 DIAGNOSIS — E785 Hyperlipidemia, unspecified: Secondary | ICD-10-CM | POA: Diagnosis not present

## 2019-01-24 DIAGNOSIS — E039 Hypothyroidism, unspecified: Secondary | ICD-10-CM | POA: Diagnosis not present

## 2019-01-31 ENCOUNTER — Ambulatory Visit (INDEPENDENT_AMBULATORY_CARE_PROVIDER_SITE_OTHER): Payer: Medicare Other | Admitting: *Deleted

## 2019-01-31 DIAGNOSIS — E039 Hypothyroidism, unspecified: Secondary | ICD-10-CM | POA: Diagnosis not present

## 2019-01-31 DIAGNOSIS — I255 Ischemic cardiomyopathy: Secondary | ICD-10-CM

## 2019-01-31 DIAGNOSIS — E782 Mixed hyperlipidemia: Secondary | ICD-10-CM | POA: Diagnosis not present

## 2019-01-31 DIAGNOSIS — I454 Nonspecific intraventricular block: Secondary | ICD-10-CM | POA: Diagnosis not present

## 2019-01-31 DIAGNOSIS — I519 Heart disease, unspecified: Secondary | ICD-10-CM | POA: Diagnosis not present

## 2019-01-31 DIAGNOSIS — Z6833 Body mass index (BMI) 33.0-33.9, adult: Secondary | ICD-10-CM | POA: Diagnosis not present

## 2019-01-31 DIAGNOSIS — I509 Heart failure, unspecified: Secondary | ICD-10-CM | POA: Diagnosis not present

## 2019-01-31 DIAGNOSIS — N289 Disorder of kidney and ureter, unspecified: Secondary | ICD-10-CM | POA: Diagnosis not present

## 2019-02-01 LAB — CUP PACEART REMOTE DEVICE CHECK
Battery Remaining Longevity: 137 mo
Battery Voltage: 3.03 V
Brady Statistic AP VP Percent: 3.84 %
Brady Statistic AP VS Percent: 0.1 %
Brady Statistic AS VP Percent: 94.22 %
Brady Statistic AS VS Percent: 1.85 %
Brady Statistic RA Percent Paced: 3.93 %
Date Time Interrogation Session: 20200306111629
Implantable Lead Implant Date: 20190304
Implantable Lead Implant Date: 20190304
Implantable Lead Implant Date: 20190304
Implantable Lead Location: 753858
Implantable Lead Location: 753859
Implantable Lead Model: 4598
Implantable Lead Model: 5076
Implantable Pulse Generator Implant Date: 20190304
Lead Channel Impedance Value: 1026 Ohm
Lead Channel Impedance Value: 1159 Ohm
Lead Channel Impedance Value: 1159 Ohm
Lead Channel Impedance Value: 361 Ohm
Lead Channel Impedance Value: 494 Ohm
Lead Channel Impedance Value: 513 Ohm
Lead Channel Impedance Value: 513 Ohm
Lead Channel Impedance Value: 551 Ohm
Lead Channel Impedance Value: 627 Ohm
Lead Channel Impedance Value: 741 Ohm
Lead Channel Impedance Value: 741 Ohm
Lead Channel Impedance Value: 912 Ohm
Lead Channel Impedance Value: 950 Ohm
Lead Channel Pacing Threshold Amplitude: 0.75 V
Lead Channel Pacing Threshold Amplitude: 0.875 V
Lead Channel Pacing Threshold Amplitude: 1 V
Lead Channel Pacing Threshold Pulse Width: 0.4 ms
Lead Channel Pacing Threshold Pulse Width: 0.4 ms
Lead Channel Pacing Threshold Pulse Width: 0.4 ms
Lead Channel Sensing Intrinsic Amplitude: 1.125 mV
Lead Channel Sensing Intrinsic Amplitude: 21.25 mV
Lead Channel Sensing Intrinsic Amplitude: 21.25 mV
Lead Channel Setting Pacing Amplitude: 1.75 V
Lead Channel Setting Pacing Amplitude: 2 V
Lead Channel Setting Pacing Pulse Width: 0.4 ms
Lead Channel Setting Pacing Pulse Width: 0.4 ms
MDC IDC LEAD LOCATION: 753860
MDC IDC MSMT LEADCHNL LV IMPEDANCE VALUE: 1045 Ohm
MDC IDC MSMT LEADCHNL RA SENSING INTR AMPL: 1.125 mV
MDC IDC SET LEADCHNL LV PACING AMPLITUDE: 1.5 V
MDC IDC SET LEADCHNL RV SENSING SENSITIVITY: 1.2 mV
MDC IDC STAT BRADY RV PERCENT PACED: 0.73 %

## 2019-02-07 ENCOUNTER — Ambulatory Visit: Payer: Medicare Other | Admitting: Cardiology

## 2019-02-08 ENCOUNTER — Encounter: Payer: Self-pay | Admitting: Cardiology

## 2019-02-08 ENCOUNTER — Ambulatory Visit: Payer: Medicare Other | Admitting: Cardiology

## 2019-02-08 NOTE — Progress Notes (Signed)
Remote pacemaker transmission.   

## 2019-02-26 ENCOUNTER — Telehealth: Payer: Self-pay | Admitting: Emergency Medicine

## 2019-02-26 NOTE — Telephone Encounter (Signed)
Patient rescheduled for office visit next week in office per Dr. Agustin Cree. Patient aware. No further questions.

## 2019-02-26 NOTE — Telephone Encounter (Signed)
Called patient to discuss switching appointment tomorrow to a televisit. She isn't comfortable doing this as she hasn't been feeling well. She has increased shortness of breath when getting up and walking from her living room to the kitchen. She also reports she noticed her heart rate went down to 61 recently. She would prefer a office visit and is willing to go to [place] office next week. I will forward to Dr. Agustin Cree and follow up with patient.

## 2019-02-27 ENCOUNTER — Ambulatory Visit: Payer: Medicare Other | Admitting: Cardiology

## 2019-03-07 ENCOUNTER — Encounter: Payer: Self-pay | Admitting: Cardiology

## 2019-03-07 ENCOUNTER — Ambulatory Visit (INDEPENDENT_AMBULATORY_CARE_PROVIDER_SITE_OTHER): Payer: Medicare Other | Admitting: Cardiology

## 2019-03-07 ENCOUNTER — Other Ambulatory Visit: Payer: Self-pay

## 2019-03-07 VITALS — BP 124/72 | HR 74 | Wt 195.2 lb

## 2019-03-07 DIAGNOSIS — I447 Left bundle-branch block, unspecified: Secondary | ICD-10-CM | POA: Diagnosis not present

## 2019-03-07 DIAGNOSIS — I272 Pulmonary hypertension, unspecified: Secondary | ICD-10-CM

## 2019-03-07 DIAGNOSIS — I4819 Other persistent atrial fibrillation: Secondary | ICD-10-CM

## 2019-03-07 DIAGNOSIS — R5382 Chronic fatigue, unspecified: Secondary | ICD-10-CM

## 2019-03-07 DIAGNOSIS — Z950 Presence of cardiac pacemaker: Secondary | ICD-10-CM

## 2019-03-07 DIAGNOSIS — I119 Hypertensive heart disease without heart failure: Secondary | ICD-10-CM | POA: Diagnosis not present

## 2019-03-07 DIAGNOSIS — R0600 Dyspnea, unspecified: Secondary | ICD-10-CM | POA: Diagnosis not present

## 2019-03-07 DIAGNOSIS — I2510 Atherosclerotic heart disease of native coronary artery without angina pectoris: Secondary | ICD-10-CM

## 2019-03-07 NOTE — Patient Instructions (Signed)
Medication Instructions:  Your physician recommends that you continue on your current medications as directed. Please refer to the Current Medication list given to you today.  If you need a refill on your cardiac medications before your next appointment, please call your pharmacy.   Lab work: Your physician recommends that you return for lab work today BMP, BNP  If you have labs (blood work) drawn today and your tests are completely normal, you will receive your results only by: Marland Kitchen MyChart Message (if you have MyChart) OR . A paper copy in the mail If you have any lab test that is abnormal or we need to change your treatment, we will call you to review the results.  Testing/Procedures: None  Follow-Up: At [facility], you and your health needs are our priority.  As part of our continuing mission to provide you with exceptional heart care, we have created designated Provider Care Teams.  These Care Teams include your primary Cardiologist (physician) and Advanced Practice Providers (APPs -  Physician Assistants and Nurse Practitioners) who all work together to provide you with the care you need, when you need it. You will need a follow up appointment in 1 months.  Please call our office 2 months in advance to schedule this appointment.  You may see No primary care provider on file. or another member of our [employer] Provider Team in [place]: Shirlee More, MD . Jyl Heinz, MD  Any Other Special Instructions Will Be Listed Below (If Applicable).

## 2019-03-07 NOTE — Progress Notes (Signed)
Cardiology Office Note:    Date:  03/07/2019   ID:  [name], [name] 04/07/1932, MRN [id number]  PCP:  Fanny Bien, MD  Cardiologist:  Jenne Campus, MD    Referring MD: Fanny Bien, MD   Chief Complaint  Patient presents with  . Bradycardia    Over the weekend, with low O2, with low energy  I will weak and tired  History of Present Illness:    [name] is a 85 y.o. female with a history of cardiomyopathy, left bundle branch block, status post Bi V pacer implantation with normalization of left ventricular ejection fraction.  Also history of pulmonary hypertension.  She requested to be seen today because of few reasons #1 she checked her oxygen and sometimes oxygen less than 90 on top of that when she check her heart rate with pulse oximetry she noted her heart rate being 49.  She is very worried about it and that is why she requested to be seen.  She still trying to take care of activities daily living.  She does get short of breath easily but this is not new complaint however overall she said that she is feeling worst no swelling of lower extremities no proximal nocturnal dyspnea no palpitations no dizziness no passing out.  Past Medical History:  Diagnosis Date  . Anxiety    "some; not treated for it" (11/27/2014)  . Bradycardia   . CAD (coronary artery disease), native coronary artery    2003 normal left main, 80% mid LAD  2.5 x 13 mm Cypher stent to LAD Dr. Rex Kras, subsequent cath 2004 showed patent stent in LAD with no sig disease in RCA or Circ;    . CHF (congestive heart failure) ([place])   . CKD (chronic kidney disease), stage III ([place])   . GERD (gastroesophageal reflux disease)   . History of hiatal hernia   . Hyperlipidemia   . Hypertension   . LBBB (left bundle branch block)   . Migraine    "very rare now" (11/27/2014)  . Obesity (BMI 30-39.9) 06/18/2017  . On home oxygen therapy    "only at night; ? setting" (11/27/2014)  . Osteoarthritis  cervical spine   . Sleep apnea 06/18/2017    Past Surgical History:  Procedure Laterality Date  . ABDOMINAL HYSTERECTOMY    . BIV PACEMAKER INSERTION CRT-P N/A 01/29/2018   Procedure: BIV PACEMAKER INSERTION CRT-P;  Surgeon: Evans Lance, MD;  Location: [facility];  Service: Cardiovascular;  Laterality: N/A;  . CARDIOVERSION N/A 12/05/2017   Procedure: CARDIOVERSION;  Surgeon: Jolaine Artist, MD;  Location: [facility] ENDOSCOPY;  Service: Cardiovascular;  Laterality: N/A;  . CATARACT EXTRACTION W/ INTRAOCULAR LENS  IMPLANT, BILATERAL Bilateral   . CYSTOCELE REPAIR      Current Medications: Current Meds  Medication Sig  . acetaminophen (TYLENOL) 325 MG tablet Take 2 tablets (650 mg total) by mouth every 6 (six) hours as needed for mild pain (or Fever >/= 101).  Marland Kitchen amiodarone (PACERONE) 200 MG tablet Take 1 tablet (200 mg total) by mouth daily.  Marland Kitchen apixaban (ELIQUIS) 2.5 MG TABS tablet Take 1 tablet (2.5 mg total) by mouth 2 (two) times daily.  Marland Kitchen atorvastatin (LIPITOR) 80 MG tablet Take 1 tablet (80 mg total) by mouth daily.  . butalbital-acetaminophen-caffeine (FIORICET, ESGIC) 50-325-40 MG tablet Take 1 tablet by mouth every 4 (four) hours as needed for headache or migraine.  . fexofenadine (ALLEGRA) 180 MG tablet Take 180 mg  by mouth daily.  . furosemide (LASIX) 40 MG tablet TAKE 2 TABLETS BY MOUTH DAILY  . levothyroxine (SYNTHROID, LEVOTHROID) 125 MCG tablet Take 125 mcg by mouth daily before breakfast.  . metoprolol tartrate (LOPRESSOR) 25 MG tablet Take 25 mg by mouth 2 (two) times daily.  Marland Kitchen omeprazole (PRILOSEC OTC) 20 MG tablet Take 20 mg by mouth daily.     Allergies:   Ampicillin; Hydromorphone hcl; and Penicillins   Social History   Socioeconomic History  . Marital status: Divorced    Spouse name: Not on file  . Number of children: 3  . Years of education: college  . Highest education level: Not on file  Occupational History  . Occupation: Retired   Scientific laboratory technician   . Financial resource strain: Not on file  . Food insecurity:    Worry: Not on file    Inability: Not on file  . Transportation needs:    Medical: Not on file    Non-medical: Not on file  Tobacco Use  . Smoking status: Never Smoker  . Smokeless tobacco: Never Used  Substance and Sexual Activity  . Alcohol use: No    Alcohol/week: 0.0 standard drinks  . Drug use: No  . Sexual activity: Never  Lifestyle  . Physical activity:    Days per week: Not on file    Minutes per session: Not on file  . Stress: Not on file  Relationships  . Social connections:    Talks on phone: Not on file    Gets together: Not on file    Attends religious service: Not on file    Active member of club or organization: Not on file    Attends meetings of clubs or organizations: Not on file    Relationship status: Not on file  Other Topics Concern  . Not on file  Social History Narrative   Drinks tea, occasional coffee     Family History: The patient's family history includes Heart Problems in her mother; Heart attack in her father and mother; Hypertension in her father and mother. ROS:   Please see the history of present illness.    All 14 point review of systems negative except as described per history of present illness  EKGs/Labs/Other Studies Reviewed:      Recent Labs: 09/14/2018: Magnesium 2.0 09/15/2018: ALT 17 10/03/2018: BUN 20; Creatinine, Ser 1.71; Hemoglobin 13.1; NT-Pro BNP 286; Platelets 335; Potassium 3.6; Sodium 143  Recent Lipid Panel No results found for: CHOL, TRIG, HDL, CHOLHDL, VLDL, LDLCALC, LDLDIRECT  Physical Exam:    VS:  BP 124/72   Pulse 74   Wt 195 lb 3.2 oz (88.5 kg)   SpO2 96%   BMI 33.51 kg/m     Wt Readings from Last 3 Encounters:  03/07/19 195 lb 3.2 oz (88.5 kg)  12/19/18 185 lb (83.9 kg)  10/03/18 188 lb 12.8 oz (85.6 kg)     GEN:  Well nourished, well developed in no acute distress HEENT: Normal NECK: No JVD; No carotid bruits LYMPHATICS: No  lymphadenopathy CARDIAC: RRR, no murmurs, no rubs, no gallops RESPIRATORY:  Clear to auscultation without rales, wheezing or rhonchi  ABDOMEN: Soft, non-tender, non-distended MUSCULOSKELETAL:  No edema; No deformity  SKIN: Warm and dry LOWER EXTREMITIES: no swelling NEUROLOGIC:  Alert and oriented x 3 PSYCHIATRIC:  Normal affect   ASSESSMENT:    1. Persistent atrial fibrillation   2. Status post biventricular pacemaker   3. Dyspnea, unspecified type   4. Hypertensive heart  disease without CHF   5. LBBB (left bundle branch block)   6. Coronary artery disease involving native coronary artery of native heart without angina pectoris   7. Chronic fatigue    PLAN:    In order of problems listed above:  1. She is maintaining sinus rhythm right now.  EKG done today showed sinus rhythm with VAT pacing. 2. Pacemaker present.  Recent interrogation of the device showed normal function with more than 98% of V being paced.  There is one episode what appears to be in ventricular tachycardia.  Her lower paced rate is set about 50 bpm. 3. Dyspnea probably multifactorial more about her pulmonary pressure getting worse.  In the future she required another echocardiogram however we need to wait until coronavirus situation will stabilize. 4. Hypertension.  Blood pressure today is normal we will continue present management. 5. Coronary artery disease stable 6. Chronic fatigue I will ask you to have Chem-7 done as well as proBNP today.  Overall she looks good I worry about her pulmonary hypertension getting worse therefore echocardiogram will be scheduled in the future.  I will do Chem-7 as well as proBNP today.  I explained to her that her pacemaker is set up at rate of 50 and pulse oximetry sometimes is in acuity measuring heart rate precisely.  Therefore asked her not to be worried she see heart rate 48-49.  Her pulse oximetry today was 97%.   Medication Adjustments/Labs and Tests Ordered: Current  medicines are reviewed at length with the patient today.  Concerns regarding medicines are outlined above.  Orders Placed This Encounter  Procedures  . Basic Metabolic Panel (BMET)  . Pro b natriuretic peptide  . EKG 12-Lead   Medication changes: No orders of the defined types were placed in this encounter.   Signed, Park Liter, MD, [facility] 03/07/2019 1:46 PM    [place]

## 2019-03-07 NOTE — Progress Notes (Signed)
ekg 

## 2019-03-08 ENCOUNTER — Telehealth: Payer: Self-pay | Admitting: Cardiology

## 2019-03-08 LAB — BASIC METABOLIC PANEL
BUN/Creatinine Ratio: 13 (ref 12–28)
BUN: 16 mg/dL (ref 8–27)
CO2: 26 mmol/L (ref 20–29)
Calcium: 9.7 mg/dL (ref 8.7–10.3)
Chloride: 97 mmol/L (ref 96–106)
Creatinine, Ser: 1.26 mg/dL — ABNORMAL HIGH (ref 0.57–1.00)
GFR calc Af Amer: 45 mL/min/{1.73_m2} — ABNORMAL LOW (ref >59)
GFR calc non Af Amer: 39 mL/min/{1.73_m2} — ABNORMAL LOW (ref >59)
Glucose: 108 mg/dL — ABNORMAL HIGH (ref 65–99)
Potassium: 3.5 mmol/L (ref 3.5–5.2)
Sodium: 144 mmol/L (ref 134–144)

## 2019-03-08 LAB — PRO B NATRIURETIC PEPTIDE: NT-Pro BNP: 227 pg/mL (ref 0–738)

## 2019-03-08 NOTE — Telephone Encounter (Signed)
Patient called and stated that her home monitor was making a rugged ringing sound. I confirmed that it was the monitor and not her implanted device. Informed pt that the monitor only makes beeping noises while in use. Pt verbalized understanding and decided to not send a manual transmission to be safe.

## 2019-03-12 DIAGNOSIS — R51 Headache: Secondary | ICD-10-CM | POA: Diagnosis not present

## 2019-03-12 DIAGNOSIS — J309 Allergic rhinitis, unspecified: Secondary | ICD-10-CM | POA: Diagnosis not present

## 2019-03-12 DIAGNOSIS — I10 Essential (primary) hypertension: Secondary | ICD-10-CM | POA: Diagnosis not present

## 2019-03-12 DIAGNOSIS — I519 Heart disease, unspecified: Secondary | ICD-10-CM | POA: Diagnosis not present

## 2019-03-20 ENCOUNTER — Other Ambulatory Visit: Payer: Self-pay | Admitting: Cardiology

## 2019-03-20 ENCOUNTER — Other Ambulatory Visit: Payer: Self-pay | Admitting: *Deleted

## 2019-03-20 MED ORDER — APIXABAN 2.5 MG PO TABS: 2.5000 mg | ORAL_TABLET | Freq: Two times a day (BID) | ORAL | 2 refills | Status: AC

## 2019-03-20 MED ORDER — APIXABAN 2.5 MG PO TABS: 2.5000 mg | ORAL_TABLET | Freq: Two times a day (BID) | ORAL | 0 refills | Status: DC

## 2019-04-01 ENCOUNTER — Other Ambulatory Visit: Payer: Self-pay | Admitting: Cardiology

## 2019-04-02 DIAGNOSIS — I10 Essential (primary) hypertension: Secondary | ICD-10-CM | POA: Diagnosis not present

## 2019-04-02 DIAGNOSIS — R51 Headache: Secondary | ICD-10-CM | POA: Diagnosis not present

## 2019-04-02 DIAGNOSIS — J309 Allergic rhinitis, unspecified: Secondary | ICD-10-CM | POA: Diagnosis not present

## 2019-04-08 ENCOUNTER — Telehealth (INDEPENDENT_AMBULATORY_CARE_PROVIDER_SITE_OTHER): Payer: Medicare Other | Admitting: Cardiology

## 2019-04-08 ENCOUNTER — Other Ambulatory Visit: Payer: Self-pay

## 2019-04-08 ENCOUNTER — Encounter: Payer: Self-pay | Admitting: Cardiology

## 2019-04-08 VITALS — HR 54

## 2019-04-08 DIAGNOSIS — I2510 Atherosclerotic heart disease of native coronary artery without angina pectoris: Secondary | ICD-10-CM

## 2019-04-08 DIAGNOSIS — I480 Paroxysmal atrial fibrillation: Secondary | ICD-10-CM

## 2019-04-08 DIAGNOSIS — I272 Pulmonary hypertension, unspecified: Secondary | ICD-10-CM | POA: Diagnosis not present

## 2019-04-08 DIAGNOSIS — Z950 Presence of cardiac pacemaker: Secondary | ICD-10-CM

## 2019-04-08 NOTE — Addendum Note (Signed)
Addended by: Ashok Norris on: 04/08/2019 03:21 PM   Modules accepted: Orders

## 2019-04-08 NOTE — Progress Notes (Signed)
Virtual Visit via Telephone Note   This visit type was conducted due to national recommendations for restrictions regarding the COVID-19 Pandemic (e.g. social distancing) in an effort to limit this patient's exposure and mitigate transmission in our community.  Due to her co-morbid illnesses, this patient is at least at moderate risk for complications without adequate follow up.  This format is felt to be most appropriate for this patient at this time.  The patient did not have access to video technology/had technical difficulties with video requiring transitioning to audio format only (telephone).  All issues noted in this document were discussed and addressed.  No physical exam could be performed with this format.  Please refer to the patient's chart for her  consent to telehealth for [facility].  Evaluation Performed:  Follow-up visit  This visit type was conducted due to national recommendations for restrictions regarding the COVID-19 Pandemic (e.g. social distancing).  This format is felt to be most appropriate for this patient at this time.  All issues noted in this document were discussed and addressed.  No physical exam was performed (except for noted visual exam findings with Video Visits).  Please refer to the patient's chart (MyChart message for video visits and phone note for telephone visits) for the patient's consent to telehealth for [facility].  Date:  04/08/2019  ID: [name], [name] 07-14-1932, MRN [id number]   Patient Location: [address] [place] [address]   Provider location:   [place] Office  PCP:  Fanny Bien, MD  Cardiologist:  Jenne Campus, MD     Chief Complaint: Doing better but still short of breath  History of Present Illness:    [name] is a 85 y.o. female  who presents via audio/video conferencing for a telehealth visit today.  With pulmonary hypertension, coronary artery disease status post PTCA  and stenting in 2003 of mid LAD.  Last time I seen her she was complaining of having some fatigue and tiredness she was also aware about her heart rate being too slow her device has been interrogated functioning properly set up at rate of 50 but majority of time she is atrial driven.  Denies having any chest pain tightness squeezing pressure burning chest does have some exertional shortness of breath.   The patient does not have symptoms concerning for COVID-19 infection (fever, chills, cough, or new SHORTNESS OF BREATH).    Prior CV studies:   The following studies were reviewed today:       Past Medical History:  Diagnosis Date   Anxiety    "some; not treated for it" (11/27/2014)   Bradycardia    CAD (coronary artery disease), native coronary artery    2003 normal left main, 80% mid LAD  2.5 x 13 mm Cypher stent to LAD Dr. Rex Kras, subsequent cath 2004 showed patent stent in LAD with no sig disease in RCA or Circ;     CHF (congestive heart failure) (HCC)    CKD (chronic kidney disease), stage III ([place])    GERD (gastroesophageal reflux disease)    History of hiatal hernia    Hyperlipidemia    Hypertension    LBBB (left bundle branch block)    Migraine    "very rare now" (11/27/2014)   Obesity (BMI 30-39.9) 06/18/2017   On home oxygen therapy    "only at night; ? setting" (11/27/2014)   Osteoarthritis cervical spine    Sleep apnea 06/18/2017  Past Surgical History:  Procedure Laterality Date   ABDOMINAL HYSTERECTOMY     BIV PACEMAKER INSERTION CRT-P N/A 01/29/2018   Procedure: BIV PACEMAKER INSERTION CRT-P;  Surgeon: Evans Lance, MD;  Location: [place] [facility];  Service: Cardiovascular;  Laterality: N/A;   CARDIOVERSION N/A 12/05/2017   Procedure: CARDIOVERSION;  Surgeon: Jolaine Artist, MD;  [facility];  Service: Cardiovascular;  Laterality: N/A;   CATARACT EXTRACTION W/ INTRAOCULAR LENS  IMPLANT, BILATERAL Bilateral    CYSTOCELE  REPAIR       Current Meds  Medication Sig   acetaminophen (TYLENOL) 325 MG tablet Take 2 tablets (650 mg total) by mouth every 6 (six) hours as needed for mild pain (or Fever >/= 101).   amiodarone (PACERONE) 200 MG tablet TAKE 1 TABLET BY MOUTH DAILY   apixaban (ELIQUIS) 2.5 MG TABS tablet Take 1 tablet (2.5 mg total) by mouth 2 (two) times daily.   atorvastatin (LIPITOR) 80 MG tablet Take 1 tablet (80 mg total) by mouth daily.   butalbital-acetaminophen-caffeine (FIORICET, ESGIC) 50-325-40 MG tablet Take 1 tablet by mouth every 4 (four) hours as needed for headache or migraine.   fexofenadine (ALLEGRA) 180 MG tablet Take 180 mg by mouth daily.   furosemide (LASIX) 40 MG tablet TAKE 2 TABLETS BY MOUTH DAILY   levothyroxine (SYNTHROID, LEVOTHROID) 125 MCG tablet Take 125 mcg by mouth daily before breakfast.   metoprolol tartrate (LOPRESSOR) 25 MG tablet Take 25 mg by mouth 2 (two) times daily.   omeprazole (PRILOSEC OTC) 20 MG tablet Take 20 mg by mouth daily.      Family History: The patient's family history includes Heart Problems in her mother; Heart attack in her father and mother; Hypertension in her father and mother.   ROS:   Please see the history of present illness.     All other systems reviewed and are negative.   Labs/Other Tests and Data Reviewed:     Recent Labs: 09/14/2018: Magnesium 2.0 09/15/2018: ALT 17 10/03/2018: Hemoglobin 13.1; Platelets 335 03/07/2019: BUN 16; Creatinine, Ser 1.26; NT-Pro BNP 227; Potassium 3.5; Sodium 144  Recent Lipid Panel No results found for: CHOL, TRIG, HDL, CHOLHDL, VLDL, LDLCALC, LDLDIRECT    Exam:    Vital Signs:  Pulse (!) 54    SpO2 94%     Wt Readings from Last 3 Encounters:  03/07/19 195 lb 3.2 oz (88.5 kg)  12/19/18 185 lb (83.9 kg)  10/03/18 188 lb 12.8 oz (85.6 kg)     Well nourished, well developed in no acute distress. Alert awake oriented x3.  She has no technique ability to establish video link  therefore we talk only over the phone she is happy to be able to talk to me  Diagnosis for this visit:   1. Pulmonary hypertension, unspecified ([place])   2. Paroxysmal atrial fibrillation ([place])   3. Coronary artery disease involving native coronary artery of native heart without angina pectoris   4. Status post biventricular pacemaker      ASSESSMENT & PLAN:    1.  Pulmonary hypertension denies having swelling of lower extremities I will schedule him to have echocardiogram to assess pulmonary depression. 2.  Paroxysmal atrial fibrillation doing well from that point of view.  She is anticoagulated.  Last EKG showed normal sinus rhythm. 3.  Coronary artery disease doing well from that point review we will continue present management asymptomatic.Marland Kitchen 4.  Status post pacemaker normal function follow-up by our EP team.  COVID-19 Education:  The signs and symptoms of COVID-19 were discussed with the patient and how to seek care for testing (follow up with PCP or arrange E-visit).  The importance of social distancing was discussed today.  Patient Risk:   After full review of this patients clinical status, I feel that they are at least moderate risk at this time.  Time:   Today, I have spent 18 minutes with the patient with telehealth technology discussing pt health issues.  I spent 5 minutes reviewing her chart before the visit.  Visit was finished at 2:53 PM.    Medication Adjustments/Labs and Tests Ordered: Current medicines are reviewed at length with the patient today.  Concerns regarding medicines are outlined above.  No orders of the defined types were placed in this encounter.  Medication changes: No orders of the defined types were placed in this encounter.    Disposition: Follow-up 4 months.  We will make arrangements for echocardiogram to assess pulmonary depression  Signed, Park Liter, MD, [facility] 04/08/2019 2:54 PM    [place]

## 2019-04-08 NOTE — Patient Instructions (Signed)
Medication Instructions:  Your physician recommends that you continue on your current medications as directed. Please refer to the Current Medication list given to you today.   If you need a refill on your cardiac medications before your next appointment, please call your pharmacy.   Lab work: None.  If you have labs (blood work) drawn today and your tests are completely normal, you will receive your results only by: . MyChart Message (if you have MyChart) OR . A paper copy in the mail If you have any lab test that is abnormal or we need to change your treatment, we will call you to review the results.  Testing/Procedures: Your physician has requested that you have an echocardiogram. Echocardiography is a painless test that uses sound waves to create images of your heart. It provides your doctor with information about the size and shape of your heart and how well your heart's chambers and valves are working. This procedure takes approximately one hour. There are no restrictions for this procedure.    Follow-Up: At [facility], you and your health needs are our priority.  As part of our continuing mission to provide you with exceptional heart care, we have created designated Provider Care Teams.  These Care Teams include your primary Cardiologist (physician) and Advanced Practice Providers (APPs -  Physician Assistants and Nurse Practitioners) who all work together to provide you with the care you need, when you need it. You will need a follow up appointment in 4 months.  Please call our office 2 months in advance to schedule this appointment.  You may see No primary care provider on file. or another member of our [facility] Provider Team in [place]: Brian Munley, MD . Rajan Revankar, MD  Any Other Special Instructions Will Be Listed Below (If Applicable).     Echocardiogram An echocardiogram is a procedure that uses painless sound waves (ultrasound) to produce an image of the  heart. Images from an echocardiogram can provide important information about:  Signs of coronary artery disease (CAD).  Aneurysm detection. An aneurysm is a weak or damaged part of an artery wall that bulges out from the normal force of blood pumping through the body.  Heart size and shape. Changes in the size or shape of the heart can be associated with certain conditions, including heart failure, aneurysm, and CAD.  Heart muscle function.  Heart valve function.  Signs of a past heart attack.  Fluid buildup around the heart.  Thickening of the heart muscle.  A tumor or infectious growth around the heart valves. Tell a health care provider about:  Any allergies you have.  All medicines you are taking, including vitamins, herbs, eye drops, creams, and over-the-counter medicines.  Any blood disorders you have.  Any surgeries you have had.  Any medical conditions you have.  Whether you are pregnant or may be pregnant. What are the risks? Generally, this is a safe procedure. However, problems may occur, including:  Allergic reaction to dye (contrast) that may be used during the procedure. What happens before the procedure? No specific preparation is needed. You may eat and drink normally. What happens during the procedure?   An IV tube may be inserted into one of your veins.  You may receive contrast through this tube. A contrast is an injection that improves the quality of the pictures from your heart.  A gel will be applied to your chest.  A wand-like tool (transducer) will be moved over your chest. The gel   transmit the sound waves from the transducer.  The sound waves will harmlessly bounce off of your heart to allow the heart images to be captured in real-time motion. The images will be recorded on a computer. The procedure may vary among health care providers and hospitals. What happens after the procedure?  You may return to your normal, everyday life,  including diet, activities, and medicines, unless your health care provider tells you not to do that. Summary  An echocardiogram is a procedure that uses painless sound waves (ultrasound) to produce an image of the heart.  Images from an echocardiogram can provide important information about the size and shape of your heart, heart muscle function, heart valve function, and fluid buildup around your heart.  You do not need to do anything to prepare before this procedure. You may eat and drink normally.  After the echocardiogram is completed, you may return to your normal, everyday life, unless your health care provider tells you not to do that. This information is not intended to replace advice given to you by your health care provider. Make sure you discuss any questions you have with your health care provider. Document Released: 11/11/2000 Document Revised: 12/17/2016 Document Reviewed: 12/17/2016 Elsevier Interactive Patient Education  2019 Elsevier Inc.    

## 2019-04-24 ENCOUNTER — Other Ambulatory Visit: Payer: Self-pay

## 2019-04-24 ENCOUNTER — Ambulatory Visit (HOSPITAL_BASED_OUTPATIENT_CLINIC_OR_DEPARTMENT_OTHER)
Admission: RE | Admit: 2019-04-24 | Discharge: 2019-04-24 | Disposition: A | Discharge status: Home / Self Care | Payer: Medicare Other | Source: Ambulatory Visit | Attending: Cardiology | Admitting: Cardiology

## 2019-04-24 DIAGNOSIS — Z0000 Encounter for general adult medical examination without abnormal findings: Secondary | ICD-10-CM | POA: Diagnosis not present

## 2019-04-24 DIAGNOSIS — I272 Pulmonary hypertension, unspecified: Secondary | ICD-10-CM

## 2019-04-24 DIAGNOSIS — I10 Essential (primary) hypertension: Secondary | ICD-10-CM | POA: Diagnosis not present

## 2019-04-24 DIAGNOSIS — I2510 Atherosclerotic heart disease of native coronary artery without angina pectoris: Secondary | ICD-10-CM

## 2019-04-24 DIAGNOSIS — I509 Heart failure, unspecified: Secondary | ICD-10-CM | POA: Diagnosis not present

## 2019-04-24 DIAGNOSIS — I480 Paroxysmal atrial fibrillation: Secondary | ICD-10-CM

## 2019-04-24 NOTE — Progress Notes (Signed)
=   Echocardiogram 2D Echocardiogram has been performed.  [name] 04/24/2019, 1:53 PM

## 2019-05-02 ENCOUNTER — Ambulatory Visit (INDEPENDENT_AMBULATORY_CARE_PROVIDER_SITE_OTHER): Payer: Medicare Other | Admitting: *Deleted

## 2019-05-02 DIAGNOSIS — I5022 Chronic systolic (congestive) heart failure: Secondary | ICD-10-CM

## 2019-05-02 DIAGNOSIS — I255 Ischemic cardiomyopathy: Secondary | ICD-10-CM

## 2019-05-02 LAB — CUP PACEART REMOTE DEVICE CHECK
Battery Remaining Longevity: 135 mo
Battery Voltage: 3.02 V
Brady Statistic AP VP Percent: 2.38 %
Brady Statistic AP VS Percent: 0.07 %
Brady Statistic AS VP Percent: 95.61 %
Brady Statistic AS VS Percent: 1.95 %
Brady Statistic RA Percent Paced: 2.44 %
Brady Statistic RV Percent Paced: 0.26 %
Date Time Interrogation Session: 20200604062116
Implantable Lead Implant Date: 20190304
Implantable Lead Implant Date: 20190304
Implantable Lead Implant Date: 20190304
Implantable Lead Location: 753858
Implantable Lead Location: 753859
Implantable Lead Location: 753860
Implantable Lead Model: 4598
Implantable Lead Model: 5076
Implantable Lead Model: 5076
Implantable Pulse Generator Implant Date: 20190304
Lead Channel Impedance Value: 1121 Ohm
Lead Channel Impedance Value: 1140 Ohm
Lead Channel Impedance Value: 1254 Ohm
Lead Channel Impedance Value: 1254 Ohm
Lead Channel Impedance Value: 380 Ohm
Lead Channel Impedance Value: 418 Ohm
Lead Channel Impedance Value: 513 Ohm
Lead Channel Impedance Value: 513 Ohm
Lead Channel Impedance Value: 551 Ohm
Lead Channel Impedance Value: 646 Ohm
Lead Channel Impedance Value: 798 Ohm
Lead Channel Impedance Value: 817 Ohm
Lead Channel Impedance Value: 988 Ohm
Lead Channel Impedance Value: 988 Ohm
Lead Channel Pacing Threshold Amplitude: 0.5 V
Lead Channel Pacing Threshold Amplitude: 0.625 V
Lead Channel Pacing Threshold Amplitude: 1 V
Lead Channel Pacing Threshold Pulse Width: 0.4 ms
Lead Channel Pacing Threshold Pulse Width: 0.4 ms
Lead Channel Pacing Threshold Pulse Width: 0.4 ms
Lead Channel Sensing Intrinsic Amplitude: 1 mV
Lead Channel Sensing Intrinsic Amplitude: 21.625 mV
Lead Channel Setting Pacing Amplitude: 1.5 V
Lead Channel Setting Pacing Amplitude: 1.5 V
Lead Channel Setting Pacing Amplitude: 2 V
Lead Channel Setting Pacing Pulse Width: 0.4 ms
Lead Channel Setting Pacing Pulse Width: 0.4 ms
Lead Channel Setting Sensing Sensitivity: 1.2 mV

## 2019-05-09 NOTE — Progress Notes (Signed)
Remote pacemaker transmission.   

## 2019-05-15 DIAGNOSIS — I10 Essential (primary) hypertension: Secondary | ICD-10-CM | POA: Diagnosis not present

## 2019-05-15 DIAGNOSIS — R5383 Other fatigue: Secondary | ICD-10-CM | POA: Diagnosis not present

## 2019-05-15 DIAGNOSIS — I509 Heart failure, unspecified: Secondary | ICD-10-CM | POA: Diagnosis not present

## 2019-05-20 ENCOUNTER — Other Ambulatory Visit (HOSPITAL_COMMUNITY): Payer: Self-pay | Admitting: Internal Medicine

## 2019-08-01 ENCOUNTER — Ambulatory Visit (INDEPENDENT_AMBULATORY_CARE_PROVIDER_SITE_OTHER): Payer: Medicare Other | Admitting: *Deleted

## 2019-08-01 DIAGNOSIS — I5022 Chronic systolic (congestive) heart failure: Secondary | ICD-10-CM

## 2019-08-01 LAB — CUP PACEART REMOTE DEVICE CHECK
Battery Remaining Longevity: 109 mo
Battery Voltage: 3.01 V
Brady Statistic AP VP Percent: 3.53 %
Brady Statistic AP VS Percent: 0.09 %
Brady Statistic AS VP Percent: 94.4 %
Brady Statistic AS VS Percent: 1.97 %
Brady Statistic RA Percent Paced: 3.65 %
Brady Statistic RV Percent Paced: 0.4 %
Date Time Interrogation Session: 20200903061805
Implantable Lead Implant Date: 20190304
Implantable Lead Implant Date: 20190304
Implantable Lead Implant Date: 20190304
Implantable Lead Location: 753858
Implantable Lead Location: 753859
Implantable Lead Location: 753860
Implantable Lead Model: 4598
Implantable Lead Model: 5076
Implantable Lead Model: 5076
Implantable Pulse Generator Implant Date: 20190304
Lead Channel Impedance Value: 1045 Ohm
Lead Channel Impedance Value: 1064 Ohm
Lead Channel Impedance Value: 1197 Ohm
Lead Channel Impedance Value: 1216 Ohm
Lead Channel Impedance Value: 1330 Ohm
Lead Channel Impedance Value: 1330 Ohm
Lead Channel Impedance Value: 380 Ohm
Lead Channel Impedance Value: 437 Ohm
Lead Channel Impedance Value: 513 Ohm
Lead Channel Impedance Value: 532 Ohm
Lead Channel Impedance Value: 589 Ohm
Lead Channel Impedance Value: 703 Ohm
Lead Channel Impedance Value: 836 Ohm
Lead Channel Impedance Value: 874 Ohm
Lead Channel Pacing Threshold Amplitude: 0.625 V
Lead Channel Pacing Threshold Amplitude: 0.75 V
Lead Channel Pacing Threshold Amplitude: 1.875 V
Lead Channel Pacing Threshold Pulse Width: 0.4 ms
Lead Channel Pacing Threshold Pulse Width: 0.4 ms
Lead Channel Pacing Threshold Pulse Width: 0.4 ms
Lead Channel Sensing Intrinsic Amplitude: 1 mV
Lead Channel Sensing Intrinsic Amplitude: 1 mV
Lead Channel Sensing Intrinsic Amplitude: 17.5 mV
Lead Channel Sensing Intrinsic Amplitude: 17.5 mV
Lead Channel Setting Pacing Amplitude: 1.5 V
Lead Channel Setting Pacing Amplitude: 2 V
Lead Channel Setting Pacing Amplitude: 3 V
Lead Channel Setting Pacing Pulse Width: 0.4 ms
Lead Channel Setting Pacing Pulse Width: 0.4 ms
Lead Channel Setting Sensing Sensitivity: 1.2 mV

## 2019-08-07 ENCOUNTER — Encounter: Payer: Self-pay | Admitting: Cardiology

## 2019-08-07 ENCOUNTER — Other Ambulatory Visit: Payer: Self-pay

## 2019-08-07 ENCOUNTER — Telehealth (INDEPENDENT_AMBULATORY_CARE_PROVIDER_SITE_OTHER): Payer: Medicare Other | Admitting: Cardiology

## 2019-08-07 VITALS — HR 64

## 2019-08-07 DIAGNOSIS — I480 Paroxysmal atrial fibrillation: Secondary | ICD-10-CM

## 2019-08-07 DIAGNOSIS — R0602 Shortness of breath: Secondary | ICD-10-CM | POA: Diagnosis not present

## 2019-08-07 DIAGNOSIS — I447 Left bundle-branch block, unspecified: Secondary | ICD-10-CM | POA: Diagnosis not present

## 2019-08-07 DIAGNOSIS — E039 Hypothyroidism, unspecified: Secondary | ICD-10-CM | POA: Diagnosis not present

## 2019-08-07 DIAGNOSIS — R5383 Other fatigue: Secondary | ICD-10-CM | POA: Diagnosis not present

## 2019-08-07 DIAGNOSIS — I119 Hypertensive heart disease without heart failure: Secondary | ICD-10-CM | POA: Diagnosis not present

## 2019-08-07 DIAGNOSIS — R531 Weakness: Secondary | ICD-10-CM

## 2019-08-07 DIAGNOSIS — R6889 Other general symptoms and signs: Secondary | ICD-10-CM

## 2019-08-07 NOTE — Addendum Note (Signed)
Addended by: Aleatha Borer on: 08/07/2019 04:40 PM   Modules accepted: Orders

## 2019-08-07 NOTE — Progress Notes (Signed)
Virtual Visit via Telephone Note   This visit type was conducted due to national recommendations for restrictions regarding the COVID-19 Pandemic (e.g. social distancing) in an effort to limit this patient's exposure and mitigate transmission in our community.  Due to her co-morbid illnesses, this patient is at least at moderate risk for complications without adequate follow up.  This format is felt to be most appropriate for this patient at this time.  The patient did not have access to video technology/had technical difficulties with video requiring transitioning to audio format only (telephone).  All issues noted in this document were discussed and addressed.  No physical exam could be performed with this format.  Please refer to the patient's chart for her  consent to telehealth for [facility].  Evaluation Performed:  Follow-up visit  This visit type was conducted due to national recommendations for restrictions regarding the COVID-19 Pandemic (e.g. social distancing).  This format is felt to be most appropriate for this patient at this time.  All issues noted in this document were discussed and addressed.  No physical exam was performed (except for noted visual exam findings with Video Visits).  Please refer to the patient's chart (MyChart message for video visits and phone note for telephone visits) for the patient's consent to telehealth for [facility].  Date:  08/07/2019  ID: [name], [name] [birth year], MRN [id number]   Patient Location: [address] [place] [address]   Provider location:   [place] Office  PCP:  Fanny Bien, MD  Cardiologist:  Jenne Campus, MD     Chief Complaint: I am still very weak and tired  History of Present Illness:    [name] is a 85 y.o. female  who presents via audio/video conferencing for a telehealth visit today.  With coronary artery disease status post PTCA and stenting of mid LAD in 2003,  last cardiac cath in 2004 showing patent stent.  Also pacemaker implantation, congestive heart failure however last echocardiogram showed preserved ejection fraction, left bundle branch block.  She does have a virtual visit with me today.  She is unable to establish video link therefore we talking over the phone.  She complained of having weakness fatigue and shortness of breath.  So far I see no cardiac reason for his symptoms.  Pacemaker was interrogated functioning properly echocardiogram showed preserved left ventricular ejection fraction.   The patient does not have symptoms concerning for COVID-19 infection (fever, chills, cough, or new SHORTNESS OF BREATH).    Prior CV studies:   The following studies were reviewed today:  Echocardiogram done a few months ago showed:   1. The left ventricle has normal systolic function with an ejection fraction of 60-65%. The cavity size was normal. There is mild concentric left ventricular hypertrophy. Left ventricular diastolic Doppler parameters are consistent with impaired  relaxation.  2. The right ventricle has normal systolic function. The cavity was normal. There is no increase in right ventricular wall thickness.  3. The mitral valve is degenerative. Mild calcification of the mitral valve leaflet. There is mild to moderate mitral annular calcification present. No evidence of mitral valve stenosis.  4. The aortic valve is tricuspid. Mild thickening of the aortic valve. Aortic valve regurgitation is mild to moderate by color flow Doppler. No stenosis of the aortic valve.  5. The aortic root, ascending aorta and aortic arch are normal in size and structure.  Past Medical History:  Diagnosis Date  . Anxiety    "some; not treated for it" (11/27/2014)  . Bradycardia   . CAD (coronary artery disease), native coronary artery    2003 normal left main, 80% mid LAD  2.5 x 13 mm Cypher stent to LAD Dr. Rex Kras, subsequent cath 2004 showed patent  stent in LAD with no sig disease in RCA or Circ;    . CHF (congestive heart failure) ([place])   . CKD (chronic kidney disease), stage III ([place])   . GERD (gastroesophageal reflux disease)   . History of hiatal hernia   . Hyperlipidemia   . Hypertension   . LBBB (left bundle branch block)   . Migraine    "very rare now" (11/27/2014)  . Obesity (BMI 30-39.9) 06/18/2017  . On home oxygen therapy    "only at night; ? setting" (11/27/2014)  . Osteoarthritis cervical spine   . Sleep apnea 06/18/2017    Past Surgical History:  Procedure Laterality Date  . ABDOMINAL HYSTERECTOMY    . BIV PACEMAKER INSERTION CRT-P N/A 01/29/2018   Procedure: BIV PACEMAKER INSERTION CRT-P;  Surgeon: Evans Lance, MD;  Location: [place] [facility];  Service: Cardiovascular;  Laterality: N/A;  . CARDIOVERSION N/A 12/05/2017   Procedure: CARDIOVERSION;  Surgeon: Jolaine Artist, MD;  Location: [facility];  Service: Cardiovascular;  Laterality: N/A;  . CATARACT EXTRACTION W/ INTRAOCULAR LENS  IMPLANT, BILATERAL Bilateral   . CYSTOCELE REPAIR       Current Meds  Medication Sig  . acetaminophen (TYLENOL) 325 MG tablet Take 2 tablets (650 mg total) by mouth every 6 (six) hours as needed for mild pain (or Fever >/= 101).  Marland Kitchen amiodarone (PACERONE) 200 MG tablet TAKE 1 TABLET BY MOUTH DAILY  . apixaban (ELIQUIS) 2.5 MG TABS tablet Take 1 tablet (2.5 mg total) by mouth 2 (two) times daily.  Marland Kitchen atorvastatin (LIPITOR) 80 MG tablet Take 1 tablet (80 mg total) by mouth daily.  . butalbital-acetaminophen-caffeine (FIORICET, ESGIC) 50-325-40 MG tablet Take 1 tablet by mouth every 4 (four) hours as needed for headache or migraine.  . fexofenadine (ALLEGRA) 180 MG tablet Take 180 mg by mouth daily.  . furosemide (LASIX) 40 MG tablet TAKE 2 TABLETS BY MOUTH DAILY  . levothyroxine (SYNTHROID, LEVOTHROID) 125 MCG tablet Take 125 mcg by mouth daily before breakfast.  . metoprolol tartrate (LOPRESSOR) 25 MG tablet Take 25 mg by  mouth 2 (two) times daily.  Marland Kitchen omeprazole (PRILOSEC OTC) 20 MG tablet Take 20 mg by mouth daily.      Family History: The patient's family history includes Heart Problems in her mother; Heart attack in her father and mother; Hypertension in her father and mother.   ROS:   Please see the history of present illness.     All other systems reviewed and are negative.   Labs/Other Tests and Data Reviewed:     Recent Labs: 09/14/2018: Magnesium 2.0 09/15/2018: ALT 17 10/03/2018: Hemoglobin 13.1; Platelets 335 03/07/2019: BUN 16; Creatinine, Ser 1.26; NT-Pro BNP 227; Potassium 3.5; Sodium 144  Recent Lipid Panel No results found for: CHOL, TRIG, HDL, CHOLHDL, VLDL, LDLCALC, LDLDIRECT    Exam:    Vital Signs:  Pulse 64   SpO2 95%     Wt Readings from Last 3 Encounters:  03/07/19 195 lb 3.2 oz (88.5 kg)  12/19/18 185 lb (83.9 kg)  10/03/18 188 lb 12.8 oz (85.6 kg)     Well nourished, well developed in no acute  distress. Alert oriented x3 talking to me over the phone happy to be able to have a conversation not in any distress  Diagnosis for this visit:   1. Paroxysmal atrial fibrillation (HCC)   2. Hypertensive heart disease without CHF   3. LBBB (left bundle branch block)   4. Hypothyroidism, unspecified type      ASSESSMENT & PLAN:    1.  Paroxysmal atrial fibrillation denies having any palpitations.  Continue present management 2.  Hypertension blood pressure well controlled.  Continue present management. 3.  Left bundle branch block chronic noted 4.  Profound fatigue shortness of breath so far no cardiac explanation for his symptoms we will do complete metabolic panel TSH CBC as well as vitamin B12.  COVID-19 Education: The signs and symptoms of COVID-19 were discussed with the patient and how to seek care for testing (follow up with PCP or arrange E-visit).  The importance of social distancing was discussed today.  Patient Risk:   After full review of this patients  clinical status, I feel that they are at least moderate risk at this time.  Time:   Today, I have spent 15 minutes with the patient with telehealth technology discussing pt health issues.  I spent 5 minutes reviewing her chart before the visit.  Visit was finished at 338.    Medication Adjustments/Labs and Tests Ordered: Current medicines are reviewed at length with the patient today.  Concerns regarding medicines are outlined above.  No orders of the defined types were placed in this encounter.  Medication changes: No orders of the defined types were placed in this encounter.    Disposition: Follow-up 3 months  Signed, Park Liter, MD, [facility] 08/07/2019 3:36 PM    [place]

## 2019-08-07 NOTE — Patient Instructions (Signed)
Medication Instructions:  Your physician recommends that you continue on your current medications as directed. Please refer to the Current Medication list given to you today.  If you need a refill on your cardiac medications before your next appointment, please call your pharmacy.   Lab work: Your physician recommends that you return for lab work in the next few days: CMP, TSH, CBC and B12  If you have labs (blood work) drawn today and your tests are completely normal, you will receive your results only by: Marland Kitchen MyChart Message (if you have MyChart) OR . A paper copy in the mail If you have any lab test that is abnormal or we need to change your treatment, we will call you to review the results.  Testing/Procedures: None ordered  Follow-Up: At [facility], you and your health needs are our priority.  As part of our continuing mission to provide you with exceptional heart care, we have created designated Provider Care Teams.  These Care Teams include your primary Cardiologist (physician) and Advanced Practice Providers (APPs -  Physician Assistants and Nurse Practitioners) who all work together to provide you with the care you need, when you need it. You will need a follow up appointment in 3 months.  You may see Jenne Campus or another member of our [employer] Provider Team in [place]: Shirlee More, MD . Jyl Heinz, MD

## 2019-08-15 ENCOUNTER — Encounter: Payer: Self-pay | Admitting: Cardiology

## 2019-08-15 NOTE — Progress Notes (Signed)
Remote pacemaker transmission.   

## 2019-08-23 DIAGNOSIS — E039 Hypothyroidism, unspecified: Secondary | ICD-10-CM | POA: Diagnosis not present

## 2019-08-23 DIAGNOSIS — E785 Hyperlipidemia, unspecified: Secondary | ICD-10-CM | POA: Diagnosis not present

## 2019-08-23 DIAGNOSIS — I10 Essential (primary) hypertension: Secondary | ICD-10-CM | POA: Diagnosis not present

## 2019-08-23 DIAGNOSIS — N289 Disorder of kidney and ureter, unspecified: Secondary | ICD-10-CM | POA: Diagnosis not present

## 2019-09-16 ENCOUNTER — Other Ambulatory Visit (HOSPITAL_COMMUNITY): Payer: Self-pay | Admitting: Internal Medicine

## 2019-09-16 ENCOUNTER — Other Ambulatory Visit: Payer: Self-pay | Admitting: Cardiology

## 2019-09-27 DIAGNOSIS — I10 Essential (primary) hypertension: Secondary | ICD-10-CM | POA: Diagnosis not present

## 2019-09-27 DIAGNOSIS — N289 Disorder of kidney and ureter, unspecified: Secondary | ICD-10-CM | POA: Diagnosis not present

## 2019-09-27 DIAGNOSIS — I519 Heart disease, unspecified: Secondary | ICD-10-CM | POA: Diagnosis not present

## 2019-09-27 DIAGNOSIS — E039 Hypothyroidism, unspecified: Secondary | ICD-10-CM | POA: Diagnosis not present

## 2019-09-27 DIAGNOSIS — E782 Mixed hyperlipidemia: Secondary | ICD-10-CM | POA: Diagnosis not present

## 2019-10-02 DIAGNOSIS — R404 Transient alteration of awareness: Secondary | ICD-10-CM | POA: Diagnosis not present

## 2019-10-17 IMAGING — DX DG CHEST 2V
2 series · 2 of 2 positions shown · non-contrast
Comparison: 11/24/2017

CLINICAL DATA: Acute respiratory failure

EXAM:
CHEST  2 VIEW

[x chest ap]
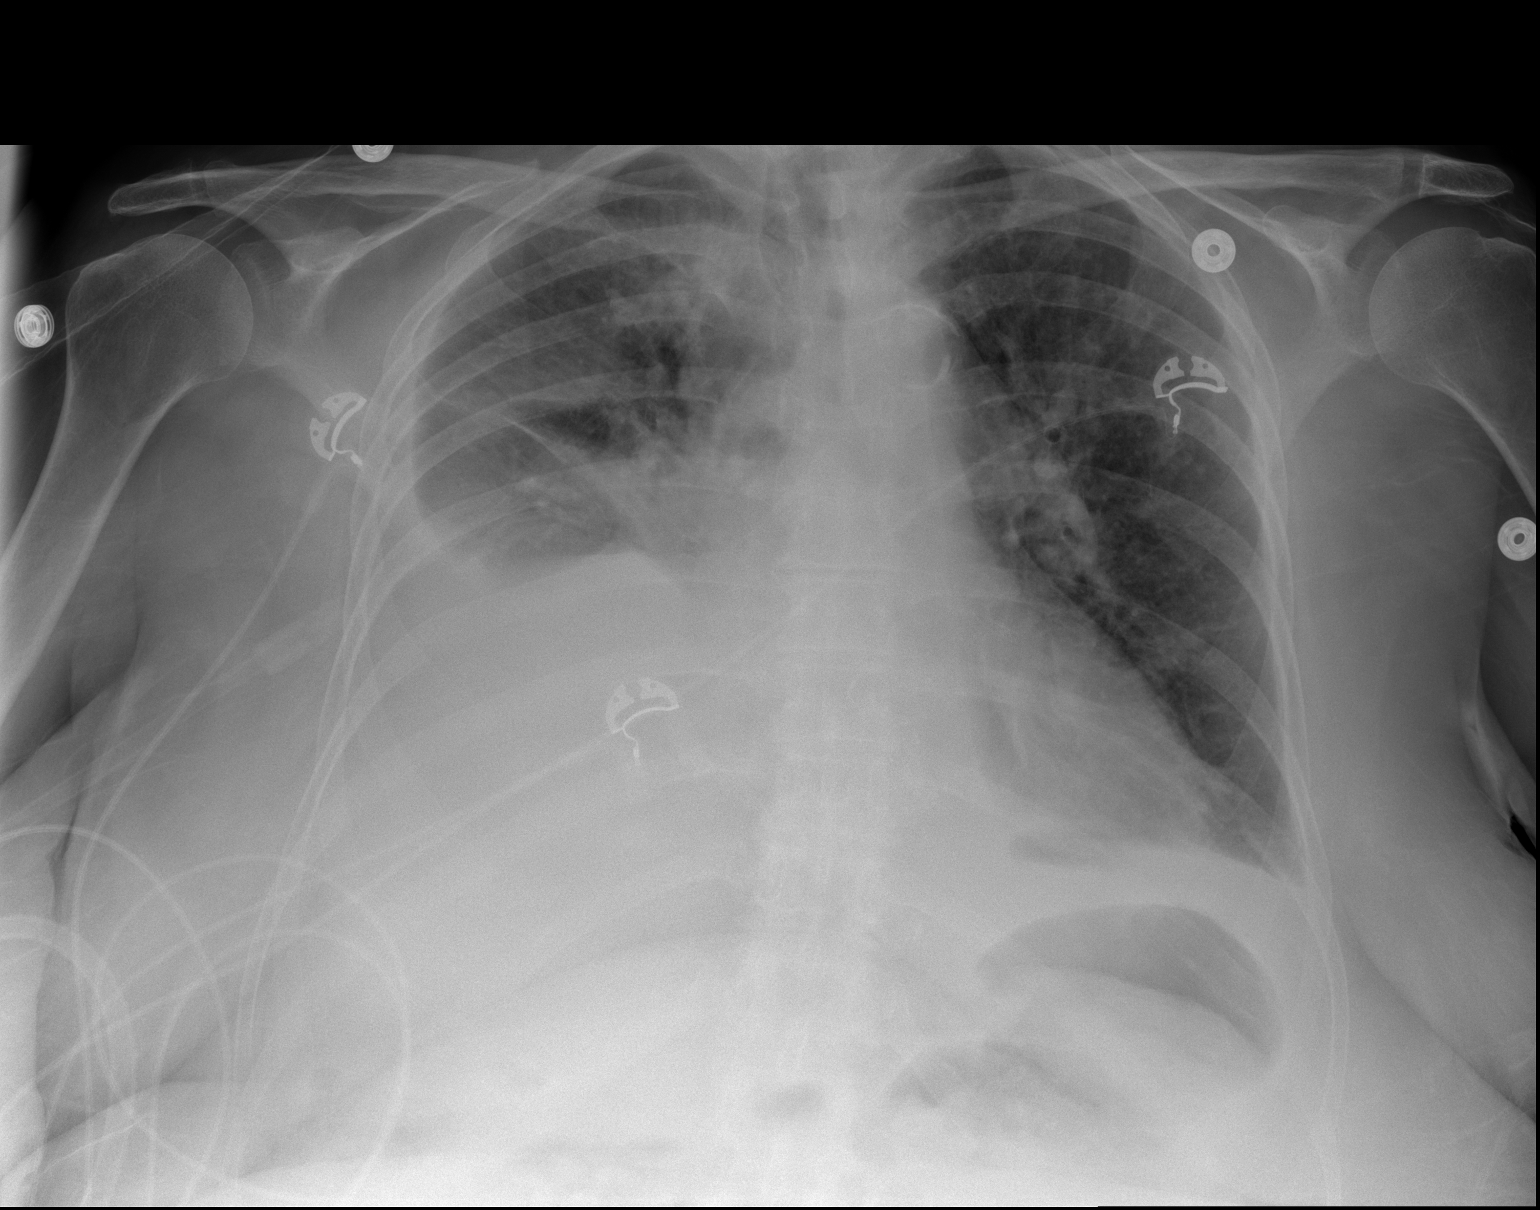

[w chest lat]
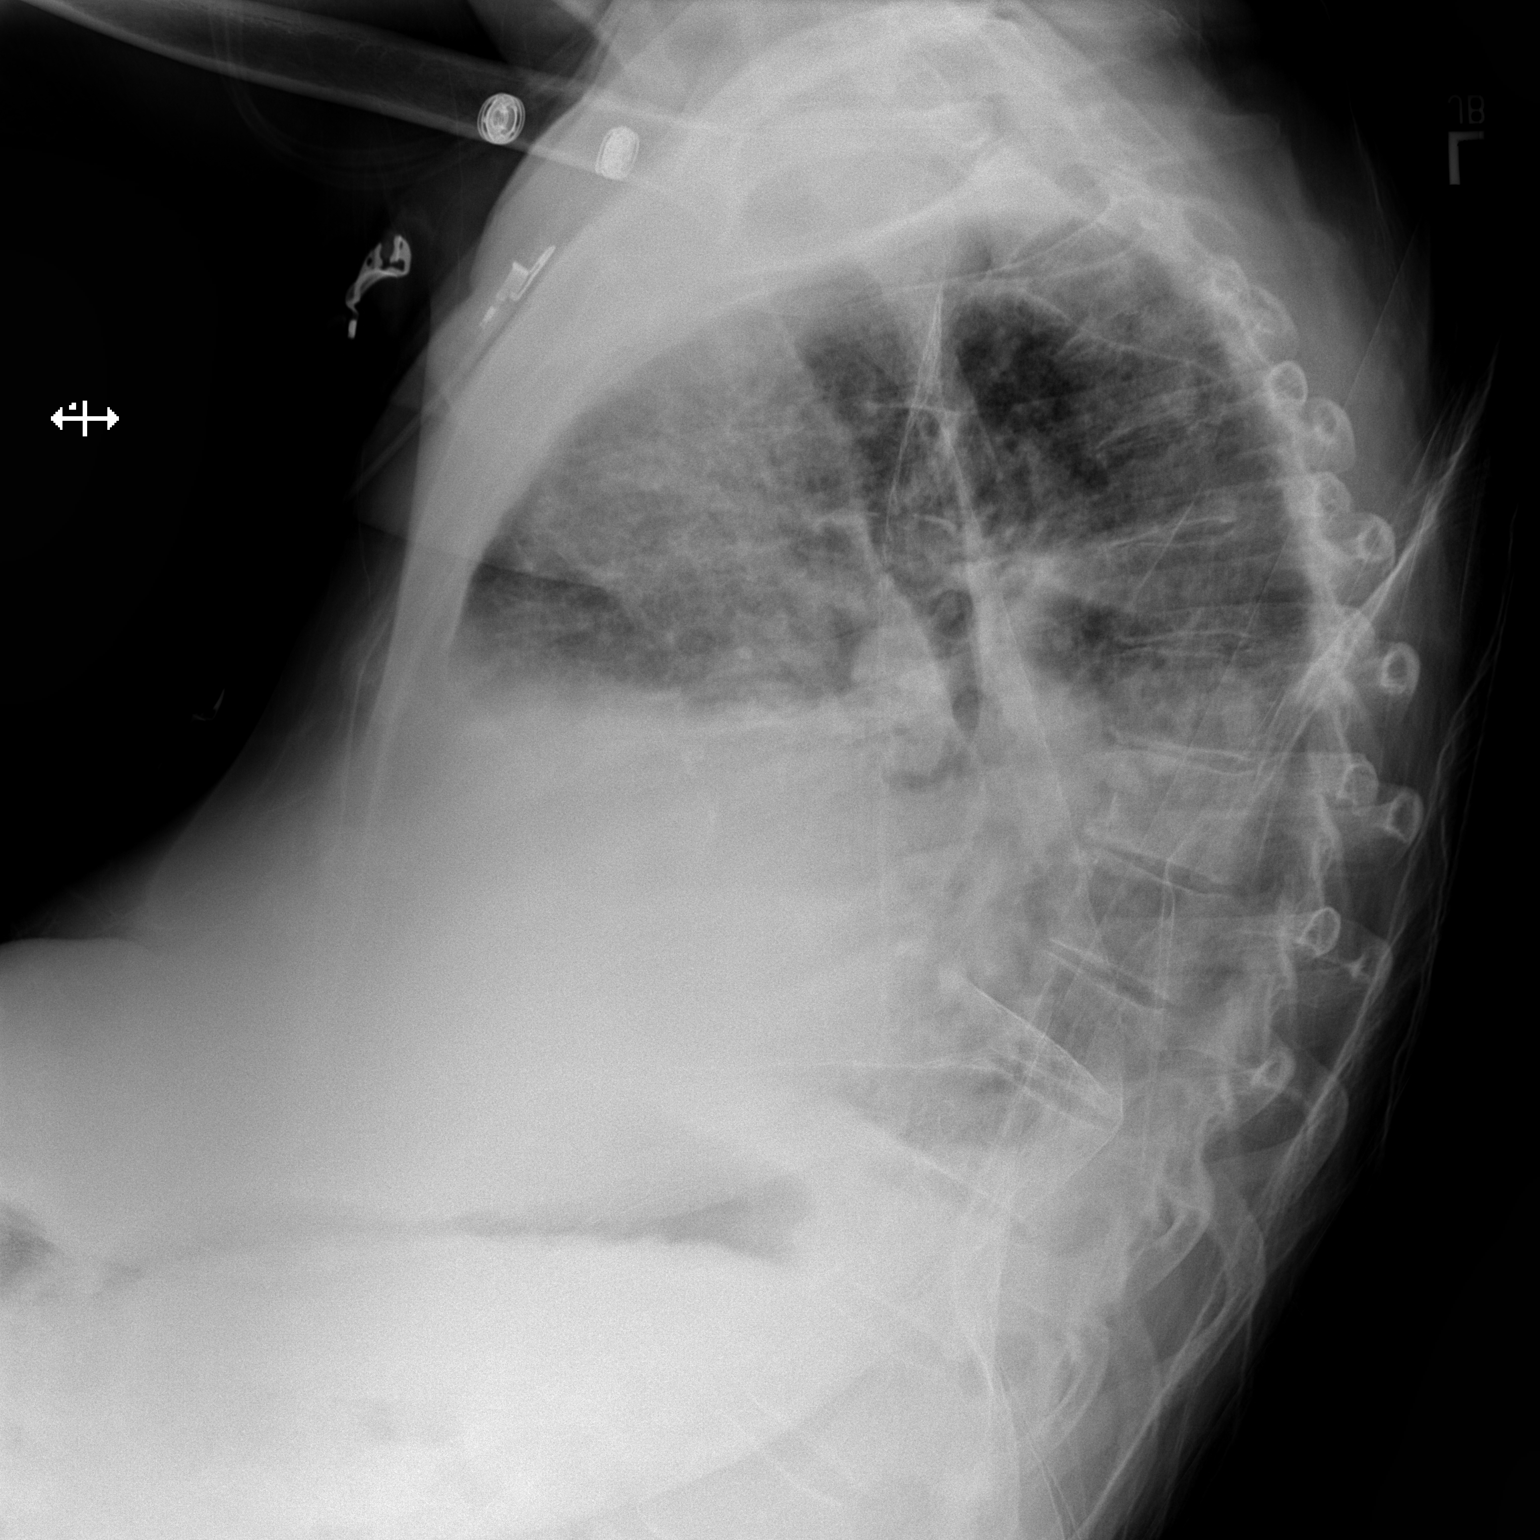

[2 of 2 positions shown; findings below may reference images not displayed]

FINDINGS: Moderate right pleural effusion has increased. Patchy opacities in
the visualized right upper lung zone are present. Subsegmental
atelectasis at the left base. Normal heart size. Low volumes. No
pneumothorax.
IMPRESSION: Increasing moderate right pleural effusion.

Increasing patchy opacities in the visualize right upper lobe.

## 2019-10-29 ENCOUNTER — Ambulatory Visit: Payer: Medicare Other | Admitting: Cardiology

## 2019-10-29 DIAGNOSIS — 419620001 Death: Secondary | SNOMED CT | POA: Diagnosis not present

## 2019-10-29 DEATH — deceased
